# Patient Record
Sex: Female | Born: 1945 | Race: White | Hispanic: No | Marital: Married | Smoking: Never smoker
Health system: Southern US, Community
[De-identification: ages and names within clinical notes are randomized; demographics above are authoritative.]

## PROBLEM LIST (undated history)

## (undated) DIAGNOSIS — K219 Gastro-esophageal reflux disease without esophagitis: Secondary | ICD-10-CM

## (undated) DIAGNOSIS — I10 Essential (primary) hypertension: Secondary | ICD-10-CM

## (undated) DIAGNOSIS — R42 Dizziness and giddiness: Secondary | ICD-10-CM

## (undated) DIAGNOSIS — C801 Malignant (primary) neoplasm, unspecified: Secondary | ICD-10-CM

## (undated) DIAGNOSIS — H9190 Unspecified hearing loss, unspecified ear: Secondary | ICD-10-CM

## (undated) DIAGNOSIS — M1990 Unspecified osteoarthritis, unspecified site: Secondary | ICD-10-CM

## (undated) DIAGNOSIS — J302 Other seasonal allergic rhinitis: Secondary | ICD-10-CM

## (undated) DIAGNOSIS — K52839 Microscopic colitis, unspecified: Secondary | ICD-10-CM

## (undated) DIAGNOSIS — E785 Hyperlipidemia, unspecified: Secondary | ICD-10-CM

## (undated) DIAGNOSIS — J339 Nasal polyp, unspecified: Secondary | ICD-10-CM

## (undated) DIAGNOSIS — E109 Type 1 diabetes mellitus without complications: Secondary | ICD-10-CM

## (undated) DIAGNOSIS — K2270 Barrett's esophagus without dysplasia: Secondary | ICD-10-CM

## (undated) DIAGNOSIS — Z974 Presence of external hearing-aid: Secondary | ICD-10-CM

## (undated) DIAGNOSIS — IMO0001 Reserved for inherently not codable concepts without codable children: Secondary | ICD-10-CM

## (undated) DIAGNOSIS — N941 Unspecified dyspareunia: Secondary | ICD-10-CM

## (undated) DIAGNOSIS — Z780 Asymptomatic menopausal state: Secondary | ICD-10-CM

## (undated) DIAGNOSIS — R319 Hematuria, unspecified: Secondary | ICD-10-CM

## (undated) DIAGNOSIS — J329 Chronic sinusitis, unspecified: Secondary | ICD-10-CM

## (undated) HISTORY — PX: COLONOSCOPY: SHX174

## (undated) HISTORY — PX: HERNIA REPAIR: SHX51

## (undated) HISTORY — PX: TONSILLECTOMY: SUR1361

## (undated) HISTORY — PX: TUBAL LIGATION: SHX77

## (undated) HISTORY — PX: EYE SURGERY: SHX253

## (undated) HISTORY — PX: CATARACT EXTRACTION: SUR2

## (undated) HISTORY — PX: JOINT REPLACEMENT: SHX530

---

## 2003-12-03 ENCOUNTER — Ambulatory Visit: Payer: Self-pay | Admitting: Internal Medicine

## 2004-01-23 ENCOUNTER — Emergency Department: Payer: Self-pay | Admitting: Emergency Medicine

## 2004-03-15 ENCOUNTER — Ambulatory Visit: Payer: Self-pay

## 2004-12-28 ENCOUNTER — Ambulatory Visit: Payer: Self-pay | Admitting: Internal Medicine

## 2005-02-02 ENCOUNTER — Ambulatory Visit: Payer: Self-pay | Admitting: Ophthalmology

## 2005-12-29 ENCOUNTER — Ambulatory Visit: Payer: Self-pay | Admitting: Internal Medicine

## 2006-06-30 ENCOUNTER — Ambulatory Visit: Payer: Self-pay | Admitting: Internal Medicine

## 2007-02-01 ENCOUNTER — Ambulatory Visit: Payer: Self-pay | Admitting: Internal Medicine

## 2007-03-21 ENCOUNTER — Ambulatory Visit: Payer: Self-pay | Admitting: Unknown Physician Specialty

## 2007-11-13 DIAGNOSIS — C4491 Basal cell carcinoma of skin, unspecified: Secondary | ICD-10-CM

## 2007-11-13 HISTORY — DX: Basal cell carcinoma of skin, unspecified: C44.91

## 2008-04-22 ENCOUNTER — Ambulatory Visit: Payer: Self-pay | Admitting: Internal Medicine

## 2009-04-07 DIAGNOSIS — D239 Other benign neoplasm of skin, unspecified: Secondary | ICD-10-CM

## 2009-04-07 HISTORY — DX: Other benign neoplasm of skin, unspecified: D23.9

## 2009-04-23 ENCOUNTER — Ambulatory Visit: Payer: Self-pay | Admitting: Internal Medicine

## 2010-04-26 ENCOUNTER — Ambulatory Visit: Payer: Self-pay | Admitting: Internal Medicine

## 2011-05-03 ENCOUNTER — Ambulatory Visit: Payer: Self-pay | Admitting: Internal Medicine

## 2012-05-03 ENCOUNTER — Ambulatory Visit: Payer: Self-pay

## 2012-05-18 ENCOUNTER — Ambulatory Visit: Payer: Self-pay

## 2012-05-25 ENCOUNTER — Ambulatory Visit: Payer: Self-pay

## 2012-06-22 ENCOUNTER — Ambulatory Visit: Payer: Self-pay | Admitting: Unknown Physician Specialty

## 2012-12-11 ENCOUNTER — Ambulatory Visit: Payer: Self-pay | Admitting: Anesthesiology

## 2012-12-12 ENCOUNTER — Ambulatory Visit: Payer: Self-pay | Admitting: Otolaryngology

## 2013-07-03 ENCOUNTER — Ambulatory Visit: Payer: Self-pay

## 2014-07-11 ENCOUNTER — Ambulatory Visit: Payer: Medicare Other | Admitting: Anesthesiology

## 2014-07-11 ENCOUNTER — Ambulatory Visit
Admission: RE | Admit: 2014-07-11 | Discharge: 2014-07-11 | Disposition: A | Discharge status: Home / Self Care | Payer: Medicare Other | Source: Ambulatory Visit | Attending: Unknown Physician Specialty | Admitting: Unknown Physician Specialty

## 2014-07-11 ENCOUNTER — Encounter
Admission: RE | Disposition: A | Discharge status: Home / Self Care | Payer: Self-pay | Source: Ambulatory Visit | Attending: Unknown Physician Specialty

## 2014-07-11 ENCOUNTER — Encounter: Payer: Self-pay | Admitting: *Deleted

## 2014-07-11 DIAGNOSIS — K319 Disease of stomach and duodenum, unspecified: Secondary | ICD-10-CM | POA: Insufficient documentation

## 2014-07-11 DIAGNOSIS — E109 Type 1 diabetes mellitus without complications: Secondary | ICD-10-CM | POA: Insufficient documentation

## 2014-07-11 DIAGNOSIS — K2270 Barrett's esophagus without dysplasia: Secondary | ICD-10-CM | POA: Insufficient documentation

## 2014-07-11 DIAGNOSIS — K2980 Duodenitis without bleeding: Secondary | ICD-10-CM | POA: Diagnosis not present

## 2014-07-11 DIAGNOSIS — Z794 Long term (current) use of insulin: Secondary | ICD-10-CM | POA: Insufficient documentation

## 2014-07-11 HISTORY — PX: ESOPHAGOGASTRODUODENOSCOPY: SHX5428

## 2014-07-11 LAB — GLUCOSE, CAPILLARY: Glucose-Capillary: 213 mg/dL — ABNORMAL HIGH (ref 65–99)

## 2014-07-11 SURGERY — EGD (ESOPHAGOGASTRODUODENOSCOPY)
Anesthesia: General

## 2014-07-11 MED ORDER — LIDOCAINE HCL (PF) 2 % IJ SOLN
INTRAMUSCULAR | Status: DC | PRN
  Administered 2014-07-11: 50 mg

## 2014-07-11 MED ORDER — SODIUM CHLORIDE 0.9 % IV SOLN
INTRAVENOUS | Status: DC
  Administered 2014-07-11: 1000 mL via INTRAVENOUS

## 2014-07-11 MED ORDER — PROPOFOL 10 MG/ML IV BOLUS
INTRAVENOUS | Status: DC | PRN
  Administered 2014-07-11: 50 mg via INTRAVENOUS

## 2014-07-11 MED ORDER — EPHEDRINE SULFATE 50 MG/ML IJ SOLN
INTRAMUSCULAR | Status: DC | PRN
  Administered 2014-07-11: 10 mg via INTRAVENOUS
  Administered 2014-07-11: 15 mg via INTRAVENOUS

## 2014-07-11 MED ORDER — GLYCOPYRROLATE 0.2 MG/ML IJ SOLN
INTRAMUSCULAR | Status: DC | PRN
  Administered 2014-07-11: 0.2 mg via INTRAVENOUS

## 2014-07-11 MED ORDER — PROPOFOL INFUSION 10 MG/ML OPTIME
INTRAVENOUS | Status: DC | PRN
  Administered 2014-07-11: 150 ug/kg/min via INTRAVENOUS

## 2014-07-11 MED ORDER — FENTANYL CITRATE (PF) 100 MCG/2ML IJ SOLN
INTRAMUSCULAR | Status: DC | PRN
  Administered 2014-07-11: 50 ug via INTRAVENOUS

## 2014-07-11 MED ORDER — SODIUM CHLORIDE 0.9 % IV SOLN: INTRAVENOUS | Status: DC

## 2014-07-11 MED ORDER — ESMOLOL HCL 10 MG/ML IV SOLN
INTRAVENOUS | Status: DC | PRN
  Administered 2014-07-11: 20 ug via INTRAVENOUS

## 2014-07-11 NOTE — Anesthesia Postprocedure Evaluation (Signed)
  Anesthesia Post-op Note  Patient: Cassandra Joyce  Procedure(s) Performed: Procedure(s): ESOPHAGOGASTRODUODENOSCOPY (EGD) (N/A)  Anesthesia type:General  Patient location: PACU  Post pain: Pain level controlled  Post assessment: Post-op Vital signs reviewed, Patient's Cardiovascular Status Stable, Respiratory Function Stable, Patent Airway and No signs of Nausea or vomiting  Post vital signs: Reviewed and stable  Last Vitals:  Filed Vitals:   07/11/14 0901  BP: 174/78  Pulse: 116  Temp: 35.9 C  Resp: 12    Level of consciousness: awake, alert  and patient cooperative  Complications: No apparent anesthesia complications

## 2014-07-11 NOTE — Anesthesia Preprocedure Evaluation (Signed)
Anesthesia Evaluation  Patient identified by MRN, date of birth, ID band Patient awake    Reviewed: Allergy & Precautions, NPO status , Patient's Chart, lab work & pertinent test results  Airway Mallampati: II  TM Distance: >3 FB Neck ROM: Limited    Dental  (+) Teeth Intact   Pulmonary  breath sounds clear to auscultation  Pulmonary exam normal       Cardiovascular Exercise Tolerance: Good Normal cardiovascular examRhythm:Regular Rate:Normal     Neuro/Psych    GI/Hepatic Hx Barret's esophagus.   Endo/Other  diabetes, Well Controlled, Type 1, Insulin Dependent  Renal/GU      Musculoskeletal   Abdominal (+)  Abdomen: soft.    Peds  Hematology   Anesthesia Other Findings   Reproductive/Obstetrics                             Anesthesia Physical Anesthesia Plan  ASA: IV  Anesthesia Plan: General   Post-op Pain Management:    Induction: Intravenous  Airway Management Planned: Nasal Cannula  Additional Equipment:   Intra-op Plan:   Post-operative Plan:   Informed Consent: I have reviewed the patients History and Physical, chart, labs and discussed the procedure including the risks, benefits and alternatives for the proposed anesthesia with the patient or authorized representative who has indicated his/her understanding and acceptance.     Plan Discussed with: CRNA  Anesthesia Plan Comments:         Anesthesia Quick Evaluation

## 2014-07-11 NOTE — H&P (Signed)
Primary Care Physician:  Adrian Prows, MD Primary Gastroenterologist:  Dr. Vira Agar  Pre-Procedure History & Physical: HPI:  Cassandra Joyce is a 69 y.o. female is here for an endoscopy.   Past Medical History  Diagnosis Date  . Diabetes mellitus without complication     No past surgical history on file.  Prior to Admission medications   Medication Sig Start Date End Date Taking? Authorizing Provider  cholecalciferol (VITAMIN D) 400 UNITS TABS tablet Take 1,000 Units by mouth daily.   Yes Historical Provider, MD  enalapril (VASOTEC) 2.5 MG tablet Take 2.5 mg by mouth.   Yes Historical Provider, MD  fluticasone (FLONASE) 50 MCG/ACT nasal spray Place 2 sprays into both nostrils daily.   Yes Historical Provider, MD  insulin lispro (HUMALOG) 100 UNIT/ML injection Inject 35 Units into the skin once.   Yes Historical Provider, MD  omeprazole (PRILOSEC) 40 MG capsule Take 40 mg by mouth daily.   Yes Historical Provider, MD  simvastatin (ZOCOR) 40 MG tablet Take 40 mg by mouth daily.   Yes Historical Provider, MD    Allergies as of 05/26/2014  . (Not on File)    No family history on file.  History   Social History  . Marital Status: Married    Spouse Name: N/A  . Number of Children: N/A  . Years of Education: N/A   Occupational History  . Not on file.   Social History Main Topics  . Smoking status: Not on file  . Smokeless tobacco: Not on file  . Alcohol Use: Not on file  . Drug Use: Not on file  . Sexual Activity: Not on file   Other Topics Concern  . Not on file   Social History Narrative  . No narrative on file    Review of Systems: See HPI, otherwise negative ROS  Physical Exam: BP 144/90 mmHg  Pulse 59  Temp(Src) 96.9 F (36.1 C) (Tympanic)  Ht 5\' 4"  (1.626 m)  Wt 58.968 kg (130 lb)  BMI 22.30 kg/m2  SpO2 100% General:   Alert,  pleasant and cooperative in NAD Head:  Normocephalic and atraumatic. Neck:  Supple; no masses or thyromegaly. Lungs:   Clear throughout to auscultation.    Heart:  Regular rate and rhythm. Abdomen:  Soft, nontender and nondistended. Normal bowel sounds, without guarding, and without rebound.   Neurologic:  Alert and  oriented x4;  grossly normal neurologically.  Impression/Plan: Cassandra Joyce is here for an endoscopy to be performed for Barretts esophagus  Risks, benefits, limitations, and alternatives regarding  endoscopy have been reviewed with the patient.  Questions have been answered.  All parties agreeable.   Cassandra Cheers, MD  07/11/2014, 8:38 AM   Primary Care Physician:  Adrian Prows, MD Primary Gastroenterologist:  Dr. Vira Agar  Pre-Procedure History & Physical: HPI:  Cassandra Joyce is a 69 y.o. female is here for an endoscopy.   Past Medical History  Diagnosis Date  . Diabetes mellitus without complication     No past surgical history on file.  Prior to Admission medications   Medication Sig Start Date End Date Taking? Authorizing Provider  cholecalciferol (VITAMIN D) 400 UNITS TABS tablet Take 1,000 Units by mouth daily.   Yes Historical Provider, MD  enalapril (VASOTEC) 2.5 MG tablet Take 2.5 mg by mouth.   Yes Historical Provider, MD  fluticasone (FLONASE) 50 MCG/ACT nasal spray Place 2 sprays into both nostrils daily.   Yes Historical Provider, MD  insulin lispro (  HUMALOG) 100 UNIT/ML injection Inject 35 Units into the skin once.   Yes Historical Provider, MD  omeprazole (PRILOSEC) 40 MG capsule Take 40 mg by mouth daily.   Yes Historical Provider, MD  simvastatin (ZOCOR) 40 MG tablet Take 40 mg by mouth daily.   Yes Historical Provider, MD    Allergies as of 05/26/2014  . (Not on File)    No family history on file.  History   Social History  . Marital Status: Married    Spouse Name: N/A  . Number of Children: N/A  . Years of Education: N/A   Occupational History  . Not on file.   Social History Main Topics  . Smoking status: Not on file  . Smokeless  tobacco: Not on file  . Alcohol Use: Not on file  . Drug Use: Not on file  . Sexual Activity: Not on file   Other Topics Concern  . Not on file   Social History Narrative  . No narrative on file    Review of Systems: See HPI, otherwise negative ROS  Physical Exam: BP 144/90 mmHg  Pulse 59  Temp(Src) 96.9 F (36.1 C) (Tympanic)  Ht 5\' 4"  (1.626 m)  Wt 58.968 kg (130 lb)  BMI 22.30 kg/m2  SpO2 100% General:   Alert,  pleasant and cooperative in NAD Head:  Normocephalic and atraumatic. Neck:  Supple; no masses or thyromegaly. Lungs:  Clear throughout to auscultation.    Heart:  Regular rate and rhythm. Abdomen:  Soft, nontender and nondistended. Normal bowel sounds, without guarding, and without rebound.   Neurologic:  Alert and  oriented x4;  grossly normal neurologically.  Impression/Plan: Cassandra Joyce is here for an endoscopy to be performed for Barretts esophagus.  Risks, benefits, limitations, and alternatives regarding  endoscopy have been reviewed with the patient.  Questions have been answered.  All parties agreeable.   Cassandra Cheers, MD  07/11/2014, 8:38 AM

## 2014-07-11 NOTE — Op Note (Signed)
[facility] Gastroenterology Patient Name: Cassandra Joyce Procedure Date: 07/11/2014 8:44 AM MRN: [id number] Account #: [id number] Date of Birth: 06/09/1945 Admit Type: Outpatient Age: 69 Room: [facility] ENDO ROOM 1 Gender: Female Note Status: Finalized Procedure:         Upper GI endoscopy Indications:       Follow-up of Barrett's esophagus Providers:         Manya Silvas, MD Referring MD:      Youlanda Roys. Ola Spurr, MD (Referring MD) Medicines:         Propofol per Anesthesia Complications:     No immediate complications. Procedure:         Pre-Anesthesia Assessment:                    - After reviewing the risks and benefits, the patient was                     deemed in satisfactory condition to undergo the procedure.                    After obtaining informed consent, the endoscope was passed                     under direct vision. Throughout the procedure, the                     patient's blood pressure, pulse, and oxygen saturations                     were monitored continuously. The Endoscope was introduced                     through the mouth, and advanced to the second part of                     duodenum. The upper GI endoscopy was accomplished without                     difficulty. The patient tolerated the procedure well. Findings:      There were esophageal mucosal changes secondary to established       short-segment Barrett's disease present at the lower esophageal       sphincter. The maximum longitudinal extent of these mucosal changes was       2 cm in length. Mucosa was biopsied with a cold forceps for histology.       One specimen bottle was sent to pathology.      Diffuse minimal inflammation characterized by erythema and granularity       was found in the gastric body and in the gastric antrum. Biopsies were       taken with a cold forceps for histology. Biopsies were taken with a cold       forceps for Helicobacter pylori testing.      A  single small sessile polyp with no bleeding and no stigmata of recent       bleeding was found in the gastric body. Biopsies were taken with a cold       forceps for histology.      Patchy mild inflammation characterized by erythema and granularity was       found in the duodenal bulb. Biopsies were taken with a cold forceps for       histology. Impression:        -  Esophageal mucosal changes secondary to established                     short-segment Barrett's disease. Biopsied.                    - Gastritis. Biopsied.                    - A single gastric polyp. Biopsied.                    - Duodenitis. Biopsied. Recommendation:    - Await pathology results. Manya Silvas, MD 07/11/2014 9:00:10 AM This report has been signed electronically. Number of Addenda: 0 Note Initiated On: 07/11/2014 8:44 AM      [facility]

## 2014-07-11 NOTE — Transfer of Care (Signed)
Immediate Anesthesia Transfer of Care Note  Patient: Cassandra Joyce  Procedure(s) Performed: Procedure(s): ESOPHAGOGASTRODUODENOSCOPY (EGD) (N/A)  Patient Location: PACU  Anesthesia Type:General  Level of Consciousness: sedated  Airway & Oxygen Therapy: Patient Spontanous Breathing and Patient connected to nasal cannula oxygen  Post-op Assessment: Report given to RN and Post -op Vital signs reviewed and stable  Post vital signs: Reviewed and stable  Last Vitals:  Filed Vitals:   07/11/14 0901  BP: 174/78  Pulse: 116  Temp: 35.9 C  Resp: 12    Complications: No apparent anesthesia complications

## 2014-07-14 ENCOUNTER — Encounter: Payer: Self-pay | Admitting: Unknown Physician Specialty

## 2014-07-15 ENCOUNTER — Other Ambulatory Visit: Payer: Self-pay | Admitting: Infectious Diseases

## 2014-07-15 DIAGNOSIS — Z1231 Encounter for screening mammogram for malignant neoplasm of breast: Secondary | ICD-10-CM

## 2014-07-16 ENCOUNTER — Ambulatory Visit
Admission: RE | Admit: 2014-07-16 | Discharge: 2014-07-16 | Disposition: A | Discharge status: Home / Self Care | Payer: Medicare Other | Source: Ambulatory Visit | Attending: Infectious Diseases | Admitting: Infectious Diseases

## 2014-07-16 DIAGNOSIS — Z1231 Encounter for screening mammogram for malignant neoplasm of breast: Secondary | ICD-10-CM | POA: Insufficient documentation

## 2014-07-16 LAB — SURGICAL PATHOLOGY

## 2015-01-25 ENCOUNTER — Emergency Department
Admission: EM | Admit: 2015-01-25 | Discharge: 2015-01-25 | Disposition: A | Discharge status: Home / Self Care | Payer: Medicare Other | Attending: Emergency Medicine | Admitting: Emergency Medicine

## 2015-01-25 ENCOUNTER — Emergency Department: Payer: Medicare Other

## 2015-01-25 DIAGNOSIS — Z79899 Other long term (current) drug therapy: Secondary | ICD-10-CM | POA: Insufficient documentation

## 2015-01-25 DIAGNOSIS — Z880 Allergy status to penicillin: Secondary | ICD-10-CM | POA: Diagnosis not present

## 2015-01-25 DIAGNOSIS — Z794 Long term (current) use of insulin: Secondary | ICD-10-CM | POA: Diagnosis not present

## 2015-01-25 DIAGNOSIS — R112 Nausea with vomiting, unspecified: Secondary | ICD-10-CM | POA: Diagnosis not present

## 2015-01-25 DIAGNOSIS — E119 Type 2 diabetes mellitus without complications: Secondary | ICD-10-CM | POA: Insufficient documentation

## 2015-01-25 LAB — COMPREHENSIVE METABOLIC PANEL
ALT: 15 U/L (ref 14–54)
AST: 20 U/L (ref 15–41)
Albumin: 4.2 g/dL (ref 3.5–5.0)
Alkaline Phosphatase: 64 U/L (ref 38–126)
Anion gap: 8 (ref 5–15)
BUN: 22 mg/dL — AB (ref 6–20)
CHLORIDE: 101 mmol/L (ref 101–111)
CO2: 27 mmol/L (ref 22–32)
CREATININE: 0.94 mg/dL (ref 0.44–1.00)
Calcium: 9.7 mg/dL (ref 8.9–10.3)
GFR calc Af Amer: >60 mL/min (ref >60)
GFR calc non Af Amer: 60 mL/min — ABNORMAL LOW (ref >60)
Glucose, Bld: 242 mg/dL — ABNORMAL HIGH (ref 65–99)
POTASSIUM: 4.4 mmol/L (ref 3.5–5.1)
SODIUM: 136 mmol/L (ref 135–145)
Total Bilirubin: 1.1 mg/dL (ref 0.3–1.2)
Total Protein: 6.8 g/dL (ref 6.5–8.1)

## 2015-01-25 LAB — CBC
HEMATOCRIT: 39.2 % (ref 35.0–47.0)
Hemoglobin: 13 g/dL (ref 12.0–16.0)
MCH: 28.6 pg (ref 26.0–34.0)
MCHC: 33.1 g/dL (ref 32.0–36.0)
MCV: 86.6 fL (ref 80.0–100.0)
Platelets: 155 10*3/uL (ref 150–440)
RBC: 4.53 MIL/uL (ref 3.80–5.20)
RDW: 13.6 % (ref 11.5–14.5)
WBC: 6.3 10*3/uL (ref 3.6–11.0)

## 2015-01-25 LAB — URINALYSIS COMPLETE WITH MICROSCOPIC (ARMC ONLY)
BILIRUBIN URINE: NEGATIVE
GLUCOSE, UA: NEGATIVE mg/dL
LEUKOCYTES UA: NEGATIVE
Nitrite: NEGATIVE
PH: 5 (ref 5.0–8.0)
Protein, ur: 30 mg/dL — AB
Specific Gravity, Urine: 1.024 (ref 1.005–1.030)

## 2015-01-25 LAB — GLUCOSE, CAPILLARY: GLUCOSE-CAPILLARY: 213 mg/dL — AB (ref 65–99)

## 2015-01-25 LAB — LIPASE, BLOOD: LIPASE: 20 U/L (ref 11–51)

## 2015-01-25 LAB — TROPONIN I: Troponin I: <0.03 ng/mL (ref <0.031)

## 2015-01-25 MED ORDER — SODIUM CHLORIDE 0.9 % IV BOLUS (SEPSIS)
1000.0000 mL | Freq: Once | INTRAVENOUS | Status: AC
  Administered 2015-01-25: 1000 mL via INTRAVENOUS

## 2015-01-25 MED ORDER — ONDANSETRON 4 MG PO TBDP
4.0000 mg | ORAL_TABLET | Freq: Once | ORAL | Status: AC
  Administered 2015-01-25: 4 mg via ORAL
  Filled 2015-01-25: qty 1

## 2015-01-25 MED ORDER — ONDANSETRON HCL 4 MG PO TABS: 4.0000 mg | ORAL_TABLET | Freq: Every day | ORAL | Status: DC | PRN

## 2015-01-25 NOTE — ED Provider Notes (Signed)
[facility] Emergency Department Provider Note  Time seen: 9:58 PM  I have reviewed the triage vital signs and the nursing notes.   HISTORY  Chief Complaint Abdominal Pain and Emesis    HPI Cassandra Joyce is a 70 y.o. female with a past medical history of diabetes who presents the emergency department with nausea and vomiting. According to the patient around 9 AM this morning she began feeling nauseated with several episodes of vomiting. States she cannot keep down any liquid. Try to drink some ginger ale tonight and vomited once again so she came to the emergency department for evaluation. Patient denies any abdominal pain. Denies any diarrhea. States she did normal bowel movement today. Denies dysuria. Denies any abdominal pain now or at any time. States her nausea is very mild at this time after receiving ODT Zofran in triage.     Past Medical History  Diagnosis Date  . Diabetes mellitus without complication     There are no active problems to display for this patient.   Past Surgical History  Procedure Laterality Date  . Esophagogastroduodenoscopy N/A 07/11/2014    Procedure: ESOPHAGOGASTRODUODENOSCOPY (EGD);  Surgeon: Manya Silvas, MD;  Location: [facility] ENDOSCOPY;  Service: Endoscopy;  Laterality: N/A;    Current Outpatient Rx  Name  Route  Sig  Dispense  Refill  . cholecalciferol (VITAMIN D) 400 UNITS TABS tablet   Oral   Take 1,000 Units by mouth daily.         . enalapril (VASOTEC) 2.5 MG tablet   Oral   Take 2.5 mg by mouth.         . fluticasone (FLONASE) 50 MCG/ACT nasal spray   Each Nare   Place 2 sprays into both nostrils daily.         . insulin lispro (HUMALOG) 100 UNIT/ML injection   Subcutaneous   Inject 35 Units into the skin once.         Marland Kitchen omeprazole (PRILOSEC) 40 MG capsule   Oral   Take 40 mg by mouth daily.         . simvastatin (ZOCOR) 40 MG tablet   Oral   Take 40 mg by mouth daily.            Allergies Augmentin; Mercury; Penicillin g; and Sulfa antibiotics  No family history on file.  Social History Social History  Substance Use Topics  . Smoking status: Not on file  . Smokeless tobacco: Not on file  . Alcohol Use: Not on file    Review of Systems Constitutional: Negative for fever Cardiovascular: Negative for chest pain. Respiratory: Negative for shortness of breath. Gastrointestinal: Negative for abdominal pain. Positive nausea and vomiting. Negative for diarrhea or constipation Genitourinary: Negative for dysuria. Musculoskeletal: Negative for back pain. Neurological: Negative for headache 10-point ROS otherwise negative.  ____________________________________________   PHYSICAL EXAM:  VITAL SIGNS: ED Triage Vitals  Enc Vitals Group     BP 01/25/15 1959 141/82 mmHg     Pulse Rate 01/25/15 1959 73     Resp 01/25/15 1959 18     Temp 01/25/15 1959 97.7 F (36.5 C)     Temp Source 01/25/15 1959 Oral     SpO2 01/25/15 1959 98 %     Weight 01/25/15 1959 126 lb (57.153 kg)     Height 01/25/15 1959 5\' 5"  (1.651 m)     Head Cir --      Peak Flow --  Pain Score 01/25/15 1959 0     Pain Loc --      Pain Edu? --      Excl. in [place]? --     Constitutional: Alert and oriented. Well appearing and in no distress. Eyes: Normal exam ENT   Head: Normocephalic and atraumatic.   Mouth/Throat: Mucous membranes are moist. Cardiovascular: Normal rate, regular rhythm. No murmur Respiratory: Normal respiratory effort without tachypnea nor retractions. Breath sounds are clear and equal bilaterally. No wheezes/rales/rhonchi. Gastrointestinal: Soft and nontender. No distention.   Musculoskeletal: Nontender with normal range of motion in all extremities. Neurologic:  Normal speech and language. No gross focal neurologic deficits Skin:  Skin is warm, dry and intact.  Psychiatric: Mood and affect are normal. Speech and behavior are normal.    ____________________________________________    EKG  EKG reviewed and interpreted by myself shows normal sinus rhythm at 71 bpm, narrow QRS, normal axis, normal intervals, nonspecific ST changes. No ST elevations.  ____________________________________________    RADIOLOGY  X-ray within normal limits.  ____________________________________________    INITIAL IMPRESSION / ASSESSMENT AND PLAN / ED COURSE  Pertinent labs & imaging results that were available during my care of the patient were reviewed by me and considered in my medical decision making (see chart for details).  Patient presents with nausea and vomiting. Denies abdominal pain. States she cannot keep down liquids today so she came to the emergency department for evaluation. Denies diarrhea or constipation. Overall appears very well. Received 4 mg ODT Zofran in triage and states her nausea is nearly gone at this time. We will dose IV fluids, attempt to by mouth hydrate, and obtain a three-way abdominal x-ray to rule out any obstructive pathology, very low suspicion.  X-ray normal. Patient continues to feel very well. We will let the rest of fluids infused and send the patient home on ODT Zofran. I discussed strict return precautions to which she is agreeable. ____________________________________________   FINAL CLINICAL IMPRESSION(S) / ED DIAGNOSES  Nausea and vomiting   Harvest Dark, MD 01/25/15 2241

## 2015-01-25 NOTE — Discharge Instructions (Signed)

## 2015-01-25 NOTE — ED Notes (Signed)
Pt c/o abd pain last night and this am; has been vomiting since last night, last time just pta; cannot keep any liquids down; pt is diabetic; has had to be admitted before for dehydration due to vomiting

## 2015-03-26 ENCOUNTER — Emergency Department: Payer: Medicare Other

## 2015-03-26 ENCOUNTER — Inpatient Hospital Stay: Payer: Medicare Other

## 2015-03-26 ENCOUNTER — Inpatient Hospital Stay: Payer: Medicare Other | Admitting: Certified Registered Nurse Anesthetist

## 2015-03-26 ENCOUNTER — Encounter
Admission: EM | Disposition: A | Discharge status: Skilled Nursing Facility | Payer: Self-pay | Source: Home / Self Care | Attending: Specialist

## 2015-03-26 ENCOUNTER — Inpatient Hospital Stay
Admission: EM | Admit: 2015-03-26 | Discharge: 2015-03-29 | DRG: 470 | Disposition: A | Discharge status: Skilled Nursing Facility | Payer: Medicare Other | Attending: Specialist | Admitting: Specialist

## 2015-03-26 DIAGNOSIS — E109 Type 1 diabetes mellitus without complications: Secondary | ICD-10-CM | POA: Diagnosis present

## 2015-03-26 DIAGNOSIS — S72001A Fracture of unspecified part of neck of right femur, initial encounter for closed fracture: Secondary | ICD-10-CM | POA: Diagnosis present

## 2015-03-26 DIAGNOSIS — Y92009 Unspecified place in unspecified non-institutional (private) residence as the place of occurrence of the external cause: Secondary | ICD-10-CM | POA: Diagnosis not present

## 2015-03-26 DIAGNOSIS — Y929 Unspecified place or not applicable: Secondary | ICD-10-CM | POA: Diagnosis not present

## 2015-03-26 DIAGNOSIS — R110 Nausea: Secondary | ICD-10-CM | POA: Diagnosis not present

## 2015-03-26 DIAGNOSIS — W19XXXA Unspecified fall, initial encounter: Secondary | ICD-10-CM

## 2015-03-26 DIAGNOSIS — K5903 Drug induced constipation: Secondary | ICD-10-CM | POA: Diagnosis not present

## 2015-03-26 DIAGNOSIS — M25551 Pain in right hip: Secondary | ICD-10-CM | POA: Diagnosis present

## 2015-03-26 DIAGNOSIS — S72009A Fracture of unspecified part of neck of unspecified femur, initial encounter for closed fracture: Secondary | ICD-10-CM | POA: Diagnosis present

## 2015-03-26 DIAGNOSIS — Z794 Long term (current) use of insulin: Secondary | ICD-10-CM | POA: Diagnosis not present

## 2015-03-26 DIAGNOSIS — I10 Essential (primary) hypertension: Secondary | ICD-10-CM | POA: Diagnosis present

## 2015-03-26 DIAGNOSIS — Z96649 Presence of unspecified artificial hip joint: Secondary | ICD-10-CM

## 2015-03-26 DIAGNOSIS — Z882 Allergy status to sulfonamides status: Secondary | ICD-10-CM | POA: Diagnosis not present

## 2015-03-26 DIAGNOSIS — K5900 Constipation, unspecified: Secondary | ICD-10-CM | POA: Diagnosis not present

## 2015-03-26 DIAGNOSIS — Z9641 Presence of insulin pump (external) (internal): Secondary | ICD-10-CM | POA: Diagnosis present

## 2015-03-26 DIAGNOSIS — Z7982 Long term (current) use of aspirin: Secondary | ICD-10-CM

## 2015-03-26 DIAGNOSIS — G4700 Insomnia, unspecified: Secondary | ICD-10-CM | POA: Diagnosis not present

## 2015-03-26 DIAGNOSIS — W109XXA Fall (on) (from) unspecified stairs and steps, initial encounter: Secondary | ICD-10-CM | POA: Diagnosis present

## 2015-03-26 DIAGNOSIS — E785 Hyperlipidemia, unspecified: Secondary | ICD-10-CM | POA: Diagnosis present

## 2015-03-26 HISTORY — DX: Hyperlipidemia, unspecified: E78.5

## 2015-03-26 HISTORY — DX: Unspecified osteoarthritis, unspecified site: M19.90

## 2015-03-26 HISTORY — DX: Essential (primary) hypertension: I10

## 2015-03-26 HISTORY — DX: Type 1 diabetes mellitus without complications: E10.9

## 2015-03-26 HISTORY — PX: HIP ARTHROPLASTY: SHX981

## 2015-03-26 LAB — URINALYSIS COMPLETE WITH MICROSCOPIC (ARMC ONLY)
BILIRUBIN URINE: NEGATIVE
Bacteria, UA: NONE SEEN
Glucose, UA: 50 mg/dL — AB
KETONES UR: NEGATIVE mg/dL
LEUKOCYTES UA: NEGATIVE
NITRITE: NEGATIVE
PH: 5 (ref 5.0–8.0)
Protein, ur: NEGATIVE mg/dL
SPECIFIC GRAVITY, URINE: 1.021 (ref 1.005–1.030)

## 2015-03-26 LAB — CBC WITH DIFFERENTIAL/PLATELET
Basophils Absolute: 0 10*3/uL (ref 0–0.1)
Basophils Relative: 0 %
EOS ABS: 0.1 10*3/uL (ref 0–0.7)
Eosinophils Relative: 1 %
HCT: 36.3 % (ref 35.0–47.0)
Hemoglobin: 12.1 g/dL (ref 12.0–16.0)
LYMPHS ABS: 1.2 10*3/uL (ref 1.0–3.6)
LYMPHS PCT: 10 %
MCH: 28.9 pg (ref 26.0–34.0)
MCHC: 33.3 g/dL (ref 32.0–36.0)
MCV: 86.8 fL (ref 80.0–100.0)
MONOS PCT: 4 %
Monocytes Absolute: 0.4 10*3/uL (ref 0.2–0.9)
NEUTROS ABS: 10.3 10*3/uL — AB (ref 1.4–6.5)
NEUTROS PCT: 85 %
PLATELETS: 147 10*3/uL — AB (ref 150–440)
RBC: 4.18 MIL/uL (ref 3.80–5.20)
RDW: 13.5 % (ref 11.5–14.5)
WBC: 12.1 10*3/uL — AB (ref 3.6–11.0)

## 2015-03-26 LAB — ABO/RH: ABO/RH(D): O POS

## 2015-03-26 LAB — COMPREHENSIVE METABOLIC PANEL
ALT: 20 U/L (ref 14–54)
AST: 27 U/L (ref 15–41)
Albumin: 4 g/dL (ref 3.5–5.0)
Alkaline Phosphatase: 64 U/L (ref 38–126)
Anion gap: 8 (ref 5–15)
BUN: 23 mg/dL — ABNORMAL HIGH (ref 6–20)
CHLORIDE: 103 mmol/L (ref 101–111)
CO2: 24 mmol/L (ref 22–32)
CREATININE: 0.81 mg/dL (ref 0.44–1.00)
Calcium: 8.9 mg/dL (ref 8.9–10.3)
GFR calc non Af Amer: >60 mL/min (ref >60)
Glucose, Bld: 217 mg/dL — ABNORMAL HIGH (ref 65–99)
POTASSIUM: 3.8 mmol/L (ref 3.5–5.1)
SODIUM: 135 mmol/L (ref 135–145)
Total Bilirubin: 0.5 mg/dL (ref 0.3–1.2)
Total Protein: 6.5 g/dL (ref 6.5–8.1)

## 2015-03-26 LAB — GLUCOSE, CAPILLARY
Glucose-Capillary: 222 mg/dL — ABNORMAL HIGH (ref 65–99)
Glucose-Capillary: 222 mg/dL — ABNORMAL HIGH (ref 65–99)
Glucose-Capillary: 311 mg/dL — ABNORMAL HIGH (ref 65–99)
Glucose-Capillary: 275 mg/dL — ABNORMAL HIGH (ref 65–99)
Glucose-Capillary: 349 mg/dL — ABNORMAL HIGH (ref 65–99)
Glucose-Capillary: 387 mg/dL — ABNORMAL HIGH (ref 65–99)
Glucose-Capillary: 365 mg/dL — ABNORMAL HIGH (ref 65–99)
Glucose-Capillary: 230 mg/dL — ABNORMAL HIGH (ref 65–99)
Glucose-Capillary: 156 mg/dL — ABNORMAL HIGH (ref 65–99)
Glucose-Capillary: 271 mg/dL — ABNORMAL HIGH (ref 65–99)

## 2015-03-26 LAB — TYPE AND SCREEN
ABO/RH(D): O POS
ANTIBODY SCREEN: NEGATIVE

## 2015-03-26 LAB — PROTIME-INR
INR: 1.21
Prothrombin Time: 15.5 seconds — ABNORMAL HIGH (ref 11.4–15.0)

## 2015-03-26 LAB — HEMOGLOBIN A1C: Hgb A1c MFr Bld: 6.8 % — ABNORMAL HIGH (ref 4.0–6.0)

## 2015-03-26 LAB — APTT: APTT: 30 s (ref 24–36)

## 2015-03-26 LAB — ALBUMIN: ALBUMIN: 3.8 g/dL (ref 3.5–5.0)

## 2015-03-26 LAB — TROPONIN I: Troponin I: <0.03 ng/mL (ref <0.031)

## 2015-03-26 LAB — TSH: TSH: 4.903 u[IU]/mL — ABNORMAL HIGH (ref 0.350–4.500)

## 2015-03-26 LAB — CALCIUM: CALCIUM: 8.6 mg/dL — AB (ref 8.9–10.3)

## 2015-03-26 SURGERY — HEMIARTHROPLASTY, HIP, DIRECT ANTERIOR APPROACH, FOR FRACTURE
Anesthesia: Regional | Laterality: Right

## 2015-03-26 MED ORDER — MORPHINE SULFATE (PF) 4 MG/ML IV SOLN
INTRAVENOUS | Status: DC | PRN
  Administered 2015-03-26: 4 mg via INTRAVENOUS

## 2015-03-26 MED ORDER — TRANEXAMIC ACID 1000 MG/10ML IV SOLN
1000.0000 mg | INTRAVENOUS | Status: AC
  Administered 2015-03-26: 1000 mg via INTRAVENOUS
  Filled 2015-03-26 (×2): qty 10

## 2015-03-26 MED ORDER — METHOCARBAMOL 1000 MG/10ML IJ SOLN: 500.0000 mg | Freq: Four times a day (QID) | INTRAVENOUS | Status: DC | PRN

## 2015-03-26 MED ORDER — SIMVASTATIN 40 MG PO TABS
40.0000 mg | ORAL_TABLET | Freq: Every day | ORAL | Status: DC
  Administered 2015-03-27 – 2015-03-28 (×2): 40 mg via ORAL
  Filled 2015-03-26 (×4): qty 1

## 2015-03-26 MED ORDER — ACETAMINOPHEN 650 MG RE SUPP: 650.0000 mg | Freq: Four times a day (QID) | RECTAL | Status: DC | PRN

## 2015-03-26 MED ORDER — CLINDAMYCIN PHOSPHATE 600 MG/50ML IV SOLN
600.0000 mg | Freq: Three times a day (TID) | INTRAVENOUS | Status: AC
  Administered 2015-03-26 – 2015-03-27 (×3): 600 mg via INTRAVENOUS
  Filled 2015-03-26 (×4): qty 50

## 2015-03-26 MED ORDER — METHOCARBAMOL 500 MG PO TABS
500.0000 mg | ORAL_TABLET | Freq: Four times a day (QID) | ORAL | Status: DC | PRN
  Filled 2015-03-26: qty 1

## 2015-03-26 MED ORDER — EPHEDRINE SULFATE 50 MG/ML IJ SOLN
INTRAMUSCULAR | Status: DC | PRN
  Administered 2015-03-26: 2.5 mg via INTRAVENOUS

## 2015-03-26 MED ORDER — DOCUSATE SODIUM 100 MG PO CAPS
100.0000 mg | ORAL_CAPSULE | Freq: Two times a day (BID) | ORAL | Status: DC
  Administered 2015-03-26 – 2015-03-29 (×6): 100 mg via ORAL
  Filled 2015-03-26 (×6): qty 1

## 2015-03-26 MED ORDER — KETOROLAC TROMETHAMINE 30 MG/ML IJ SOLN
INTRAMUSCULAR | Status: DC | PRN
  Administered 2015-03-26: 30 mg via INTRAVENOUS

## 2015-03-26 MED ORDER — INSULIN ASPART 100 UNIT/ML ~~LOC~~ SOLN: 0.0000 [IU] | Freq: Four times a day (QID) | SUBCUTANEOUS | Status: DC

## 2015-03-26 MED ORDER — PANTOPRAZOLE SODIUM 40 MG PO TBEC
40.0000 mg | DELAYED_RELEASE_TABLET | Freq: Every day | ORAL | Status: DC
  Administered 2015-03-27 – 2015-03-29 (×3): 40 mg via ORAL
  Filled 2015-03-26 (×3): qty 1

## 2015-03-26 MED ORDER — CLINDAMYCIN PHOSPHATE 600 MG/50ML IV SOLN
600.0000 mg | Freq: Once | INTRAVENOUS | Status: AC
  Administered 2015-03-26: 600 mg via INTRAVENOUS

## 2015-03-26 MED ORDER — BISACODYL 10 MG RE SUPP
10.0000 mg | Freq: Every day | RECTAL | Status: DC | PRN
  Administered 2015-03-29: 10 mg via RECTAL
  Filled 2015-03-26: qty 1

## 2015-03-26 MED ORDER — MORPHINE SULFATE (PF) 2 MG/ML IV SOLN: 2.0000 mg | INTRAVENOUS | Status: DC | PRN

## 2015-03-26 MED ORDER — SODIUM CHLORIDE 0.45 % IV SOLN: INTRAVENOUS | Status: DC

## 2015-03-26 MED ORDER — FLUTICASONE PROPIONATE 50 MCG/ACT NA SUSP
2.0000 | Freq: Every day | NASAL | Status: DC
  Administered 2015-03-27 – 2015-03-29 (×3): 2 via NASAL
  Filled 2015-03-26: qty 16

## 2015-03-26 MED ORDER — ACETAMINOPHEN 325 MG PO TABS: 650.0000 mg | ORAL_TABLET | Freq: Four times a day (QID) | ORAL | Status: DC | PRN

## 2015-03-26 MED ORDER — ONDANSETRON HCL 4 MG/2ML IJ SOLN
4.0000 mg | Freq: Four times a day (QID) | INTRAMUSCULAR | Status: DC | PRN
  Administered 2015-03-27 – 2015-03-29 (×2): 4 mg via INTRAVENOUS
  Filled 2015-03-26 (×2): qty 2

## 2015-03-26 MED ORDER — SODIUM CHLORIDE 0.9 % IV SOLN
10000.0000 ug | INTRAVENOUS | Status: DC | PRN
  Administered 2015-03-26: 50 ug/min via INTRAVENOUS

## 2015-03-26 MED ORDER — INSULIN ASPART 100 UNIT/ML ~~LOC~~ SOLN
10.0000 [IU] | Freq: Once | SUBCUTANEOUS | Status: AC
  Administered 2015-03-26: 10 [IU] via SUBCUTANEOUS

## 2015-03-26 MED ORDER — TRANEXAMIC ACID 1000 MG/10ML IV SOLN: 1000.0000 mg | INTRAVENOUS | Status: DC

## 2015-03-26 MED ORDER — ACETAMINOPHEN 10 MG/ML IV SOLN
INTRAVENOUS | Status: AC
  Filled 2015-03-26: qty 100

## 2015-03-26 MED ORDER — MEPERIDINE HCL 25 MG/ML IJ SOLN
12.5000 mg | Freq: Once | INTRAMUSCULAR | Status: AC
Start: 2015-03-26 — End: 2015-03-26
  Administered 2015-03-26: 12.5 mg via INTRAVENOUS

## 2015-03-26 MED ORDER — MORPHINE SULFATE (PF) 4 MG/ML IV SOLN
INTRAVENOUS | Status: AC
Start: 2015-03-26 — End: 2015-03-26
  Administered 2015-03-26: 4 mg via INTRAVENOUS
  Filled 2015-03-26: qty 1

## 2015-03-26 MED ORDER — ONDANSETRON HCL 4 MG PO TABS: 4.0000 mg | ORAL_TABLET | Freq: Every day | ORAL | Status: DC | PRN

## 2015-03-26 MED ORDER — SODIUM CHLORIDE 0.9 % IV SOLN
INTRAVENOUS | Status: DC
  Administered 2015-03-26 – 2015-03-27 (×3): via INTRAVENOUS

## 2015-03-26 MED ORDER — PROPOFOL 10 MG/ML IV BOLUS
INTRAVENOUS | Status: DC | PRN
  Administered 2015-03-26: 10 mg via INTRAVENOUS

## 2015-03-26 MED ORDER — PHENYLEPHRINE HCL 10 MG/ML IJ SOLN
INTRAMUSCULAR | Status: DC | PRN
  Administered 2015-03-26 (×2): 100 ug via INTRAVENOUS
  Administered 2015-03-26: 200 ug via INTRAVENOUS
  Administered 2015-03-26: 100 ug via INTRAVENOUS

## 2015-03-26 MED ORDER — ONDANSETRON HCL 4 MG PO TABS
4.0000 mg | ORAL_TABLET | Freq: Four times a day (QID) | ORAL | Status: DC | PRN
Start: 2015-03-26 — End: 2015-03-29

## 2015-03-26 MED ORDER — INSULIN REGULAR HUMAN 100 UNIT/ML IJ SOLN
2.0000 [IU] | Freq: Once | INTRAMUSCULAR | Status: AC
  Administered 2015-03-26: 2 [IU] via SUBCUTANEOUS

## 2015-03-26 MED ORDER — INSULIN PUMP
SUBCUTANEOUS | Status: DC
  Administered 2015-03-26: 20:00:00 via SUBCUTANEOUS
  Administered 2015-03-27: 1.8 via SUBCUTANEOUS
  Administered 2015-03-27: 1.2 via SUBCUTANEOUS
  Filled 2015-03-26: qty 1

## 2015-03-26 MED ORDER — ACETAMINOPHEN 10 MG/ML IV SOLN
INTRAVENOUS | Status: DC | PRN
  Administered 2015-03-26: 1000 mg via INTRAVENOUS

## 2015-03-26 MED ORDER — CEFAZOLIN SODIUM-DEXTROSE 2-4 GM/100ML-% IV SOLN
2.0000 g | Freq: Four times a day (QID) | INTRAVENOUS | Status: AC
  Administered 2015-03-26 (×2): 2 g via INTRAVENOUS
  Filled 2015-03-26 (×2): qty 100

## 2015-03-26 MED ORDER — METHOCARBAMOL 500 MG PO TABS
500.0000 mg | ORAL_TABLET | Freq: Four times a day (QID) | ORAL | Status: DC | PRN
  Administered 2015-03-27: 500 mg via ORAL
  Filled 2015-03-26: qty 1

## 2015-03-26 MED ORDER — CEFAZOLIN SODIUM-DEXTROSE 2-4 GM/100ML-% IV SOLN
2.0000 g | Freq: Once | INTRAVENOUS | Status: AC
  Administered 2015-03-26: 2 g via INTRAVENOUS
  Filled 2015-03-26: qty 100

## 2015-03-26 MED ORDER — SODIUM CHLORIDE 0.45 % IV SOLN
INTRAVENOUS | Status: DC
  Administered 2015-03-26: 08:00:00 via INTRAVENOUS

## 2015-03-26 MED ORDER — MORPHINE SULFATE (PF) 4 MG/ML IV SOLN
4.0000 mg | Freq: Once | INTRAVENOUS | Status: AC
Start: 2015-03-26 — End: 2015-03-26
  Administered 2015-03-26: 4 mg via INTRAVENOUS
  Filled 2015-03-26: qty 1

## 2015-03-26 MED ORDER — MORPHINE SULFATE (PF) 4 MG/ML IV SOLN
4.0000 mg | INTRAVENOUS | Status: DC | PRN
  Administered 2015-03-26: 4 mg via INTRAVENOUS

## 2015-03-26 MED ORDER — METOCLOPRAMIDE HCL 10 MG PO TABS: 5.0000 mg | ORAL_TABLET | Freq: Three times a day (TID) | ORAL | Status: DC | PRN

## 2015-03-26 MED ORDER — FERROUS SULFATE 325 (65 FE) MG PO TABS
325.0000 mg | ORAL_TABLET | Freq: Every day | ORAL | Status: DC
  Administered 2015-03-27 – 2015-03-29 (×3): 325 mg via ORAL
  Filled 2015-03-26 (×3): qty 1

## 2015-03-26 MED ORDER — INSULIN ASPART 100 UNIT/ML ~~LOC~~ SOLN
SUBCUTANEOUS | Status: AC
  Filled 2015-03-26: qty 2

## 2015-03-26 MED ORDER — MORPHINE SULFATE (PF) 2 MG/ML IV SOLN
2.0000 mg | Freq: Once | INTRAVENOUS | Status: AC
Start: 2015-03-26 — End: 2015-03-26
  Administered 2015-03-26: 2 mg via INTRAVENOUS
  Filled 2015-03-26: qty 1

## 2015-03-26 MED ORDER — ZOLPIDEM TARTRATE 5 MG PO TABS: 5.0000 mg | ORAL_TABLET | Freq: Every evening | ORAL | Status: DC | PRN

## 2015-03-26 MED ORDER — INSULIN ASPART 100 UNIT/ML ~~LOC~~ SOLN
SUBCUTANEOUS | Status: AC
Start: 2015-03-26 — End: 2015-03-26
  Administered 2015-03-26: 10 [IU] via SUBCUTANEOUS
  Filled 2015-03-26: qty 10

## 2015-03-26 MED ORDER — ENALAPRIL MALEATE 2.5 MG PO TABS
2.5000 mg | ORAL_TABLET | Freq: Every day | ORAL | Status: DC
  Administered 2015-03-27 – 2015-03-29 (×3): 2.5 mg via ORAL
  Filled 2015-03-26 (×4): qty 1

## 2015-03-26 MED ORDER — ONDANSETRON HCL 4 MG/2ML IJ SOLN
4.0000 mg | Freq: Once | INTRAMUSCULAR | Status: AC
  Administered 2015-03-26: 4 mg via INTRAVENOUS
  Filled 2015-03-26: qty 2

## 2015-03-26 MED ORDER — SODIUM CHLORIDE 0.9% FLUSH
3.0000 mL | Freq: Two times a day (BID) | INTRAVENOUS | Status: DC
  Administered 2015-03-27 – 2015-03-28 (×2): 3 mL via INTRAVENOUS

## 2015-03-26 MED ORDER — PHENOL 1.4 % MT LIQD: 1.0000 | OROMUCOSAL | Status: DC | PRN

## 2015-03-26 MED ORDER — FLEET ENEMA 7-19 GM/118ML RE ENEM: 1.0000 | ENEMA | Freq: Once | RECTAL | Status: DC | PRN

## 2015-03-26 MED ORDER — BUPIVACAINE-EPINEPHRINE (PF) 0.25% -1:200000 IJ SOLN
INTRAMUSCULAR | Status: DC | PRN
  Administered 2015-03-26: 20 mL via PERINEURAL
  Administered 2015-03-26: 30 mL via PERINEURAL

## 2015-03-26 MED ORDER — INSULIN ASPART 100 UNIT/ML ~~LOC~~ SOLN: 0.0000 [IU] | Freq: Every day | SUBCUTANEOUS | Status: DC

## 2015-03-26 MED ORDER — ENOXAPARIN SODIUM 30 MG/0.3ML ~~LOC~~ SOLN
30.0000 mg | SUBCUTANEOUS | Status: DC
  Administered 2015-03-27: 30 mg via SUBCUTANEOUS
  Filled 2015-03-26: qty 0.3

## 2015-03-26 MED ORDER — HYDROCODONE-ACETAMINOPHEN 5-325 MG PO TABS
1.0000 | ORAL_TABLET | Freq: Four times a day (QID) | ORAL | Status: DC | PRN
  Administered 2015-03-27: 1 via ORAL
  Administered 2015-03-27: 2 via ORAL
  Administered 2015-03-27 – 2015-03-28 (×3): 1 via ORAL
  Administered 2015-03-28: 2 via ORAL
  Administered 2015-03-28: 1 via ORAL
  Administered 2015-03-29: 2 via ORAL
  Filled 2015-03-26 (×4): qty 1
  Filled 2015-03-26 (×3): qty 2
  Filled 2015-03-26: qty 1

## 2015-03-26 MED ORDER — MIDAZOLAM HCL 5 MG/5ML IJ SOLN
INTRAMUSCULAR | Status: DC | PRN
  Administered 2015-03-26 (×2): 1 mg via INTRAVENOUS

## 2015-03-26 MED ORDER — DEXTROSE 5 % IV SOLN
500.0000 mg | Freq: Four times a day (QID) | INTRAVENOUS | Status: DC | PRN
  Filled 2015-03-26: qty 5

## 2015-03-26 MED ORDER — FENTANYL CITRATE (PF) 100 MCG/2ML IJ SOLN: 25.0000 ug | INTRAMUSCULAR | Status: DC | PRN

## 2015-03-26 MED ORDER — MENTHOL 3 MG MT LOZG: 1.0000 | LOZENGE | OROMUCOSAL | Status: DC | PRN

## 2015-03-26 MED ORDER — SENNA 8.6 MG PO TABS
1.0000 | ORAL_TABLET | Freq: Two times a day (BID) | ORAL | Status: DC
  Administered 2015-03-26 – 2015-03-29 (×6): 8.6 mg via ORAL
  Filled 2015-03-26 (×6): qty 1

## 2015-03-26 MED ORDER — ONDANSETRON HCL 4 MG/2ML IJ SOLN
4.0000 mg | Freq: Once | INTRAMUSCULAR | Status: DC | PRN
Start: 2015-03-26 — End: 2015-03-26

## 2015-03-26 MED ORDER — FENTANYL CITRATE (PF) 100 MCG/2ML IJ SOLN
INTRAMUSCULAR | Status: DC | PRN
  Administered 2015-03-26: 50 ug via INTRAVENOUS

## 2015-03-26 MED ORDER — PROPOFOL 500 MG/50ML IV EMUL
INTRAVENOUS | Status: DC | PRN
  Administered 2015-03-26: 50 ug/kg/min via INTRAVENOUS

## 2015-03-26 MED ORDER — SODIUM CHLORIDE 0.9 % IV BOLUS (SEPSIS)
1000.0000 mL | Freq: Once | INTRAVENOUS | Status: AC
  Administered 2015-03-26: 1000 mL via INTRAVENOUS

## 2015-03-26 MED ORDER — VITAMIN D 1000 UNITS PO TABS
1000.0000 [IU] | ORAL_TABLET | Freq: Every day | ORAL | Status: DC
  Administered 2015-03-27 – 2015-03-29 (×3): 1000 [IU] via ORAL
  Filled 2015-03-26 (×3): qty 1

## 2015-03-26 MED ORDER — ALUM & MAG HYDROXIDE-SIMETH 200-200-20 MG/5ML PO SUSP: 30.0000 mL | ORAL | Status: DC | PRN

## 2015-03-26 MED ORDER — METOCLOPRAMIDE HCL 5 MG/ML IJ SOLN: 5.0000 mg | Freq: Three times a day (TID) | INTRAMUSCULAR | Status: DC | PRN

## 2015-03-26 SURGICAL SUPPLY — 51 items
BAG COUNTER SPONGE EZ (MISCELLANEOUS) ×2 IMPLANT
BLADE DEBAKEY 8.0 (BLADE) ×2 IMPLANT
BLADE DEBAKEY 8.0MM (BLADE) ×1
BLADE SAGITTAL WIDE XTHICK NO (BLADE) ×3 IMPLANT
BLADE SURG SZ10 CARB STEEL (BLADE) ×3 IMPLANT
CANISTER SUCT 1200ML W/VALVE (MISCELLANEOUS) ×9 IMPLANT
CAPT HIP HEMI 2 ×3 IMPLANT
CHLORAPREP W/TINT 26ML (MISCELLANEOUS) ×6 IMPLANT
COUNTER SPONGE BAG EZ (MISCELLANEOUS) ×1
DRAPE INCISE IOBAN 66X60 STRL (DRAPES) ×6 IMPLANT
DRAPE TABLE BACK 80X90 (DRAPES) ×3 IMPLANT
DRSG AQUACEL AG ADV 3.5X10 (GAUZE/BANDAGES/DRESSINGS) ×3 IMPLANT
DRSG AQUACEL AG ADV 3.5X14 (GAUZE/BANDAGES/DRESSINGS) ×3 IMPLANT
ELECT BLADE 6.5 EXT (BLADE) ×3 IMPLANT
ELECT CAUTERY BLADE 6.4 (BLADE) ×3 IMPLANT
ELECT REM PT RETURN 9FT ADLT (ELECTROSURGICAL) ×3
ELECTRODE REM PT RTRN 9FT ADLT (ELECTROSURGICAL) ×1 IMPLANT
GAUZE PETRO XEROFOAM 1X8 (MISCELLANEOUS) ×6 IMPLANT
GAUZE SPONGE 4X4 12PLY STRL (GAUZE/BANDAGES/DRESSINGS) ×3 IMPLANT
GLOVE INDICATOR 8.0 STRL GRN (GLOVE) ×3 IMPLANT
GLOVE SURG ORTHO 8.5 STRL (GLOVE) ×3 IMPLANT
GOWN STRL REUS W/ TWL LRG LVL3 (GOWN DISPOSABLE) ×3 IMPLANT
GOWN STRL REUS W/TWL LRG LVL3 (GOWN DISPOSABLE) ×6
HANDPIECE SUCTION TUBG SURGILV (MISCELLANEOUS) ×3 IMPLANT
HEMOVAC 400CC 10FR (MISCELLANEOUS) ×3 IMPLANT
HIP CAPITATED HEMI 2 ×1 IMPLANT
IV NS 1000ML (IV SOLUTION)
IV NS 1000ML BAXH (IV SOLUTION) IMPLANT
KIT RM TURNOVER STRD PROC AR (KITS) ×3 IMPLANT
NEEDLE FILTER BLUNT 18X 1/2SAF (NEEDLE) ×2
NEEDLE FILTER BLUNT 18X1 1/2 (NEEDLE) ×1 IMPLANT
NEEDLE MAYO CATGUT SZ4 (NEEDLE) ×3 IMPLANT
NEEDLE SPNL 18GX3.5 QUINCKE PK (NEEDLE) ×6 IMPLANT
NS IRRIG 1000ML POUR BTL (IV SOLUTION) ×3 IMPLANT
PACK HIP PROSTHESIS (MISCELLANEOUS) ×3 IMPLANT
PAD ABD DERMACEA PRESS 5X9 (GAUZE/BANDAGES/DRESSINGS) ×6 IMPLANT
SOL PREP PVP 2OZ (MISCELLANEOUS) ×3
SOLUTION PREP PVP 2OZ (MISCELLANEOUS) ×1 IMPLANT
STAPLER SKIN PROX 35W (STAPLE) ×3 IMPLANT
SUT DVC 2 QUILL PDO  T11 36X36 (SUTURE) ×4
SUT DVC 2 QUILL PDO T11 36X36 (SUTURE) ×2 IMPLANT
SUT QUILL PDO 0 36 36 VIOLET (SUTURE) ×3 IMPLANT
SUT TICRON 2-0 30IN 311381 (SUTURE) ×15 IMPLANT
SUT VIC AB 2-0 CT1 (SUTURE) ×3 IMPLANT
SYR 30ML LL (SYRINGE) ×3 IMPLANT
SYR 50ML LL SCALE MARK (SYRINGE) ×3 IMPLANT
SYRINGE 10CC LL (SYRINGE) ×3 IMPLANT
TAPE MICROFOAM 4IN (TAPE) ×3 IMPLANT
TIP COAXIAL FEMORAL CANAL (MISCELLANEOUS) ×3 IMPLANT
TUBE SUCT KAM VAC (TUBING) ×3 IMPLANT
WATER STERILE IRR 1000ML POUR (IV SOLUTION) ×3 IMPLANT

## 2015-03-26 NOTE — H&P (Addendum)
Cassandra Joyce is an 70 y.o. female.   Chief Complaint: Fall HPI: The patient with past medical history of diabetes mellitus type 1 presents emergency department after suffering a fall down her stairs. She landed on her right side and immediately felt excruciating pain in her right leg. She had gotten up from bed because she felt as if her blood sugar were low. She does not remember the fall nor does she remember hitting her head. In the emergency department x-ray of her right hip showed a transverse fracture of the right femoral neck. Orthopedic surgery was contacted and the emergency department staff called the hospitalist service for medical management.  Past Medical History  Diagnosis Date  . Diabetes mellitus type 1 [facility])     Past Surgical History  Procedure Laterality Date  . Esophagogastroduodenoscopy N/A 07/11/2014    Procedure: ESOPHAGOGASTRODUODENOSCOPY (EGD);  Surgeon: Manya Silvas, MD;  Location: [facility] ENDOSCOPY;  Service: Endoscopy;  Laterality: N/A;    Family History  Problem Relation Age of Onset  . Diabetes Mellitus II Mother   . Diabetes Mellitus II Sister    Social History:  reports that she has never smoked. She does not have any smokeless tobacco history on file. Her alcohol and drug histories are not on file.  Allergies:  Allergies  Allergen Reactions  . Augmentin [Amoxicillin-Pot Clavulanate]   . Mercury Swelling  . Penicillin G Nausea Only  . Sulfa Antibiotics     Prior to Admission medications   Medication Sig Start Date End Date Taking? Authorizing Provider  aspirin 81 MG tablet Take 81 mg by mouth daily.   Yes Historical Provider, MD  cholecalciferol (VITAMIN D) 400 UNITS TABS tablet Take 1,000 Units by mouth daily.   Yes Historical Provider, MD  enalapril (VASOTEC) 2.5 MG tablet Take 2.5 mg by mouth.   Yes Historical Provider, MD  fluticasone (FLONASE) 50 MCG/ACT nasal spray Place 2 sprays into both nostrils daily.   Yes Historical Provider, MD   insulin lispro (HUMALOG) 100 UNIT/ML injection Inject 35 Units into the skin once. Pt uses insulin pump   Yes Historical Provider, MD  omeprazole (PRILOSEC) 40 MG capsule Take 40 mg by mouth daily.   Yes Historical Provider, MD  simvastatin (ZOCOR) 40 MG tablet Take 40 mg by mouth daily.   Yes Historical Provider, MD  ondansetron (ZOFRAN) 4 MG tablet Take 1 tablet (4 mg total) by mouth daily as needed for nausea or vomiting. Patient not taking: Reported on 03/26/2015 01/25/15   Harvest Dark, MD     Results for orders placed or performed during the hospital encounter of 03/26/15 (from the past 48 hour(s))  CBC with Differential     Status: Abnormal   Collection Time: 03/26/15  3:13 AM  Result Value Ref Range   WBC 12.1 (H) 3.6 - 11.0 K/uL   RBC 4.18 3.80 - 5.20 MIL/uL   Hemoglobin 12.1 12.0 - 16.0 g/dL   HCT 36.3 35.0 - 47.0 %   MCV 86.8 80.0 - 100.0 fL   MCH 28.9 26.0 - 34.0 pg   MCHC 33.3 32.0 - 36.0 g/dL   RDW 13.5 11.5 - 14.5 %   Platelets 147 (L) 150 - 440 K/uL   Neutrophils Relative % 85 %   Neutro Abs 10.3 (H) 1.4 - 6.5 K/uL   Lymphocytes Relative 10 %   Lymphs Abs 1.2 1.0 - 3.6 K/uL   Monocytes Relative 4 %   Monocytes Absolute 0.4 0.2 - 0.9 K/uL  Eosinophils Relative 1 %   Eosinophils Absolute 0.1 0 - 0.7 K/uL   Basophils Relative 0 %   Basophils Absolute 0.0 0 - 0.1 K/uL  Comprehensive metabolic panel     Status: Abnormal   Collection Time: 03/26/15  3:13 AM  Result Value Ref Range   Sodium 135 135 - 145 mmol/L   Potassium 3.8 3.5 - 5.1 mmol/L   Chloride 103 101 - 111 mmol/L   CO2 24 22 - 32 mmol/L   Glucose, Bld 217 (H) 65 - 99 mg/dL   BUN 23 (H) 6 - 20 mg/dL   Creatinine, Ser 0.81 0.44 - 1.00 mg/dL   Calcium 8.9 8.9 - 10.3 mg/dL   Total Protein 6.5 6.5 - 8.1 g/dL   Albumin 4.0 3.5 - 5.0 g/dL   AST 27 15 - 41 U/L   ALT 20 14 - 54 U/L   Alkaline Phosphatase 64 38 - 126 U/L   Total Bilirubin 0.5 0.3 - 1.2 mg/dL   GFR calc non Af Amer >60 >60 mL/min   GFR  calc Af Amer >60 >60 mL/min    Comment: (NOTE) The eGFR has been calculated using the CKD EPI equation. This calculation has not been validated in all clinical situations. eGFR's persistently <60 mL/min signify possible Chronic Kidney Disease.    Anion gap 8 5 - 15  Troponin I     Status: None   Collection Time: 03/26/15  3:13 AM  Result Value Ref Range   Troponin I <0.03 <0.031 ng/mL    Comment:        NO INDICATION OF MYOCARDIAL INJURY.   Glucose, capillary     Status: Abnormal   Collection Time: 03/26/15  3:22 AM  Result Value Ref Range   Glucose-Capillary 222 (H) 65 - 99 mg/dL  Glucose, capillary     Status: Abnormal   Collection Time: 03/26/15  3:33 AM  Result Value Ref Range   Glucose-Capillary 222 (H) 65 - 99 mg/dL  Urinalysis complete, with microscopic ([facility] only)     Status: Abnormal   Collection Time: 03/26/15  5:39 AM  Result Value Ref Range   Color, Urine YELLOW (A) YELLOW   APPearance CLEAR (A) CLEAR   Glucose, UA 50 (A) NEGATIVE mg/dL   Bilirubin Urine NEGATIVE NEGATIVE   Ketones, ur NEGATIVE NEGATIVE mg/dL   Specific Gravity, Urine 1.021 1.005 - 1.030   Hgb urine dipstick 2+ (A) NEGATIVE   pH 5.0 5.0 - 8.0   Protein, ur NEGATIVE NEGATIVE mg/dL   Nitrite NEGATIVE NEGATIVE   Leukocytes, UA NEGATIVE NEGATIVE   RBC / HPF 6-30 0 - 5 RBC/hpf   WBC, UA 0-5 0 - 5 WBC/hpf   Bacteria, UA NONE SEEN NONE SEEN   Squamous Epithelial / LPF 0-5 (A) NONE SEEN   Mucous PRESENT    Hyaline Casts, UA PRESENT   Protime-INR     Status: Abnormal   Collection Time: 03/26/15  5:47 AM  Result Value Ref Range   Prothrombin Time 15.5 (H) 11.4 - 15.0 seconds   INR 1.21   APTT     Status: None   Collection Time: 03/26/15  5:47 AM  Result Value Ref Range   aPTT 30 24 - 36 seconds  Calcium     Status: Abnormal   Collection Time: 03/26/15  5:47 AM  Result Value Ref Range   Calcium 8.6 (L) 8.9 - 10.3 mg/dL  Albumin     Status: None   Collection Time: 03/26/15  5:47 AM  Result  Value Ref Range   Albumin 3.8 3.5 - 5.0 g/dL  Type and screen     Status: None (Preliminary result)   Collection Time: 03/26/15  5:47 AM  Result Value Ref Range   ABO/RH(D) PENDING    Antibody Screen PENDING    Sample Expiration 03/29/2015   ABO/Rh     Status: None   Collection Time: 03/26/15  5:47 AM  Result Value Ref Range   ABO/RH(D) O POS   Glucose, capillary     Status: Abnormal   Collection Time: 03/26/15  5:59 AM  Result Value Ref Range   Glucose-Capillary 311 (H) 65 - 99 mg/dL   Dg Chest 1 View  03/26/2015  CLINICAL DATA:  [place] down stairs tonight. Possible syncope. Elevated blood sugar. EXAM: CHEST 1 VIEW COMPARISON:  01/25/2015 FINDINGS: The heart size and mediastinal contours are within normal limits. Both lungs are clear. The visualized skeletal structures are unremarkable. IMPRESSION: No active disease. Electronically Signed   By: Lucienne Capers M.D.   On: 03/26/2015 03:58   Dg Hip Unilat  With Pelvis 2-3 Views Right  03/26/2015  CLINICAL DATA:  70 year old female with fall and right hip pain. EXAM: DG HIP (WITH OR WITHOUT PELVIS) 2-3V RIGHT COMPARISON:  CT dated 05/18/2012 FINDINGS: There is a mildly displaced transverse fracture of the right femoral neck. The head of the right femur remains in anatomic alignment with the right acetabulum. There is mild proximal migration of the right femoral shaft in relation to the head of the femur. The bones are osteopenic. No other acute fracture identified. 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 Moderate stool noted throughout the colon. IMPRESSION: Mildly displaced transverse fracture of the right femoral neck. Electronically Signed   By: Anner Crete M.D.   On: 03/26/2015 04:59    Review of Systems  Constitutional: Negative for fever and chills.  HENT: Negative for sore throat and tinnitus.   Eyes: Negative for blurred vision and redness.  Respiratory: Negative for cough and  shortness of breath.   Cardiovascular: Negative for chest pain, palpitations, orthopnea and PND.  Gastrointestinal: Negative for nausea, vomiting, abdominal pain and diarrhea.  Genitourinary: Negative for dysuria, urgency and frequency.  Musculoskeletal: Positive for joint pain. Negative for myalgias.  Skin: Negative for rash.       No lesions  Neurological: Negative for speech change, focal weakness and weakness.  Endo/Heme/Allergies: Does not bruise/bleed easily.       No temperature intolerance  Psychiatric/Behavioral: Negative for depression and suicidal ideas.    Blood pressure 158/74, pulse 69, temperature 97.5 F (36.4 C), temperature source Oral, resp. rate 20, weight 58.968 kg (130 lb), SpO2 100 %. Physical Exam  Vitals reviewed. Constitutional: She is oriented to person, place, and time. She appears well-developed and well-nourished. No distress.  HENT:  Head: Normocephalic and atraumatic.  Mouth/Throat: Oropharynx is clear and moist.  Eyes: Conjunctivae and EOM are normal. Pupils are equal, round, and reactive to light. No scleral icterus.  Neck: Normal range of motion. Neck supple. No JVD present. No tracheal deviation present. No thyromegaly present.  Cardiovascular: Normal rate, regular rhythm and normal heart sounds.  Exam reveals no gallop and no friction rub.   No murmur heard. Respiratory: Effort normal and breath sounds normal.  GI: Soft. Bowel sounds are normal. She exhibits no distension. There is no tenderness.  Genitourinary:  Deferred  Musculoskeletal: She exhibits no edema.  Range of motion in  the right leg limited by pain  Lymphadenopathy:    She has no cervical adenopathy.  Neurological: She is alert and oriented to person, place, and time. No cranial nerve deficit. She exhibits normal muscle tone.  Skin: Skin is warm and dry. No rash noted. No erythema.  Psychiatric: She has a normal mood and affect. Her behavior is normal. Judgment and thought content  normal.     Assessment/Plan This is a 70 year old female admitted for right hip fracture. 1. Right hip fracture: Goal is to manage pain. Traction orders placed by surgery. She is a nonsmoker and has only hypertension. The patient takes a baby aspirin daily but has no history of coronary artery disease. She is moderate high risk due to her age. She may proceed with surgery. I have held her aspirin which will need to be resumed postoperatively. 2. Essential hypertension: Continue lisinopril 3. Diabetes mellitus type 1: I have discontinued the patient's insulin pump preoperatively. I place her on a sliding scale with every 6 hours blood sugar checks. She may resume her insulin pump postoperatively. 4. Hyperlipidemia: Continue simvastatin 5. DVT prophylaxis: Lovenox postoperatively 6. GI prophylaxis: None The patient is a full code. Time spent on admission orders and patient care approximately 45 minutes  Harrie Foreman, MD 03/26/2015, 6:29 AM

## 2015-03-26 NOTE — ED Provider Notes (Signed)
[facility] Emergency Department Provider Note  ____________________________________________  Time seen: Approximately 3:18 AM  I have reviewed the triage vital signs and the nursing notes.   HISTORY  Chief Complaint Fall    HPI Cassandra Joyce is a 70 y.o. female presents to the ED from home via EMS with a chief complaint of fall with right hip pain. Patient is an insulin-dependent diabetic who wears an insulin pump. States she made a recent day trip to [place], felt extremely tired after coming home tonight. Her sugars were consistently low in the 60s despite eating. Does not remember how she fell. Patient reports they found patient at the bottom of her stairs. Complains of right hip pain on movement. Denies recent fever, chills, headache, neck pain, chest pain, shortness breath, abdominal pain, nausea, vomiting, diarrhea.   Past Medical History  Diagnosis Date  . Diabetes mellitus without complication ([place])     There are no active problems to display for this patient.   Past Surgical History  Procedure Laterality Date  . Esophagogastroduodenoscopy N/A 07/11/2014    Procedure: ESOPHAGOGASTRODUODENOSCOPY (EGD);  Surgeon: Manya Silvas, MD;  Location: [facility] ENDOSCOPY;  Service: Endoscopy;  Laterality: N/A;    Current Outpatient Rx  Name  Route  Sig  Dispense  Refill  . aspirin 81 MG tablet   Oral   Take 81 mg by mouth daily.         . cholecalciferol (VITAMIN D) 400 UNITS TABS tablet   Oral   Take 1,000 Units by mouth daily.         . enalapril (VASOTEC) 2.5 MG tablet   Oral   Take 2.5 mg by mouth.         . fluticasone (FLONASE) 50 MCG/ACT nasal spray   Each Nare   Place 2 sprays into both nostrils daily.         . insulin lispro (HUMALOG) 100 UNIT/ML injection   Subcutaneous   Inject 35 Units into the skin once. Pt uses insulin pump         . omeprazole (PRILOSEC) 40 MG capsule   Oral   Take 40 mg by mouth daily.          . simvastatin (ZOCOR) 40 MG tablet   Oral   Take 40 mg by mouth daily.         . ondansetron (ZOFRAN) 4 MG tablet   Oral   Take 1 tablet (4 mg total) by mouth daily as needed for nausea or vomiting. Patient not taking: Reported on 03/26/2015   20 tablet   0     Allergies Augmentin; Mercury; Penicillin g; and Sulfa antibiotics  No family history on file.  Social History Social History  Substance Use Topics  . Smoking status: Never Smoker   . Smokeless tobacco: None  . Alcohol Use: None    Review of Systems  Constitutional: No fever/chills. Eyes: No visual changes. ENT: No sore throat. Cardiovascular: Denies chest pain. Respiratory: Denies shortness of breath. Gastrointestinal: No abdominal pain.  No nausea, no vomiting.  No diarrhea.  No constipation. Genitourinary: Negative for dysuria. Musculoskeletal: Positive for right hip pain. Negative for back pain. Skin: Negative for rash. Neurological: Negative for headaches, focal weakness or numbness.  10-point ROS otherwise negative.  ____________________________________________   PHYSICAL EXAM:  VITAL SIGNS: ED Triage Vitals  Enc Vitals Group     BP 03/26/15 0310 184/87 mmHg     Pulse Rate 03/26/15 0310 76  Resp 03/26/15 0310 28     Temp 03/26/15 0310 97.5 F (36.4 C)     Temp Source 03/26/15 0310 Oral     SpO2 03/26/15 0310 100 %     Weight 03/26/15 0310 130 lb (58.968 kg)     Height --      Head Cir --      Peak Flow --      Pain Score 03/26/15 0311 10     Pain Loc --      Pain Edu? --      Excl. in [place]? --     Constitutional: Alert and oriented. Well appearing and in moderate acute distress. Tearful. Eyes: Conjunctivae are normal. PERRL. EOMI. Head: Atraumatic. Nose: No congestion/rhinnorhea. Mouth/Throat: Mucous membranes are moist.  Oropharynx non-erythematous. Neck: No stridor.  No cervical spine tenderness to palpation. Cardiovascular: Normal rate, regular rhythm. Grossly normal heart  sounds.  Good peripheral circulation. Respiratory: Normal respiratory effort.  No retractions. Lungs CTAB. Gastrointestinal: Soft and nontender. No distention. No abdominal bruits. No CVA tenderness. Musculoskeletal: No shortening or rotation of right lower extremity. Exquisite tenderness on palpation. Limited range of motion secondary to pain. 2+ peripheral pulses. Calf is supple without evidence for compartment syndrome. Limb is symmetrically warm without evidence for ischemia. Neurologic:  Normal speech and language. No gross focal neurologic deficits are appreciated.  Skin:  Skin is warm, dry and intact. No rash noted. Psychiatric: Mood and affect are normal. Speech and behavior are normal.  ____________________________________________   LABS (all labs ordered are listed, but only abnormal results are displayed)  Labs Reviewed  CBC WITH DIFFERENTIAL/PLATELET - Abnormal; Notable for the following:    WBC 12.1 (*)    Platelets 147 (*)    Neutro Abs 10.3 (*)    All other components within normal limits  COMPREHENSIVE METABOLIC PANEL - Abnormal; Notable for the following:    Glucose, Bld 217 (*)    BUN 23 (*)    All other components within normal limits  GLUCOSE, CAPILLARY - Abnormal; Notable for the following:    Glucose-Capillary 222 (*)    All other components within normal limits  GLUCOSE, CAPILLARY - Abnormal; Notable for the following:    Glucose-Capillary 222 (*)    All other components within normal limits  TROPONIN I  URINALYSIS COMPLETEWITH MICROSCOPIC ([facility] ONLY)   ____________________________________________  EKG  ED ECG REPORT I, Lasonya Hubner J, the attending physician, personally viewed and interpreted this ECG.   Date: 03/26/2015  EKG Time: 0325  Rate: 70  Rhythm: normal EKG, normal sinus rhythm  Axis: Normal  Intervals:none  ST&T Change: Nonspecific  ____________________________________________  RADIOLOGY  Chest x-ray (viewed by me, interpreted per Dr.  Gerilyn Nestle): No active disease.  Right hip xrays w/ pelvis (viewed by me, interpreted per Dr. Gerilyn Nestle): Mildly displaced transverse fracture of the right femoral neck. ____________________________________________   PROCEDURES  Procedure(s) performed: None  Critical Care performed: No  ____________________________________________   INITIAL IMPRESSION / ASSESSMENT AND PLAN / ED COURSE  Pertinent labs & imaging results that were available during my care of the patient were reviewed by me and considered in my medical decision making (see chart for details).  70 year old female who presents after a fall with right hip pain. Insulin pump is turned off in the emergency department so we can closely monitor blood sugars. Initial Accu-Check is 222. Will administer IV analgesia, obtain screening lab work and imaging studies of the right hip.  ----------------------------------------- 5:07 AM on 03/26/2015 -----------------------------------------  Updated patient and spouse of x-ray imaging results. Discussed with orthopedist (Dr. Sabra Heck) as well as hospitalist to evaluate patient in the emergency department for admission. ____________________________________________   FINAL CLINICAL IMPRESSION(S) / ED DIAGNOSES  Final diagnoses:  Hip fracture, right, closed, initial encounter [facility])  Fall, initial encounter  Type 1 diabetes mellitus without complication (HCC)      Paulette Blanch, MD 03/26/15 [id number]

## 2015-03-26 NOTE — OR Nursing (Signed)
Spoke with Almyra Free (diabetes coordinator) who states per unit given to patient this will lower her glucose 85 points; dr Andree Elk made aware of this information.  No further instruction at this time;

## 2015-03-26 NOTE — Anesthesia Preprocedure Evaluation (Addendum)
Anesthesia Evaluation  Patient identified by MRN, date of birth, ID band Patient awake    Reviewed: Allergy & Precautions, H&P , NPO status , Patient's Chart, lab work & pertinent test results, reviewed documented beta blocker date and time   Airway Mallampati: III  TM Distance: >3 FB Neck ROM: full    Dental no notable dental hx. (+) Teeth Intact   Pulmonary neg pulmonary ROS,    Pulmonary exam normal breath sounds clear to auscultation       Cardiovascular Exercise Tolerance: Good negative cardio ROS Normal cardiovascular exam Rhythm:regular Rate:Normal     Neuro/Psych negative neurological ROS  negative psych ROS   GI/Hepatic negative GI ROS, Neg liver ROS,   Endo/Other  negative endocrine ROSdiabetes, Poorly Controlled, Type 1, Insulin Dependent  Renal/GU negative Renal ROS  negative genitourinary   Musculoskeletal   Abdominal   Peds  Hematology negative hematology ROS (+)   Anesthesia Other Findings   Reproductive/Obstetrics negative OB ROS                            Anesthesia Physical Anesthesia Plan  ASA: III  Anesthesia Plan: Regional and Spinal   Post-op Pain Management:    Induction:   Airway Management Planned:   Additional Equipment:   Intra-op Plan:   Post-operative Plan:   Informed Consent: I have reviewed the patients History and Physical, chart, labs and discussed the procedure including the risks, benefits and alternatives for the proposed anesthesia with the patient or authorized representative who has indicated his/her understanding and acceptance.     Plan Discussed with: CRNA  Anesthesia Plan Comments:         Anesthesia Quick Evaluation

## 2015-03-26 NOTE — ED Notes (Addendum)
Pt bib EMS w/ c/o fall and R hip pain.  Per EMS, pt found at bottom of stairs, unknown how many stairs she fell down.  Pt sts she does not known if fall mechanical in nature.  Pt denies n/v/d, SOB, CP, and fever.  Pt tearful and c/o R hip pain.  Per EMS, no shortening or rotation noted on inspection.  CBG 87

## 2015-03-26 NOTE — Anesthesia Postprocedure Evaluation (Signed)
Anesthesia Post Note  Patient: Cassandra Joyce  Procedure(s) Performed: Procedure(s) (LRB): ARTHROPLASTY BIPOLAR HIP (HEMIARTHROPLASTY) (Right)  Patient location during evaluation: PACU Anesthesia Type: General Level of consciousness: awake and alert Pain management: pain level controlled Vital Signs Assessment: post-procedure vital signs reviewed and stable Respiratory status: spontaneous breathing, nonlabored ventilation, respiratory function stable and patient connected to nasal cannula oxygen Cardiovascular status: blood pressure returned to baseline and stable Postop Assessment: no signs of nausea or vomiting Anesthetic complications: no    Last Vitals:  Filed Vitals:   03/26/15 1228 03/26/15 1243  BP: 97/45 105/45  Pulse: 72 72  Temp:    Resp: 14 12    Last Pain:  Filed Vitals:   03/26/15 1247  PainSc: [place]

## 2015-03-26 NOTE — NC FL2 (Signed)
[place] LEVEL OF CARE SCREENING TOOL     IDENTIFICATION  Patient Name: Cassandra Joyce Birthdate: 1945-06-26 Sex: female Admission Date (Current Location): 03/26/2015  [place] and [place] Number:  Engineering geologist and Address:  [facility], [address], [place], [place] [address]      Provider Number: [id number]  Attending Physician Name and Address:  Fritzi Mandes, MD  Relative Name and Phone Number:       Current Level of Care: Hospital Recommended Level of Care: [place] Prior Approval Number:    Date Approved/Denied:   PASRR Number:  ([id number])  Discharge Plan: SNF    Current Diagnoses: Patient Active Problem List   Diagnosis Date Noted  . Hip fracture ([place]) 03/26/2015   Surgical History Surgery Date Laterality Comments  TONSILLECTOMY        HERNIA REPAIR        trigger finger release   Left    COLONOSCOPY 06/22/2012, 03/21/2007   Int Hemorrhoids: CBF 06/2022  EGD 06/22/2012   Barrett's Esophagus: CBF 06/2014; Recall Ltr mailed 04/09/2014 (dw)  EGD 07/11/2014   Barrett's Esophagus: CBF 07/2017  Medical History Medical History Date Comments  Diabetes mellitus type 2, uncomplicated (CMS-HCC)      Hypertension      Sinusitis      Menopause      Hematuria      Dyspareunia      Microscopic colitis      Barrett's esophagus      Nasal polyps        Orientation RESPIRATION BLADDER Height & Weight     Self, Time, Situation, Place  O2 (Nasal Cannula 2 (L/min) ) Continent Weight: 130 lb (58.968 kg) Height:     BEHAVIORAL SYMPTOMS/MOOD NEUROLOGICAL BOWEL NUTRITION STATUS   (None)  (None) Continent Diet (Regular)  AMBULATORY STATUS COMMUNICATION OF NEEDS Skin   Extensive Assist Verbally Other (Comment) (Closed Incision Right Hip)                       Personal Care Assistance Level of Assistance  Bathing, Feeding, Dressing Bathing Assistance: Limited assistance Feeding assistance:  Independent Dressing Assistance: Limited assistance     Functional Limitations Info  Sight, Hearing, Speech Sight Info: Adequate Hearing Info: Adequate Speech Info: Adequate    SPECIAL CARE FACTORS FREQUENCY  PT (By licensed PT)     PT Frequency:  (5)              Contractures      Additional Factors Info  Insulin Sliding Scale, Allergies, Code Status Code Status Info:  (Full Code) Allergies Info:  (Augmentin, Mercury, Penicillin G, Sulfa Antibiotics)   Insulin Sliding Scale Info:  (insulin aspart (novoLOG) 100 UNIT/ML injection )       Current Medications (03/26/2015):  This is the current hospital active medication list Current Facility-Administered Medications  Medication Dose Route Frequency Provider Last Rate Last Dose  . 0.45 % sodium chloride infusion   Intravenous Continuous Earnestine Leys, MD 75 mL/hr at 03/26/15 [phone]    . 0.9 %  sodium chloride infusion   Intravenous Continuous Harrie Foreman, MD      . acetaminophen (TYLENOL) tablet 650 mg  650 mg Oral Q6H PRN Harrie Foreman, MD       Or  . acetaminophen (TYLENOL) suppository 650 mg  650 mg Rectal Q6H PRN Harrie Foreman, MD      . alum & Iris Pert  hydroxide-simeth (MAALOX/MYLANTA) 200-200-20 MG/5ML suspension 30 mL  30 mL Oral Q4H PRN Earnestine Leys, MD      . bisacodyl (DULCOLAX) suppository 10 mg  10 mg Rectal Daily PRN Earnestine Leys, MD      . ceFAZolin (ANCEF) IVPB 2g/100 mL premix  2 g Intravenous Q6H Earnestine Leys, MD      . cholecalciferol (VITAMIN D) tablet 1,000 Units  1,000 Units Oral Daily Harrie Foreman, MD      . clindamycin (CLEOCIN) IVPB 600 mg  600 mg Intravenous 3 times per day Earnestine Leys, MD      . docusate sodium (COLACE) capsule 100 mg  100 mg Oral BID Harrie Foreman, MD      . enalapril (VASOTEC) tablet 2.5 mg  2.5 mg Oral Daily Harrie Foreman, MD      . Derrill Memo ON 03/27/2015] enoxaparin (LOVENOX) injection 30 mg  30 mg Subcutaneous Q24H Earnestine Leys, MD      . Derrill Memo ON  03/27/2015] ferrous sulfate tablet 325 mg  325 mg Oral Q breakfast Earnestine Leys, MD      . fluticasone [facility]) 50 MCG/ACT nasal spray 2 spray  2 spray Each Nare Daily Harrie Foreman, MD      . HYDROcodone-acetaminophen (NORCO/VICODIN) 5-325 MG per tablet 1-2 tablet  1-2 tablet Oral Q6H PRN Earnestine Leys, MD      . insulin aspart (novoLOG) 100 UNIT/ML injection           . insulin pump   Subcutaneous 6 times per day Fritzi Mandes, MD      . menthol-cetylpyridinium (CEPACOL) lozenge 3 mg  1 lozenge Oral PRN Earnestine Leys, MD       Or  . phenol (CHLORASEPTIC) mouth spray 1 spray  1 spray Mouth/Throat PRN Earnestine Leys, MD      . methocarbamol (ROBAXIN) tablet 500 mg  500 mg Oral Q6H PRN Earnestine Leys, MD       Or  . methocarbamol (ROBAXIN) 500 mg in dextrose 5 % 50 mL IVPB  500 mg Intravenous Q6H PRN Earnestine Leys, MD      . methocarbamol (ROBAXIN) tablet 500 mg  500 mg Oral Q6H PRN Earnestine Leys, MD       Or  . methocarbamol (ROBAXIN) 500 mg in dextrose 5 % 50 mL IVPB  500 mg Intravenous Q6H PRN Earnestine Leys, MD      . metoCLOPramide (REGLAN) tablet 5-10 mg  5-10 mg Oral Q8H PRN Earnestine Leys, MD       Or  . metoCLOPramide (REGLAN) injection 5-10 mg  5-10 mg Intravenous Q8H PRN Earnestine Leys, MD      . morphine 2 MG/ML injection 2 mg  2 mg Intravenous Q2H PRN Earnestine Leys, MD      . ondansetron [facility]) tablet 4 mg  4 mg Oral Q6H PRN Earnestine Leys, MD       Or  . ondansetron [facility]) injection 4 mg  4 mg Intravenous Q6H PRN Earnestine Leys, MD      . pantoprazole (PROTONIX) EC tablet 40 mg  40 mg Oral Daily Harrie Foreman, MD      . [facility]) tablet 8.6 mg  1 tablet Oral BID Earnestine Leys, MD      . simvastatin (ZOCOR) tablet 40 mg  40 mg Oral Daily Harrie Foreman, MD      . sodium chloride flush (NS) 0.9 % injection 3 mL  3 mL Intravenous Q12H Harrie Foreman, MD      .  sodium phosphate (FLEET) 7-19 GM/118ML enema 1 enema  1 enema Rectal Once PRN Earnestine Leys, MD      .  tranexamic acid (CYKLOKAPRON) 1,000 mg in sodium chloride 0.9 % 100 mL IVPB  1,000 mg Intravenous To OR Earnestine Leys, MD      . zolpidem Lorrin Mais) tablet 5 mg  5 mg Oral QHS PRN Earnestine Leys, MD         Discharge Medications: Please see discharge summary for a list of discharge medications.  Relevant Imaging Results:  Relevant Lab Results:   Additional Information  (SSN [id number])  Lorenso Quarry [place]ins, LCSW

## 2015-03-26 NOTE — ED Notes (Signed)
Patient transported to X-ray 

## 2015-03-26 NOTE — Anesthesia Procedure Notes (Signed)
Spinal Patient location during procedure: OR Start time: 03/26/2015 9:50 AM Reason for block: at surgeon's request Preanesthetic Checklist Completed: patient identified, site marked, surgical consent, pre-op evaluation, timeout performed, IV checked, risks and benefits discussed, monitors and equipment checked and at surgeon's request Spinal Block Patient position: left lateral decubitus Prep: Betadine Patient monitoring: heart rate, cardiac monitor, continuous pulse ox and blood pressure Approach: midline Location: L4-5 Injection technique: single-shot Needle Needle type: Quincke  Needle gauge: 22 G Needle length: 9 cm Assessment Sensory level: T6 Additional Notes tolerated well with vsst.  No paresthesia . JA

## 2015-03-26 NOTE — Op Note (Signed)
03/26/2015  11:51 AM  PATIENT:  Cassandra Joyce   MRN: [id number]  PRE-OPERATIVE DIAGNOSIS:  fractured right hip femoral neck  POST-OPERATIVE DIAGNOSIS:  fractured right hip femoral neck  PROCEDURE:  Procedure(s): ARTHROPLASTY UNIPOLAR HIP (HEMIARTHROPLASTY)  PREOPERATIVE INDICATIONS:  Cassandra Joyce is an 70 y.o. female who was admitted 03/26/2015 with a diagnosis of <principal problem not specified> and elected for surgical management.  The risks benefits and alternatives were discussed with the patient including but not limited to the risks of nonoperative treatment, versus surgical intervention including infection, bleeding, nerve injury, periprosthetic fracture, the need for revision surgery, dislocation, leg length discrepancy, blood clots, cardiopulmonary complications, morbidity, mortality, among others, and they were willing to proceed.  Predicted outcome is good, although there will be at least a six to nine month expected recovery.     SURGEON:  Earnestine Leys, MD    ANESTHESIA: Spinal    COMPLICATIONS:  None.      COMPONENTS:  [employer]ker Accolade Femoral Fracture stem size   #2  ,   and a size   54m   fracture head unipolar hip ball with    +446m  neck length.    PROCEDURE IN DETAIL: The patient was met in the holding area and identified.  The appropriate hip  was marked at the operative site. The patient was then transported to the OR and  placed under general anesthesia.  At that point, the patient was  placed in the lateral decubitus position with the operative side up and  secured to the operating room table and all bony prominences padded.     The operative lower extremity was prepped from the iliac crest to the toes.  Sterile draping was performed.  Time out was performed prior to incision.      A routine posterolateral approach was utilized via sharp dissection  carried down to the subcutaneous tissue.  Gross bleeders were Bovie  coagulated.  The iliotibial band was  identified and incised  along the length of the skin incision.  Self-retaining retractors were  inserted.  With the hip internally rotated, the short external rotators  were identified. The piriformis was tagged and the hip capsule released in a T-type fashion.  The femoral neck was exposed, and I resected the femoral neck using the appropriate jig. This was performed at approximately a thumb's breadth above the lesser trochanter.    I then exposed the deep acetabulum, cleared out any tissue including the ligamentum teres.    I then prepared the proximal femur using the cookie-cutter, the lateralizing reamer, and then sequentially broached.  A trial stem size #2   was  utilized along with a   4641m unipolar head with   +4mm33mneck and I reduced the hip and it was found to have excellent stability with functional range of motion. Leg lengths were equal.  The trial components were then removed.   The same size Accolade femoral stem was then inserted and was very stable.  The Unitrax head and neck as trialed were inserted as well.     The hip was then reduced and taken through functional range of motion and found to have excellent stability. Leg lengths were restored.    I closed the T in the capsule with #2 Ticron as well as the short external rotators.     I then irrigated the hip copiously again with pulse lavage, and repaired the fascia with #2 Quill and the  subcutaneous layer with #0 Quill. Sponge and needle counts were correct.   Dry sterile Aquacell was applied.   The patient was then awakened and returned to PACU in stable and satisfactory condition. There were no complications.  Cassandra Breed, MD Orthopedic Surgeon [phone]   03/26/2015 11:51 AM

## 2015-03-26 NOTE — Progress Notes (Signed)
Inpatient Diabetes Program Recommendations  AACE/ADA: New Consensus Statement on Inpatient Glycemic Control (2015)  Target Ranges:  Prepandial:   less than 140 mg/dL      Peak postprandial:   less than 180 mg/dL (1-2 hours)      Critically ill patients:  140 - 180 mg/dL   Review of Glycemic Control  Results for Cassandra Joyce, Cassandra Joyce (MRN [id number]) as of 03/26/2015 15:53  Ref. Range 03/26/2015 08:00 03/26/2015 12:01 03/26/2015 12:51 03/26/2015 13:39 03/26/2015 15:18  Glucose-Capillary Latest Ref Range: 65-99 mg/dL 275 (H) 349 (H) 387 (H) 365 (H) 230 (H)    Inpatient Diabetes Program Recommendations:  Patient received 2 units of R insulin at 1209 after surgery and then 10 units of Novolog at 1307.  Met with patient on 2A nursing unit post surgery- confirmed patient is now wearing her insulin pump.  She verbalizes that she is hungry.    Met with Dr. Posey Pronto at the patient bedside who confirmed that she wants the insulin pump to continue. I have d/c'd the insulin orders currently written ( Novolog 0-9 units q6h and Novolog 0-5 units qhs) per verbal order from Dr. Posey Pronto and placed the insulin pump orders per her verbal order.    Gentry Fitz, RN, BA, MHA, CDE Diabetes Coordinator Inpatient Diabetes Program  [phone] (Team Pager) [phone] ([place]) 03/26/2015 3:58 PM

## 2015-03-26 NOTE — OR Nursing (Signed)
Patient unable to tolerate any movement of body/hip, etc.  Only able to sage wipe her upper body.  Ted stocking applied to left leg only. Scd machine and stockings sent to OR with patient.

## 2015-03-26 NOTE — Consult Note (Signed)
ORTHOPAEDIC CONSULTATION  REQUESTING PHYSICIAN: Fritzi Mandes, MD  Chief Complaint: Right hip pain  HPI: Cassandra Joyce is a 70 y.o. female who complains of  Right hip pain following a fall at home early this AM.  Thought her blood sugar was low and fell going to get food.  Exama ns x-rays in the ER reveal a displaced right femoral neck fracture. Have discussed Rx with the patient and her husband who agree with hemiarthroplasty for this problem.  Risks and benefits and post op protocol discussed as well.  Will proceed this AM. She has been cleared for surgery.  She is diabetic and has an insulin pump.    Past Medical History  Diagnosis Date  . Diabetes mellitus type 1 [facility])    Past Surgical History  Procedure Laterality Date  . Esophagogastroduodenoscopy N/A 07/11/2014    Procedure: ESOPHAGOGASTRODUODENOSCOPY (EGD);  Surgeon: Manya Silvas, MD;  Location: [facility] ENDOSCOPY;  Service: Endoscopy;  Laterality: N/A;   Social History   Social History  . Marital Status: Married    Spouse Name: N/A  . Number of Children: N/A  . Years of Education: N/A   Social History Main Topics  . Smoking status: Never Smoker   . Smokeless tobacco: None  . Alcohol Use: None  . Drug Use: None  . Sexual Activity: Not Asked   Other Topics Concern  . None   Social History Narrative   Family History  Problem Relation Age of Onset  . Diabetes Mellitus II Mother   . Diabetes Mellitus II Sister    Allergies  Allergen Reactions  . Augmentin [Amoxicillin-Pot Clavulanate]   . Mercury Swelling  . Penicillin G Nausea Only  . Sulfa Antibiotics    Prior to Admission medications   Medication Sig Start Date End Date Taking? Authorizing Provider  aspirin 81 MG tablet Take 81 mg by mouth daily.   Yes Historical Provider, MD  cholecalciferol (VITAMIN D) 400 UNITS TABS tablet Take 1,000 Units by mouth daily.   Yes Historical Provider, MD  enalapril (VASOTEC) 2.5 MG tablet Take 2.5 mg by mouth.   Yes  Historical Provider, MD  fluticasone (FLONASE) 50 MCG/ACT nasal spray Place 2 sprays into both nostrils daily.   Yes Historical Provider, MD  insulin lispro (HUMALOG) 100 UNIT/ML injection Inject 35 Units into the skin once. Pt uses insulin pump   Yes Historical Provider, MD  omeprazole (PRILOSEC) 40 MG capsule Take 40 mg by mouth daily.   Yes Historical Provider, MD  simvastatin (ZOCOR) 40 MG tablet Take 40 mg by mouth daily.   Yes Historical Provider, MD  ondansetron (ZOFRAN) 4 MG tablet Take 1 tablet (4 mg total) by mouth daily as needed for nausea or vomiting. Patient not taking: Reported on 03/26/2015 01/25/15   Harvest Dark, MD   Dg Chest 1 View  03/26/2015  CLINICAL DATA:  [place] down stairs tonight. Possible syncope. Elevated blood sugar. EXAM: CHEST 1 VIEW COMPARISON:  01/25/2015 FINDINGS: The heart size and mediastinal contours are within normal limits. Both lungs are clear. The visualized skeletal structures are unremarkable. IMPRESSION: No active disease. Electronically Signed   By: Lucienne Capers M.D.   On: 03/26/2015 03:58   Dg Hip Unilat  With Pelvis 2-3 Views Right  03/26/2015  CLINICAL DATA:  70 year old female with fall and right hip pain. EXAM: DG HIP (WITH OR WITHOUT PELVIS) 2-3V RIGHT COMPARISON:  CT dated 05/18/2012 FINDINGS: There is a mildly displaced transverse fracture of the right femoral neck. The  head of the right femur remains in anatomic alignment with the right acetabulum. There is mild proximal migration of the right femoral shaft in relation to the head of the femur. The bones are osteopenic. No other acute fracture identified. 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 Moderate stool noted throughout the colon. IMPRESSION: Mildly displaced transverse fracture of the right femoral neck. Electronically Signed   By: Anner Crete M.D.   On: 03/26/2015 04:59    Positive ROS: All other systems have been reviewed and were  otherwise negative with the exception of those mentioned in the HPI and as above.  Physical Exam: General: Alert, no acute distress Cardiovascular: No pedal edema Respiratory: No cyanosis, no use of accessory musculature GI: No organomegaly, abdomen is soft and non-tender Skin: No lesions in the area of chief complaint Neurologic: Sensation intact distally Psychiatric: Patient is competent for consent with normal mood and affect Lymphatic: No axillary or cervical lymphadenopathy  MUSCULOSKELETAL: Alert female in significantn pain.  csm good right leg.  Short and rotated and held in flexion.  Skin intact.  Severe pain with movement.    Assessment: Displaced right femoral neck fx DM  Plan: Right hip hemiarthoplasty.    Park Breed, MD [phone]   03/26/2015 9:10 AM

## 2015-03-26 NOTE — OR Nursing (Signed)
Patient crying in pain, given demerol iv per anesthesia orders.

## 2015-03-26 NOTE — Progress Notes (Addendum)
Patient ID: Cassandra Joyce, female   DOB: 24-Aug-1945, 70 y.o.   MRN: [id number] [place] [facility] [place] NAME: Cassandra Joyce    MR#:  [id number]  DATE OF BIRTH:  08-06-1945  SUBJECTIVE:  Came in after a fall at home. Found to have hip fracture. Seen post-op in the room  REVIEW OF SYSTEMS:   Review of Systems  Constitutional: Negative for fever, chills and weight loss.  HENT: Negative for ear discharge, ear pain and nosebleeds.   Eyes: Negative for blurred vision, pain and discharge.  Respiratory: Negative for sputum production, shortness of breath, wheezing and stridor.   Cardiovascular: Negative for chest pain, palpitations, orthopnea and PND.  Gastrointestinal: Negative for nausea, vomiting, abdominal pain and diarrhea.  Genitourinary: Negative for urgency and frequency.  Musculoskeletal: Positive for joint pain and falls. Negative for back pain.  Neurological: Positive for weakness. Negative for sensory change, speech change and focal weakness.  Psychiatric/Behavioral: Negative for depression and hallucinations. The patient is not nervous/anxious.   All other systems reviewed and are negative.  Tolerating Diet:yes Tolerating PT: pending  DRUG ALLERGIES:   Allergies  Allergen Reactions  . Augmentin [Amoxicillin-Pot Clavulanate]   . Mercury Swelling  . Penicillin G Nausea Only  . Sulfa Antibiotics     VITALS:  Blood pressure 97/43, pulse 59, temperature 98 F (36.7 C), temperature source Oral, resp. rate 19, weight 58.968 kg (130 lb), SpO2 100 %.  PHYSICAL EXAMINATION:   Physical Exam  GENERAL:  70 y.o.-year-old patient lying in the bed with no acute distress.  EYES: Pupils equal, round, reactive to light and accommodation. No scleral icterus. Extraocular muscles intact.  HEENT: Head atraumatic, normocephalic. Oropharynx and nasopharynx clear.  NECK:  Supple, no jugular venous distention. No thyroid enlargement, no  tenderness.  LUNGS: Normal breath sounds bilaterally, no wheezing, rales, rhonchi. No use of accessory muscles of respiration.  CARDIOVASCULAR: S1, S2 normal. No murmurs, rubs, or gallops.  ABDOMEN: Soft, nontender, nondistended. Bowel sounds present. No organomegaly or mass.  EXTREMITIES: No cyanosis, clubbing or edema b/l.    NEUROLOGIC: Cranial nerves II through XII are intact. No focal Motor or sensory deficits b/l.   PSYCHIATRIC:  patient is alert and oriented x 3.  SKIN: No obvious rash, lesion, or ulcer.   LABORATORY PANEL:  CBC  Recent Labs Lab 03/26/15 0313  WBC 12.1*  HGB 12.1  HCT 36.3  PLT 147*    Chemistries   Recent Labs Lab 03/26/15 0313 03/26/15 0547  NA 135  --   K 3.8  --   CL 103  --   CO2 24  --   GLUCOSE 217*  --   BUN 23*  --   CREATININE 0.81  --   CALCIUM 8.9 8.6*  AST 27  --   ALT 20  --   ALKPHOS 64  --   BILITOT 0.5  --    Cardiac Enzymes  Recent Labs Lab 03/26/15 0313  TROPONINI <0.03   RADIOLOGY:  Dg Chest 1 View  03/26/2015  CLINICAL DATA:  [place] [address] down stairs tonight. Possible syncope. Elevated blood sugar. EXAM: CHEST 1 VIEW COMPARISON:  01/25/2015 FINDINGS: The heart size and mediastinal contours are within normal limits. Both lungs are clear. The visualized skeletal structures are unremarkable. IMPRESSION: No active disease. Electronically Signed   By: Lucienne Capers M.D.   On: 03/26/2015 03:58   Dg Hip Unilat With Pelvis 2-3 Views Right  03/26/2015  CLINICAL  DATA:  Hip fracture.  Hemiarthroplasty. EXAM: DG HIP (WITH OR WITHOUT PELVIS) 2-3V RIGHT COMPARISON:  03/26/2015 FINDINGS: Right hip hemiarthroplasty in satisfactory position and alignment. No acute fracture or hardware complication IMPRESSION: Satisfactory right hip hemiarthroplasty for fracture fixation. Electronically Signed   By: Franchot Gallo M.D.   On: 03/26/2015 14:07   Dg Hip Unilat  With Pelvis 2-3 Views Right  03/26/2015  CLINICAL DATA:  70 year old female with  fall and right hip pain. EXAM: DG HIP (WITH OR WITHOUT PELVIS) 2-3V RIGHT COMPARISON:  CT dated 05/18/2012 FINDINGS: There is a mildly displaced transverse fracture of the right femoral neck. The head of the right femur remains in anatomic alignment with the right acetabulum. There is mild proximal migration of the right femoral shaft in relation to the head of the femur. The bones are osteopenic. No other acute fracture identified. 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 Moderate stool noted throughout the colon. IMPRESSION: Mildly displaced transverse fracture of the right femoral neck. Electronically Signed   By: Anner Crete M.D.   On: 03/26/2015 04:59   ASSESSMENT AND PLAN:  70 year old female past medical history of diabetes mellitus type 1 presents emergency department after suffering a fall down her stairs. She landed on her right side and immediately felt excruciating pain in her right leg. She had gotten up from bed because she felt as if her blood sugar were low. She does not remember the fall nor does she remember hitting her head  She is being admitted for right hip fracture.  1. Right hip fracture: s/p surgery POD # 0 .-no history of coronary artery disease. She is moderate high risk due to her age.  -resume asa -incentive spirometer, PT to be initiated  2. Essential hypertension: Continue lisinopril  3. Diabetes mellitus type 1 -resumed patient's insulin pump preoperatively.  4. Hyperlipidemia: Continue simvastatin  5. DVT prophylaxis: Lovenox postoperatively  6. GI prophylaxis: None  Case discussed with Care Management/Social Worker. Management plans discussed with the patient, family and they are in agreement.  CODE STATUS: full  CSW for d/ planning  TOTAL TIME TAKING CARE OF THIS PATIENT30 minutes.  >50% time spent on counselling and coordination of care  POSSIBLE D/C IN 2-3 DAYS, DEPENDING ON CLINICAL  CONDITION.  Note: This dictation was prepared with Dragon dictation along with smaller phrase technology. Any transcriptional errors that result from this process are unintentional.  Joselito Fieldhouse M.D on 03/26/2015 at 8:05 PM  Between 7am to 6pm - Pager - [phone]  After 6pm go to [email or web address] - password [email or web address] [facility]  [facility]  Office  [phone]  CC: Primary care physician; Adrian Prows, MD

## 2015-03-26 NOTE — Transfer of Care (Signed)
Immediate Anesthesia Transfer of Care Note  Patient: Cassandra Joyce  Procedure(s) Performed: Procedure(s): ARTHROPLASTY BIPOLAR HIP (HEMIARTHROPLASTY) (Right)  Patient Location: PACU  Anesthesia Type:Spinal  Level of Consciousness: sedated  Airway & Oxygen Therapy: Patient Spontanous Breathing and Patient connected to face mask oxygen  Post-op Assessment: Report given to RN and Post -op Vital signs reviewed and stable  Post vital signs: Reviewed and stable  Last Vitals:  Filed Vitals:   03/26/15 0800 03/26/15 1158  BP: 140/63 116/50  Pulse: 71 77  Temp:    Resp: 24 14    Complications: No apparent anesthesia complications

## 2015-03-26 NOTE — H&P (Signed)
THE PATIENT WAS SEEN PRIOR TO SURGERY TODAY.  HISTORY, ALLERGIES, HOME MEDICATIONS AND OPERATIVE PROCEDURE WERE REVIEWED. RISKS AND BENEFITS OF SURGERY DISCUSSED WITH PATIENT AGAIN.  NO CHANGES FROM INITIAL HISTORY AND PHYSICAL NOTED.    

## 2015-03-26 NOTE — Progress Notes (Signed)
Pt tolerated being dangled at bedside 

## 2015-03-26 NOTE — Care Management (Addendum)
This is a Medicare Bundle patient through [employer]. I have notified [place] of patient surgery. Patient's Bundle Case Mgr is Alinda Money, RN, BSN Nurse Case [facility] now [facility] [phone] [email or web address]

## 2015-03-26 NOTE — Progress Notes (Signed)
Inpatient Diabetes Program Recommendations  AACE/ADA: New Consensus Statement on Inpatient Glycemic Control (2015)  Target Ranges:  Prepandial:   less than 140 mg/dL      Peak postprandial:   less than 180 mg/dL (1-2 hours)      Critically ill patients:  140 - 180 mg/dL   Review of Glycemic Control  Results for Cassandra Joyce, Cassandra Joyce (MRN [id number]) as of 03/26/2015 09:11  Ref. Range 01/25/2015 20:01 03/26/2015 03:22 03/26/2015 03:33 03/26/2015 05:59 03/26/2015 08:00  Glucose-Capillary Latest Ref Range: 65-99 mg/dL 213 (H) 222 (H) 222 (H) 311 (H) 275 (H)    Diabetes history: Type 1 Outpatient Diabetes medications: Novolog insulin via insulin pump Current orders for Inpatient glycemic control: none  Inpatient Diabetes Program Recommendations: Patient sees Dr. Blossom Hoops.  She last saw her on 03/12/15.  From Dr. Sammuel Hines notes: Ms. Cassandra Joyce is a very pleasant 70 y.o. female who has had history of type 1 diabetes since she was 70 years old which is complicated by retinopathy, nephropathy as well as neuropathy.  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  Most recent A1C of 7.0 in 03/17 from 6.8 in 12/16  Pump Settings:  Basal Rates:  Time Dose (u/hr)  12a 0.250  4a 0.325  8a 0.275  17p 0.525  22:30 0.425   24hr Basal: 8.3u   Bolus Settings:  I:C 12a 1:17 4p 1:16   SF 12a 85 4p 75 8p 85   TBG 12a 110-120 8a 110-125   Total Daily Insulin: 25u from 25u  Basal:Bolus: 37:63 from 39:61 Average carbs: 130g from 210g Active Insulin Time: 4h Changing site every 2-3 days  Spoke to Dr. Baruch Merl regarding my concern for elevated blood sugars and no insulin orders.  Patient is about to go to surgery. Recommended he discusses the amount of insulin that this patient uses to correct her elevated blood sugars. In reviewing the notes from Dr. Eddie Dibbles on 03/12/15, patient's 1 unit of insulin decreases the blood sugar by 85 points.  Called OR again and spoke to [place]- to relay to Dr. Andree Elk that 1 unit of Novolog  insulin decreases blood sugar by 85 points.   Recommend correction Novolog q4h with custom correction scale;  Blood sugar 150-250mg /dl = 1 units of Novolog             251-350mg /dl= 2 units of Novolog            351-450mg /dl= 3 units of Novolog            >451mg /dl     = 4 units of Novolog  Do not give insulin more often than q4h ** Patient is extremely sensitive to insulin**  Resume insulin pump when patient is out of surgery and is able to safely start and manage her pump. Most recent A1C is 7.0%   Gentry Fitz, RN, [place], [place], CDE Diabetes Coordinator Inpatient Diabetes Program  [phone] (Team Pager) [phone] ([place]) 03/26/2015 9:39 AM

## 2015-03-26 NOTE — ED Notes (Signed)
Pt returned from xray

## 2015-03-27 ENCOUNTER — Encounter: Payer: Self-pay | Admitting: Specialist

## 2015-03-27 LAB — BASIC METABOLIC PANEL
Anion gap: 2 — ABNORMAL LOW (ref 5–15)
BUN: 22 mg/dL — ABNORMAL HIGH (ref 6–20)
CO2: 24 mmol/L (ref 22–32)
Calcium: 7.8 mg/dL — ABNORMAL LOW (ref 8.9–10.3)
Chloride: 107 mmol/L (ref 101–111)
Creatinine, Ser: 0.81 mg/dL (ref 0.44–1.00)
GFR calc Af Amer: >60 mL/min (ref >60)
GFR calc non Af Amer: >60 mL/min (ref >60)
Glucose, Bld: 237 mg/dL — ABNORMAL HIGH (ref 65–99)
Potassium: 4.5 mmol/L (ref 3.5–5.1)
Sodium: 133 mmol/L — ABNORMAL LOW (ref 135–145)

## 2015-03-27 LAB — CBC
HCT: 28.7 % — ABNORMAL LOW (ref 35.0–47.0)
Hemoglobin: 9.6 g/dL — ABNORMAL LOW (ref 12.0–16.0)
MCH: 28.9 pg (ref 26.0–34.0)
MCHC: 33.5 g/dL (ref 32.0–36.0)
MCV: 86.1 fL (ref 80.0–100.0)
Platelets: 114 10*3/uL — ABNORMAL LOW (ref 150–440)
RBC: 3.33 MIL/uL — ABNORMAL LOW (ref 3.80–5.20)
RDW: 13.3 % (ref 11.5–14.5)
WBC: 8.7 10*3/uL (ref 3.6–11.0)

## 2015-03-27 LAB — GLUCOSE, CAPILLARY
Glucose-Capillary: 277 mg/dL — ABNORMAL HIGH (ref 65–99)
Glucose-Capillary: 228 mg/dL — ABNORMAL HIGH (ref 65–99)
Glucose-Capillary: 185 mg/dL — ABNORMAL HIGH (ref 65–99)
Glucose-Capillary: 314 mg/dL — ABNORMAL HIGH (ref 65–99)
Glucose-Capillary: 308 mg/dL — ABNORMAL HIGH (ref 65–99)
Glucose-Capillary: 270 mg/dL — ABNORMAL HIGH (ref 65–99)

## 2015-03-27 MED ORDER — INSULIN PUMP
Freq: Three times a day (TID) | SUBCUTANEOUS | Status: DC
  Administered 2015-03-27: 1.5 via SUBCUTANEOUS
  Administered 2015-03-27 (×2): via SUBCUTANEOUS
  Administered 2015-03-28: 0.8 via SUBCUTANEOUS
  Administered 2015-03-28 – 2015-03-29 (×3): via SUBCUTANEOUS
  Filled 2015-03-27: qty 1

## 2015-03-27 MED ORDER — FERROUS SULFATE 325 (65 FE) MG PO TABS: 325.0000 mg | ORAL_TABLET | Freq: Every day | ORAL | Status: DC

## 2015-03-27 MED ORDER — HYDROCODONE-ACETAMINOPHEN 5-325 MG PO TABS: 1.0000 | ORAL_TABLET | Freq: Four times a day (QID) | ORAL | Status: DC | PRN

## 2015-03-27 MED ORDER — MAGNESIUM HYDROXIDE 400 MG/5ML PO SUSP
30.0000 mL | Freq: Every day | ORAL | Status: DC | PRN
  Administered 2015-03-28: 30 mL via ORAL
  Filled 2015-03-27: qty 30

## 2015-03-27 MED ORDER — ENOXAPARIN SODIUM 40 MG/0.4ML ~~LOC~~ SOLN
40.0000 mg | SUBCUTANEOUS | Status: DC
  Administered 2015-03-28 – 2015-03-29 (×2): 40 mg via SUBCUTANEOUS
  Filled 2015-03-27 (×2): qty 0.4

## 2015-03-27 MED ORDER — ENOXAPARIN SODIUM 40 MG/0.4ML ~~LOC~~ SOLN: 40.0000 mg | SUBCUTANEOUS | Status: DC

## 2015-03-27 NOTE — Progress Notes (Signed)
Patient ID: Cassandra Joyce, female   DOB: [birth year], 70 y.o.   MRN: [id number] [facility] [place] NAME: Cassandra Joyce    MR#:  [id number]  DATE OF BIRTH:  [birth year]  SUBJECTIVE:  Came in after a fall at home. Found to have hip fracture. POD #1 Doing well Eating yoghurt  REVIEW OF SYSTEMS:   Review of Systems  Constitutional: Negative for fever, chills and weight loss.  HENT: Negative for ear discharge, ear pain and nosebleeds.   Eyes: Negative for blurred vision, pain and discharge.  Respiratory: Negative for sputum production, shortness of breath, wheezing and stridor.   Cardiovascular: Negative for chest pain, palpitations, orthopnea and PND.  Gastrointestinal: Negative for nausea, vomiting, abdominal pain and diarrhea.  Genitourinary: Negative for urgency and frequency.  Musculoskeletal: Positive for joint pain and falls. Negative for back pain.  Neurological: Positive for weakness. Negative for sensory change, speech change and focal weakness.  Psychiatric/Behavioral: Negative for depression and hallucinations. The patient is not nervous/anxious.   All other systems reviewed and are negative.  Tolerating Diet:yes Tolerating PT: pending  DRUG ALLERGIES:   Allergies  Allergen Reactions  . Augmentin [Amoxicillin-Pot Clavulanate]   . Mercury Swelling  . Penicillin G Nausea Only  . Sulfa Antibiotics     VITALS:  Blood pressure 111/48, pulse 66, temperature 99.1 F (37.3 C), temperature source Oral, resp. rate 19, weight 58.968 kg (130 lb), SpO2 97 %.  PHYSICAL EXAMINATION:   Physical Exam  GENERAL:  70 y.o.-year-old patient lying in the bed with no acute distress.  EYES: Pupils equal, round, reactive to light and accommodation. No scleral icterus. Extraocular muscles intact.  HEENT: Head atraumatic, normocephalic. Oropharynx and nasopharynx clear.  NECK:  Supple, no jugular venous distention. No thyroid enlargement,  no tenderness.  LUNGS: Normal breath sounds bilaterally, no wheezing, rales, rhonchi. No use of accessory muscles of respiration.  CARDIOVASCULAR: S1, S2 normal. No murmurs, rubs, or gallops.  ABDOMEN: Soft, nontender, nondistended. Bowel sounds present. No organomegaly or mass.  EXTREMITIES: No cyanosis, clubbing or edema b/l.    NEUROLOGIC: Cranial nerves II through XII are intact. No focal Motor or sensory deficits b/l.   PSYCHIATRIC:  patient is alert and oriented x 3.  SKIN: No obvious rash, lesion, or ulcer.   LABORATORY PANEL:  CBC  Recent Labs Lab 03/27/15 0513  WBC 8.7  HGB 9.6*  HCT 28.7*  PLT 114*    Chemistries   Recent Labs Lab 03/26/15 0313  03/27/15 0513  NA 135  --  133*  K 3.8  --  4.5  CL 103  --  107  CO2 24  --  24  GLUCOSE 217*  --  237*  BUN 23*  --  22*  CREATININE 0.81  --  0.81  CALCIUM 8.9  < > 7.8*  AST 27  --   --   ALT 20  --   --   ALKPHOS 64  --   --   BILITOT 0.5  --   --   < > = values in this interval not displayed. Cardiac Enzymes  Recent Labs Lab 03/26/15 0313  TROPONINI <0.03   RADIOLOGY:  Dg Chest 1 View  03/26/2015  CLINICAL DATA:  [place] down stairs tonight. Possible syncope. Elevated blood sugar. EXAM: CHEST 1 VIEW COMPARISON:  01/25/2015 FINDINGS: The heart size and mediastinal contours are within normal limits. Both lungs are clear. The visualized skeletal structures are unremarkable.  IMPRESSION: No active disease. Electronically Signed   By: Lucienne Capers M.D.   On: 03/26/2015 03:58   Dg Hip Unilat With Pelvis 2-3 Views Right  03/26/2015  CLINICAL DATA:  Hip fracture.  Hemiarthroplasty. EXAM: DG HIP (WITH OR WITHOUT PELVIS) 2-3V RIGHT COMPARISON:  03/26/2015 FINDINGS: Right hip hemiarthroplasty in satisfactory position and alignment. No acute fracture or hardware complication IMPRESSION: Satisfactory right hip hemiarthroplasty for fracture fixation. Electronically Signed   By: Franchot Gallo M.D.   On: 03/26/2015 14:07    Dg Hip Unilat  With Pelvis 2-3 Views Right  03/26/2015  CLINICAL DATA:  70 year old female with fall and right hip pain. EXAM: DG HIP (WITH OR WITHOUT PELVIS) 2-3V RIGHT COMPARISON:  CT dated 05/18/2012 FINDINGS: There is a mildly displaced transverse fracture of the right femoral neck. The head of the right femur remains in anatomic alignment with the right acetabulum. There is mild proximal migration of the right femoral shaft in relation to the head of the femur. The bones are osteopenic. No other acute fracture identified. 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 Moderate stool noted throughout the colon. IMPRESSION: Mildly displaced transverse fracture of the right femoral neck. Electronically Signed   By: Anner Crete M.D.   On: 03/26/2015 04:59   ASSESSMENT AND PLAN:  70 year old female past medical history of diabetes mellitus type 1 presents emergency department after suffering a fall down her stairs. She landed on her right side and immediately felt excruciating pain in her right leg. She had gotten up from bed because she felt as if her blood sugar were low. She does not remember the fall nor does she remember hitting her head  She is being admitted for right hip fracture.  1. Right hip fracture: s/p surgery POD # 1 -no history of coronary artery disease. She is moderate high risk due to her age.  -resume asa -incentive spirometer, PT to be initiated today  2. Essential hypertension: Continue lisinopril  3. Diabetes mellitus type 1 -resumed patient's insulin pump. Sugars improving  4. Hyperlipidemia: Continue simvastatin  5. DVT prophylaxis: Lovenox postoperatively  6. GI prophylaxis: None  Case discussed with Care Management/Social Worker. Management plans discussed with the patient, family and they are in agreement.  CODE STATUS: full  CSW for d/c planning  TOTAL TIME TAKING CARE OF THIS PATIENT30 minutes.  >50% time  spent on counselling and coordination of care  POSSIBLE D/C IN 2-3 DAYS, DEPENDING ON CLINICAL CONDITION.  Note: This dictation was prepared with Dragon dictation along with smaller phrase technology. Any transcriptional errors that result from this process are unintentional.  Dashun Borre M.D on 03/27/2015 at 10:48 AM  Between 7am to 6pm - Pager - [phone]  After 6pm go to [email or web address] - password [email or web address] [facility]  [facility]  Office  [phone]  CC: Primary care physician; Adrian Prows, MD

## 2015-03-27 NOTE — Progress Notes (Signed)
Inpatient Diabetes Program Recommendations  AACE/ADA: New Consensus Statement on Inpatient Glycemic Control (2015)  Target Ranges:  Prepandial:   less than 140 mg/dL      Peak postprandial:   less than 180 mg/dL (1-2 hours)      Critically ill patients:  140 - 180 mg/dL   Review of Glycemic Control  Results for Cassandra, Cassandra Joyce (MRN [id number]) as of 03/27/2015 08:06  Ref. Range 03/26/2015 15:18 03/26/2015 16:08 03/26/2015 20:54 03/27/2015 01:48 03/27/2015 06:02  Glucose-Capillary Latest Ref Range: 65-99 mg/dL 230 (H) 156 (H) 271 (H) 277 (H) 228 (H)   Diabetes history: Type 1 Outpatient Diabetes medications: Novolog insulin via insulin pump Current orders for Inpatient glycemic control: none  Inpatient Diabetes Program Recommendations:  Blood sugar stable overnight although running slightly high.  Insulin pump intact and functioning.    Recommend changing insulin delivery times to tid and hs now that she is eating so insulin timing matches her meal times and patient can rest through the night.  Gentry Fitz, RN, BA, MHA, CDE Diabetes Coordinator Inpatient Diabetes Program  [phone] (Team Pager) [phone] ([place]) 03/27/2015 8:07 AM

## 2015-03-27 NOTE — Evaluation (Signed)
Occupational Therapy Evaluation Patient Details Name: Cassandra Joyce MRN: [id number] DOB: Apr 10, 1945 Today's Date: 03/27/2015    History of Present Illness 70 year old female past medical history of diabetes mellitus type 1 presents emergency department after suffering a fall down her stairs. She landed on her right side and immediately felt excruciating pain in her right leg. She had gotten up from bed because she felt as if her blood sugar were low. She does not remember the fall nor does she remember hitting her head She is being admitted for right hip fractu13 year old female past medical history of diabetes mellitus type 1 presents emergency department after suffering a fall down her stairs. She landed on her right side and immediately felt excruciating pain in her right leg. She had gotten up from bed because she felt as if her blood sugar were low. She does not remember the fall nor does she remember hitting her head She is being admitted for right hip fractu   Clinical Impression  Pt is 70 year old female s/p Right THR (posterior approach)  who lives at home with her husband. Pt was independent in all ADLs prior to surgery and is eager to return to PLOF.  Pt is currently limited in functional ADLs due to pain, decreased ROM, nausea and vomiting.  Pt requires extensive assist for LB ADLs secondary to [facility] Traction,  posterior hip precautions, pain and decreased AROM of right LE. Pt. would benefit from continued skilled OT services for education in assistive devices, functional mobility, and education in recommendations for home modifications to increase safety and prevent falls.  Pt is a good candidate for SNF to continue rehabilitation.      Follow Up Recommendations       Equipment Recommendations  3 in 1 bedside comode;Tub/shower seat    Recommendations for Other Services PT consult     Precautions / Restrictions Precautions Precautions: Posterior Hip Precaution Comments: Pt. is  is Primary school teacher following surgery Restrictions Weight Bearing Restrictions: Yes RLE Weight Bearing: Partial weight bearing      Mobility Bed Mobility               General bed mobility comments: Pt. is Primary school teacher. Bedside eval completed.  Transfers                      Balance                                            ADL Overall ADL's : Needs assistance/impaired                                       General ADL Comments: Pt. has limitations with LE ADLs secondary to posterior hip precautions.     Vision     Perception     Praxis      Pertinent Vitals/Pain Pain Assessment: 0-10 Pain Score: 0-No pain Pain Location: Pt. reports no pain at rest, positive pain with movement, Not rated.     Hand Dominance Right   Extremity/Trunk Assessment Upper Extremity Assessment Upper Extremity Assessment: Overall WFL for tasks assessed           Communication Communication Communication: No difficulties   Cognition Arousal/Alertness: Awake/alert Behavior During Therapy: WFL for tasks assessed/performed Overall Cognitive  Status: Within Functional Limits for tasks assessed                     General Comments       Exercises       Shoulder Instructions      Home Living Family/patient expects to be discharged to:: Private residence Living Arrangements: Spouse/significant other Available Help at Discharge: Family Type of Home: House Home Access: Stairs to enter Technical brewer of Steps: 6 Entrance Stairs-Rails:  (one rail in garage entrance only) Home Layout: Two level     Bathroom Shower/Tub: English as a second language teacher characteristics: Door                  Prior Functioning/Environment Level of Independence: Independent        Comments: Avid gardening, perfromed yardwork, driving    OT Diagnosis: Generalized weakness   OT Problem List:     OT Treatment/Interventions:       OT Goals(Current goals can be found in the care plan section) Acute Rehab OT Goals Patient Stated Goal: To do what she did before OT Goal Formulation: With patient Time For Goal Achievement: 04/10/15 Potential to Achieve Goals: Good  OT Frequency:     Barriers to D/C:            Co-evaluation              End of Session Equipment Utilized During Treatment:  (A/E Reacher, sockaid, LH shoehorn)  Activity Tolerance: Patient tolerated treatment well Patient left: In bed, meal tray, call bell in reach.    [id number] OT Time Calculation (min): 27 min Charges:  OT General Charges $OT Visit: 1 Procedure OT Evaluation $OT Eval Low Complexity: 1 Procedure G-Codes:   Harrel Carina, MS, OTR/L  Harrel Carina 03/27/2015, 10:01 AM

## 2015-03-27 NOTE — Clinical Social Work Note (Signed)
Clinical Social Work Assessment  Patient Details  Name: Cassandra Joyce MRN: [id number] Date of Birth: 08-15-1945  Date of referral:  03/27/15               Reason for consult:  Facility Placement                Permission sought to share information with:  Chartered certified accountant granted to share information::  Yes, Verbal Permission Granted  Name::      [place]::   [place]   Relationship::     Contact Information:     Housing/Transportation Living arrangements for the past 2 months:  [place] of Information:  Patient, Spouse Patient Interpreter Needed:  None Criminal Activity/Legal Involvement Pertinent to Current Situation/Hospitalization:  No - Comment as needed Significant Relationships:  Spouse Lives with:  Spouse Do you feel safe going back to the place where you live?  Yes Need for family participation in patient care:  Yes (Comment)  Care giving concerns: Patient lives in [place] with [name].    Social Worker assessment / plan:  Holiday representative (CSW) received SNF consult. PT is recommending SNF. Patient is a Water quality scientist is Cassandra Joyce [phone]. CSW met with patient and [name] was at bedside. CSW introduced self and explained role of CSW department. Patient was alert and oriented and sitting up in the chair. Per patient she lives in [place] area with her husband. CSW explained that PT is recommending SNF. Patient and husband are agreeable to SNF search in [place].   FL2 complete and faxed out. CSW presented bed offers to patient. She chose [facility]. [facility] admissions coordinator at [facility] is aware of accepted bed offer.   Employment status:  Retired Forensic scientist:  Medicare (Medicare Bundle ) PT Recommendations:  [place] / Referral to community resources:  [place]  Patient/Family's Response to care:  Patient and husband are agreeable for patient to go to [facility].   Patient/Family's Understanding of and Emotional Response to Diagnosis, Current Treatment, and Prognosis:  Patient and husband were pleasant throughout assessment and thanked CSW for visit.   Emotional Assessment Appearance:  Appears stated age Attitude/Demeanor/Rapport:    Affect (typically observed):  Accepting, Adaptable, Pleasant Orientation:  Oriented to Self, Oriented to Place, Oriented to  Time, Oriented to Situation Alcohol / Substance use:  Not Applicable Psych involvement (Current and /or in the community):  No (Comment)  Discharge Needs  Concerns to be addressed:  Discharge Planning Concerns Readmission within the last 30 days:  No Current discharge risk:  Dependent with Mobility Barriers to Discharge:  Continued Medical Work up   Elwyn Reach 03/27/2015, 1:43 PM

## 2015-03-27 NOTE — Evaluation (Signed)
Physical Therapy Evaluation Patient Details Name: Cassandra Joyce MRN: [id number] DOB: Jan 26, 1945 Today's Date: 03/27/2015   History of Present Illness  70 yo F presented to ED after falling down the stairs at her home after she got out ot bed to get food because she thought her blood sugar was low. She had imediate excrutiating pain in R hip. She was found to have a R hip fx and is s/p hemiarthroplasty. She has posterior hip precautions and is PWB on R LE. PMH includes DM, and HTN.   Clinical Impression  Pt demonstrated generalized weakness and difficulty walking due to R hip fx s/p surgery with significant pain. She requires mod A +2 for bed mobility, min A +2 assistance for equipment/safety for transfers and limited ambulation of 5 ft with FWW. Frequent cues given for maintaining posterior hip precautions and PWB of R LE. STR is recommended as pt is very limited in mobility, is PWB, and has an inaccessible home environment. Pt will benefit from skilled PT services to increase functional I and mobility for safe discharge.     Follow Up Recommendations SNF    Equipment Recommendations  None recommended by PT (pt stated she has FWW)    Recommendations for Other Services       Precautions / Restrictions Precautions Precautions: Posterior Hip Precaution Comments: bucks traction Restrictions Weight Bearing Restrictions: Yes RLE Weight Bearing: Partial weight bearing      Mobility  Bed Mobility Overal bed mobility: +2 for physical assistance;Needs Assistance Bed Mobility: Supine to Sit     Supine to sit: HOB elevated;Mod assist;+2 for physical assistance     General bed mobility comments: uses rail; difficulty moving R LE and getting trunk up right  Transfers Overall transfer level: Needs assistance Equipment used: Rolling walker (2 wheeled) Transfers: Sit to/from [facility] Sit to Stand: Min assist;From elevated surface Stand pivot transfers: Min assist;From  elevated surface       General transfer comment: cues for hand placement, maintaining hip precautions and PWB, sequence  Ambulation/Gait Ambulation/Gait assistance: Min assist;+2 safety/equipment Ambulation Distance (Feet): 5 Feet Assistive device: Rolling walker (2 wheeled) Gait Pattern/deviations: Step-to pattern;Narrow base of support     General Gait Details: Frequent cues for hip precautions, PWB sequence and staying close to [place]            Wheelchair Mobility    Modified Rankin (Stroke Patients Only)       Balance Overall balance assessment: Needs assistance;History of Falls Sitting-balance support: Bilateral upper extremity supported;Feet supported Sitting balance-Leahy Scale: Fair Sitting balance - Comments: poor posture Postural control: Posterior lean Standing balance support: Bilateral upper extremity supported Standing balance-Leahy Scale: Fair Standing balance comment: static standing unsteady                             Pertinent Vitals/Pain Pain Assessment: 0-10 Pain Score: 10-Worst pain ever Pain Location: R hip Pain Descriptors / Indicators: Aching Pain Intervention(s): Limited activity within patient's tolerance;Monitored during session;Premedicated before session    Home Living Family/patient expects to be discharged to:: Private residence Living Arrangements: Spouse/significant other Available Help at Discharge: Family Type of Home: House Home Access: Stairs to enter Entrance Stairs-Rails: Right Entrance Stairs-Number of Steps: 6 Home Layout: Two level (there are no bedrooms on 1st floor) Home Equipment: Walker - 2 wheels      Prior Function Level of Independence: Independent  Comments: Avid gardening, perfromed yardwork, driving     Hand Dominance   Dominant Hand: Right    Extremity/Trunk Assessment   Upper Extremity Assessment: Overall WFL for tasks assessed           Lower Extremity  Assessment: Generalized weakness;RLE deficits/detail RLE Deficits / Details: strength grossly 3+/5, limited by pain       Communication   Communication: No difficulties  Cognition Arousal/Alertness: Awake/alert Behavior During Therapy: WFL for tasks assessed/performed Overall Cognitive Status: Within Functional Limits for tasks assessed                      General Comments      Exercises Other Exercises Other Exercises: B LE supine ankle pumps, QS; R LE seated therex: LAQs x20 each AAROM. Cues for technique. Other Exercises: Multiple STS and stand pivot transfers with FWW and min A +2 assistance for equipment/safety. Other Exercises: Extensive review of posterior hip precautions and PWB of R LE and the importance of maintaining them. Pt and husband verbalized understanding. Pt was later able to recall 2/3.      Assessment/Plan    PT Assessment Patient needs continued PT services  PT Diagnosis Difficulty walking;Generalized weakness   PT Problem List Decreased strength;Decreased activity tolerance;Decreased balance;Decreased mobility;Decreased knowledge of use of DME;Decreased safety awareness;Decreased knowledge of precautions;Pain  PT Treatment Interventions Gait training;DME instruction;Stair training;Therapeutic activities;Therapeutic exercise;Balance training;Neuromuscular re-education;Patient/family education   PT Goals (Current goals can be found in the Care Plan section) Acute Rehab PT Goals Patient Stated Goal: To do what she did before PT Goal Formulation: With patient/family Time For Goal Achievement: 04/10/15 Potential to Achieve Goals: Good    Frequency BID   Barriers to discharge Inaccessible home environment Pt has many steps to enter house as well as all bedrooms are on the second floor with many steps to get to. Pt is PWB with posterior hip precautions.    Co-evaluation               End of Session Equipment Utilized During Treatment: Gait  belt Activity Tolerance: Patient limited by pain Patient left: in chair;with call bell/phone within reach;with chair alarm set;with family/visitor present;with SCD's reapplied Nurse Communication: Mobility status         Time: [id number] PT Time Calculation (min) (ACUTE ONLY): 39 min   Charges:   PT Evaluation $PT Eval Moderate Complexity: 1 Procedure PT Treatments $Therapeutic Exercise: 8-22 mins $Therapeutic Activity: 8-22 mins   PT G Codes:        Neoma Laming, PT, DPT  03/27/2015, 1:10 PM [phone]

## 2015-03-27 NOTE — Care Management (Signed)
Spoke with patient's husband and he states he has spoken to [employer] with medicare bundle and all have agreed to SNF. He states she has a walker available for use. No RNCM needs at this time. Bethena Roys will arrange home PT if patient progresses.

## 2015-03-27 NOTE — Progress Notes (Signed)
Subjective: 1 Day Post-Op Procedure(s) (LRB): ARTHROPLASTY BIPOLAR HIP (HEMIARTHROPLASTY) (Right)    Patient reports pain as mild. oob in chair.  Hip stable.  Started PT  Objective:   VITALS:   Filed Vitals:   03/27/15 0814 03/27/15 1228  BP: 111/48   Pulse: 66 68  Temp: 99.1 F (37.3 C)   Resp:      Neurologically intact Neurovascular intact Sensation intact distally Intact pulses distally Dorsiflexion/Plantar flexion intact dressing dry.  LABS  Recent Labs  03/26/15 0313 03/27/15 0513  HGB 12.1 9.6*  HCT 36.3 28.7*  WBC 12.1* 8.7  PLT 147* 114*     Recent Labs  03/26/15 0313 03/27/15 0513  NA 135 133*  K 3.8 4.5  BUN 23* 22*  CREATININE 0.81 0.81  GLUCOSE 217* 237*     Recent Labs  03/26/15 0547  INR 1.21     Assessment/Plan: 1 Day Post-Op Procedure(s) (LRB): ARTHROPLASTY BIPOLAR HIP (HEMIARTHROPLASTY) (Right)   Advance diet Up with therapy D/C IV fluids  Home in 2-3 days

## 2015-03-27 NOTE — Clinical Social Work Placement (Signed)
   CLINICAL SOCIAL WORK PLACEMENT  NOTE  Date:  03/27/2015  Patient Details  Name: Cassandra Joyce MRN: [id number] Date of Birth: [birth year]  Clinical Social Work is seeking post-discharge placement for this patient at the [place] level of care (*CSW will initial, date and re-position this form in  chart as items are completed):  Yes   Patient/family provided with [facility] Work Department's list of facilities offering this level of care within the geographic area requested by the patient (or if unable, by the patient's family).  Yes   Patient/family informed of their freedom to choose among providers that offer the needed level of care, that participate in Medicare, Medicaid or managed care program needed by the patient, have an available bed and are willing to accept the patient.  Yes   Patient/family informed of [facility]'s ownership interest in [facility] and [facility], as well as of the fact that they are under no obligation to receive care at these facilities.  PASRR submitted to EDS on 03/26/15     PASRR number received on 03/26/15     Existing PASRR number confirmed on       FL2 transmitted to all facilities in geographic area requested by pt/family on 03/27/15     FL2 transmitted to all facilities within larger geographic area on       Patient informed that his/her managed care company has contracts with or will negotiate with certain facilities, including the following:        Yes   Patient/family informed of bed offers received.  Patient chooses bed at  [facility] )     Physician recommends and patient chooses bed at      Patient to be transferred to   on  .  Patient to be transferred to facility by       Patient family notified on   of transfer.  Name of family member notified:        PHYSICIAN       Additional Comment:    _______________________________________________ Loralyn Freshwater, LCSW 03/27/2015,  1:41 PM

## 2015-03-27 NOTE — Progress Notes (Signed)
Plan is for patient to D/C to [facility] Sunday 03/29/15 pending medical clearance. Per Seth Bake admissions coordinator at [facility] patient will go to room 326. RN will call report at [phone]. Clinical Education officer, museum (CSW) sent D/C orders and perscription to [facility] today. Medicare Bundle Case Manager Bethena Roys is aware of above. Patient and her husband Gershon Mussel is aware of above. CSW will continue to follow and assist as needed.   Blima Rich, LCSW [phone]

## 2015-03-27 NOTE — Progress Notes (Signed)
Physical Therapy Treatment Patient Details Name: Cassandra Joyce MRN: [id number] DOB: 03/20/1945 Today's Date: 03/27/2015    History of Present Illness 70 yo F presented to ED after falling down the stairs at her home after she got out ot bed to get food because she thought her blood sugar was low. She had imediate excrutiating pain in R hip. She was found to have a R hip fx and is s/p hemiarthroplasty. She has posterior hip precautions and is PWB on R LE. PMH includes DM, and HTN.     PT Comments    Pt demonstrated progress towards goals. She was able to ambulate up to 70 ft with FWW and min A with +2 assistance for equipment/safety. STS is most difficult for pt due to pain, weakness and hip precautions and requires modA. Frequent cues given for hip precautions and PWB technique during transfers and ambulation. She will benefit from continued skilled PT to progress towards PLOF.  Follow Up Recommendations  SNF     Equipment Recommendations  None recommended by PT    Recommendations for Other Services       Precautions / Restrictions Precautions Precautions: Posterior Hip Precaution Comments: bucks traction Restrictions Weight Bearing Restrictions: Yes RLE Weight Bearing: Partial weight bearing    Mobility  Bed Mobility Overal bed mobility: +2 for physical assistance;Needs Assistance Bed Mobility: Supine to Sit     Supine to sit: HOB elevated;Mod assist;+2 for physical assistance     General bed mobility comments: pt received in recliner, NT  Transfers Overall transfer level: Needs assistance Equipment used: Rolling walker (2 wheeled) Transfers: Sit to/from [facility] Sit to Stand: Mod assist Stand pivot transfers: Mod assist       General transfer comment: cues for hand placement, maintaining hip precautions and PWB, sequence  Ambulation/Gait Ambulation/Gait assistance: Min assist;+2 safety/equipment Ambulation Distance (Feet): 70 Feet Assistive  device: Rolling walker (2 wheeled) Gait Pattern/deviations: Step-to pattern;Antalgic Gait velocity: reduced   General Gait Details: Frequent cues for hip precautions, PWB sequence and staying close to [place] Mobility    Modified Rankin (Stroke Patients Only)       Balance Overall balance assessment: Needs assistance;History of Falls Sitting-balance support: Bilateral upper extremity supported;Feet supported Sitting balance-Leahy Scale: Fair Sitting balance - Comments: poor posture Postural control: Posterior lean Standing balance support: Bilateral upper extremity supported Standing balance-Leahy Scale: Fair Standing balance comment: maintains static standing                    Cognition Arousal/Alertness: Awake/alert Behavior During Therapy: WFL for tasks assessed/performed Overall Cognitive Status: Within Functional Limits for tasks assessed                      Exercises Other Exercises Other Exercises: B LE supine ankle pumps, hip abd with manual resistance, hip add pillow squeezes; R LE seated therex: LAQs and hip flexion (within limits) x20 each AAROM. Cues for technique. Other Exercises: Pt ambulated 70 ft with FWW and minA +2 assistance for equipment/safety. Slow but steady gait with no LOB. Cues for sequence, maintaining hip precautions and PWB.  Other Exercises: Reviewed posterior hip precautions. Pt recalled 3/3.    General Comments        Pertinent Vitals/Pain Pain Assessment: 0-10 Pain Score: 4  Pain Location: R hip Pain Descriptors / Indicators: Aching Pain Intervention(s): Limited activity within patient's tolerance;Monitored during  session    Home Living Family/patient expects to be discharged to:: Private residence Living Arrangements: Spouse/significant other Available Help at Discharge: Family Type of Home: House Home Access: Stairs to enter Entrance Stairs-Rails: Right Home Layout: Two level  (there are no bedrooms on 1st floor) Home Equipment: Walker - 2 wheels      Prior Function Level of Independence: Independent      Comments: Avid gardening, perfromed yardwork, driving   PT Goals (current goals can now be found in the care plan section) Acute Rehab PT Goals Patient Stated Goal: To do what she did before PT Goal Formulation: With patient/family Time For Goal Achievement: 04/10/15 Potential to Achieve Goals: Good Progress towards PT goals: Progressing toward goals    Frequency  BID    PT Plan Current plan remains appropriate    Co-evaluation             End of Session Equipment Utilized During Treatment: Gait belt Activity Tolerance: Patient limited by pain Patient left: in chair;with call bell/phone within reach;with chair alarm set;with family/visitor present;with SCD's reapplied     Time: [id number] PT Time Calculation (min) (ACUTE ONLY): 26 min  Charges:  $Gait Training: 8-22 mins $Therapeutic Exercise: 8-22 mins $Therapeutic Activity: 8-22 mins                    G Codes:      Cassandra Joyce, PT, DPT  03/27/2015, 3:40 PM [phone]

## 2015-03-27 NOTE — Progress Notes (Signed)
Sent note to pharmacy to send scheduled dose of clindamycin

## 2015-03-28 LAB — BASIC METABOLIC PANEL
Anion gap: 3 — ABNORMAL LOW (ref 5–15)
BUN: 17 mg/dL (ref 6–20)
CALCIUM: 8.2 mg/dL — AB (ref 8.9–10.3)
CO2: 25 mmol/L (ref 22–32)
CREATININE: 0.71 mg/dL (ref 0.44–1.00)
Chloride: 108 mmol/L (ref 101–111)
GFR calc non Af Amer: >60 mL/min (ref >60)
GLUCOSE: 162 mg/dL — AB (ref 65–99)
Potassium: 4.4 mmol/L (ref 3.5–5.1)
Sodium: 136 mmol/L (ref 135–145)

## 2015-03-28 LAB — GLUCOSE, CAPILLARY
Glucose-Capillary: 175 mg/dL — ABNORMAL HIGH (ref 65–99)
Glucose-Capillary: 157 mg/dL — ABNORMAL HIGH (ref 65–99)
Glucose-Capillary: 311 mg/dL — ABNORMAL HIGH (ref 65–99)
Glucose-Capillary: 322 mg/dL — ABNORMAL HIGH (ref 65–99)
Glucose-Capillary: 295 mg/dL — ABNORMAL HIGH (ref 65–99)
Glucose-Capillary: 236 mg/dL — ABNORMAL HIGH (ref 65–99)

## 2015-03-28 LAB — CBC
HEMATOCRIT: 27.3 % — AB (ref 35.0–47.0)
Hemoglobin: 9.1 g/dL — ABNORMAL LOW (ref 12.0–16.0)
MCH: 28.5 pg (ref 26.0–34.0)
MCHC: 33.2 g/dL (ref 32.0–36.0)
MCV: 85.9 fL (ref 80.0–100.0)
Platelets: 107 10*3/uL — ABNORMAL LOW (ref 150–440)
RBC: 3.18 MIL/uL — ABNORMAL LOW (ref 3.80–5.20)
RDW: 13.1 % (ref 11.5–14.5)
WBC: 8.5 10*3/uL (ref 3.6–11.0)

## 2015-03-28 NOTE — Progress Notes (Signed)
Subjective: 2 Days Post-Op Procedure(s) (LRB): ARTHROPLASTY BIPOLAR HIP (HEMIARTHROPLASTY) (Right)    Patient reports pain as mild. Working with PT.  Will go home 1-2 days.    Objective:   VITALS:   Filed Vitals:   03/28/15 0538 03/28/15 0716  BP: 116/47 119/49  Pulse: 69 70  Temp: 98.2 F (36.8 C) 98.1 F (36.7 C)  Resp: 19 18    Neurologically intact ABD soft Neurovascular intact Sensation intact distally Intact pulses distally Dorsiflexion/Plantar flexion intact Dressing dry.  csm good.  skin intact.  oob in chair   LABS  Recent Labs  03/26/15 0313 03/27/15 0513 03/28/15 0338  HGB 12.1 9.6* 9.1*  HCT 36.3 28.7* 27.3*  WBC 12.1* 8.7 8.5  PLT 147* 114* 107*     Recent Labs  03/26/15 0313 03/27/15 0513 03/28/15 0338  NA 135 133* 136  K 3.8 4.5 4.4  BUN 23* 22* 17  CREATININE 0.81 0.81 0.71  GLUCOSE 217* 237* 162*     Recent Labs  03/26/15 0547  INR 1.21     Assessment/Plan: 2 Days Post-Op Procedure(s) (LRB): ARTHROPLASTY BIPOLAR HIP (HEMIARTHROPLASTY) (Right)   Advance diet Up with therapy Discharge home with home health

## 2015-03-28 NOTE — Progress Notes (Signed)
Physical Therapy Treatment Patient Details Name: Cassandra Joyce MRN: [id number] DOB: [birth year] Today's Date: 03/28/2015    History of Present Illness 70 yo F presented to ED after falling down the stairs at her home after she got out ot bed to get food because she thought her blood sugar was low. She had imediate excrutiating pain in R hip. She was found to have a R hip fx and is s/p hemiarthroplasty. She has posterior hip precautions and is PWB on R LE. PMH includes DM, and HTN.     PT Comments    Patient presented with 10/10 pain for PM treatment and was able to ambulate 70 feet and perform AAROM and AROM to RLE in supine. She needs mod assist for transfers sit to stand and modified independent for gait with RW and PWB RLE . She continues to benefit from skilled PT to improve her mobility and strength.   Follow Up Recommendations  SNF     Equipment Recommendations  None recommended by PT    Recommendations for Other Services       Precautions / Restrictions Precautions Precautions: Posterior Hip Precaution Booklet Issued: No Restrictions Weight Bearing Restrictions: Yes RUE Weight Bearing: Partial weight bearing RLE Weight Bearing: Partial weight bearing    Mobility  Bed Mobility Overal bed mobility: Needs Assistance Bed Mobility: Supine to Sit     Supine to sit: Mod assist     General bed mobility comments: pt received in recliner, NT  Transfers Overall transfer level: Needs assistance Equipment used: Rolling walker (2 wheeled) Transfers: Sit to/from Stand Sit to Stand: Mod assist Stand pivot transfers: Min guard       General transfer comment: cues for hand placement, maintaining hip precautions and PWB, sequence  Ambulation/Gait Ambulation/Gait assistance: Modified independent (Device/Increase time) Ambulation Distance (Feet): 75 Feet Assistive device: Rolling walker (2 wheeled) Gait Pattern/deviations: Step-to pattern Gait velocity: reduced Gait  velocity interpretation: <1.8 ft/sec, indicative of risk for recurrent falls General Gait Details: Frequent cues for hip precautions, PWB sequence and staying close to [place] Mobility    Modified Rankin (Stroke Patients Only)       Balance Overall balance assessment: Needs assistance Sitting-balance support: Single extremity supported Sitting balance-Leahy Scale: Good Sitting balance - Comments: poor posture   Standing balance support: Single extremity supported Standing balance-Leahy Scale: Fair                      Cognition Arousal/Alertness: Awake/alert Behavior During Therapy: WFL for tasks assessed/performed Overall Cognitive Status: Within Functional Limits for tasks assessed                      Exercises Other Exercises Other Exercises: B LE supine ankle pumps, hip abd with manual resistance, hip add pillow squeezes; R LE seated therex: LAQs and hip flexion (within limits) x20 each AAROM. Cues for technique. Other Exercises: Pt ambulated 70 ft with FWW and minA +2 assistance for equipment/safety. Slow but steady gait with no LOB. Cues for sequence, maintaining hip precautions and PWB.  Other Exercises: Reviewed posterior hip precautions. Pt recalled 3/3.    General Comments        Pertinent Vitals/Pain Pain Assessment: 0-10 Pain Score: 10-Worst pain ever Pain Location: R leg Pain Descriptors / Indicators: Aching Pain Intervention(s): Limited activity within patient's tolerance    Home Living  Prior Function            PT Goals (current goals can now be found in the care plan section) Acute Rehab PT Goals Patient Stated Goal: To do what she did before PT Goal Formulation: With patient/family Time For Goal Achievement: 04/10/15 Potential to Achieve Goals: Good Progress towards PT goals: Progressing toward goals    Frequency  BID    PT Plan Current plan remains  appropriate    Co-evaluation             End of Session Equipment Utilized During Treatment: Gait belt Activity Tolerance: Patient limited by pain Patient left: in chair;with call bell/phone within reach;with chair alarm set;with family/visitor present;with SCD's reapplied     Time: 1430-1455 PT Time Calculation (min) (ACUTE ONLY): 25 min  Charges:  $Gait Training: 8-22 mins $Therapeutic Exercise: 8-22 mins                    G Codes:     Alanson Puls, PT, DPT [place], [place] S 03/28/2015, 3:35 PM

## 2015-03-28 NOTE — Progress Notes (Signed)
Patient ID: Cassandra Joyce, female   DOB: 1945/02/06, 70 y.o.   MRN: [id number] [place] [facility] [place] NAME: Cassandra Joyce    MR#:  [id number]  DATE OF BIRTH:  1945-02-21  SUBJECTIVE:   Came in after a fall at home. Found to have right hip fracture. POD #2 today. Tolerating physical therapy well. Still having some mild right-sided hip pain.  REVIEW OF SYSTEMS:   Review of Systems  Constitutional: Negative for fever, chills and weight loss.  HENT: Negative for ear discharge, ear pain and nosebleeds.   Eyes: Negative for blurred vision, pain and discharge.  Respiratory: Negative for sputum production, shortness of breath, wheezing and stridor.   Cardiovascular: Negative for chest pain, palpitations, orthopnea and PND.  Gastrointestinal: Negative for nausea, vomiting, abdominal pain and diarrhea.  Genitourinary: Negative for urgency and frequency.  Musculoskeletal: Positive for joint pain and falls. Negative for back pain.  Neurological: Positive for weakness. Negative for sensory change, speech change and focal weakness.  Psychiatric/Behavioral: Negative for depression and hallucinations. The patient is not nervous/anxious.   All other systems reviewed and are negative.  Tolerating Diet: Yes heart healthy Tolerating PT:  Evaluation noted DRUG ALLERGIES:   Allergies  Allergen Reactions  . Augmentin [Amoxicillin-Pot Clavulanate]   . Mercury Swelling  . Penicillin G Nausea Only  . Sulfa Antibiotics     VITALS:  Blood pressure 128/47, pulse 72, temperature 98 F (36.7 C), temperature source Oral, resp. rate 16, weight 58.968 kg (130 lb), SpO2 98 %.  PHYSICAL EXAMINATION:   Physical Exam  GENERAL:  70 y.o.-year-old patient up in the chair in no acute distress.  EYES: Pupils equal, round, reactive to light and accommodation. No scleral icterus. Extraocular muscles intact.  HEENT: Head atraumatic, normocephalic. Oropharynx and  nasopharynx clear.  NECK:  Supple, no jugular venous distention. No thyroid enlargement, no tenderness.  LUNGS: Normal breath sounds bilaterally, no wheezing, rales, rhonchi. No use of accessory muscles of respiration.  CARDIOVASCULAR: S1, S2 normal. No murmurs, rubs, or gallops.  ABDOMEN: Soft, nontender, nondistended. Bowel sounds present. No organomegaly or mass.  EXTREMITIES: No cyanosis, clubbing or mild pitting edema bilaterally.  Right hip dressing in place.    NEUROLOGIC: Cranial nerves II through XII are intact. No focal Motor or sensory deficits b/l.   PSYCHIATRIC:  patient is alert and oriented x 3.  SKIN: No obvious rash, lesion, or ulcer.   LABORATORY PANEL:  CBC  Recent Labs Lab 03/28/15 0338  WBC 8.5  HGB 9.1*  HCT 27.3*  PLT 107*    Chemistries   Recent Labs Lab 03/26/15 0313  03/28/15 0338  NA 135  < > 136  K 3.8  < > 4.4  CL 103  < > 108  CO2 24  < > 25  GLUCOSE 217*  < > 162*  BUN 23*  < > 17  CREATININE 0.81  < > 0.71  CALCIUM 8.9  < > 8.2*  AST 27  --   --   ALT 20  --   --   ALKPHOS 64  --   --   BILITOT 0.5  --   --   < > = values in this interval not displayed. Cardiac Enzymes  Recent Labs Lab 03/26/15 0313  TROPONINI <0.03   RADIOLOGY:  No results found. ASSESSMENT AND PLAN:  70 year old female past medical history of diabetes mellitus type 1 presents emergency department after suffering a fall down  her stairs. She landed on her right side and immediately felt excruciating pain in her right leg. She had gotten up from bed because she felt as if her blood sugar were low. She does not remember the fall nor does she remember hitting her head  She is being admitted for right hip fracture.  1. Right hip fracture: s/p ORIF of right Hip POD # 2 -Continue pain control and care as per orthopedics. -Seen by physical therapy and recommended short-term rehabilitation and will likely discharge her tomorrow.  2. Essential hypertension: Continue  enalapril -BP stable  3. Diabetes mellitus type 1 She'll continue patient's insulin pump. Blood sugar stable.  4. Hyperlipidemia: Continue simvastatin  5. DVT prophylaxis: Continue Lovenox  6. Insomnia - cont. PRN Ambien.   Case discussed with Care Management/Social Worker. Management plans discussed with the patient, family and they are in agreement.  CODE STATUS: full  CSW for d/c planning  TOTAL TIME TAKING CARE OF THIS PATIENT: 25 minutes.    POSSIBLE D/C IN 2-3 DAYS, DEPENDING ON CLINICAL CONDITION.  Note: This dictation was prepared with Dragon dictation along with smaller phrase technology. Any transcriptional errors that result from this process are unintentional.  Henreitta Leber M.D on 03/28/2015 at 1:33 PM  Between 7am to 6pm - Pager - [phone]  After 6pm go to [email or web address] - password [email or web address] [facility]  [facility]  Office  [phone]  CC: Primary care physician; Leonel Ramsay, MD

## 2015-03-29 ENCOUNTER — Encounter: Payer: Self-pay | Admitting: Specialist

## 2015-03-29 LAB — GLUCOSE, CAPILLARY
Glucose-Capillary: 198 mg/dL — ABNORMAL HIGH (ref 65–99)
Glucose-Capillary: 238 mg/dL — ABNORMAL HIGH (ref 65–99)

## 2015-03-29 LAB — CBC
HCT: 26.3 % — ABNORMAL LOW (ref 35.0–47.0)
Hemoglobin: 8.7 g/dL — ABNORMAL LOW (ref 12.0–16.0)
MCH: 28.6 pg (ref 26.0–34.0)
MCHC: 33.3 g/dL (ref 32.0–36.0)
MCV: 85.9 fL (ref 80.0–100.0)
PLATELETS: 116 10*3/uL — AB (ref 150–440)
RBC: 3.06 MIL/uL — ABNORMAL LOW (ref 3.80–5.20)
RDW: 13.2 % (ref 11.5–14.5)
WBC: 9.5 10*3/uL (ref 3.6–11.0)

## 2015-03-29 NOTE — Discharge Summary (Signed)
[place] at [place] NAME: Cassandra Joyce    MR#:  [id number]  DATE OF BIRTH:  02/12/1945  DATE OF ADMISSION:  03/26/2015 ADMITTING PHYSICIAN: Harrie Foreman, MD  DATE OF DISCHARGE: 03/29/2015  PRIMARY CARE PHYSICIAN: Leonel Ramsay, MD    ADMISSION DIAGNOSIS:  Type 1 diabetes mellitus without complication ([place]) 0000000 Fall, initial encounter [W19.XXXA] Hip fracture, right, closed, initial encounter ([place]) [S72.001A]  DISCHARGE DIAGNOSIS:  Active Problems:   Hip fracture ([place])   SECONDARY DIAGNOSIS:   Past Medical History  Diagnosis Date  . Diabetes mellitus type 1 ([place])   . Essential (primary) hypertension   . Hyperlipidemia   . Osteoarthritis     HOSPITAL COURSE:   70 year old female past medical history of diabetes mellitus type 1 presents emergency department after suffering a fall down her stairs. She landed on her right side and immediately felt excruciating pain in her right leg. She had gotten up from bed because she felt as if her blood sugar were low. She does not remember the fall nor does she remember hitting her head She is being admitted for right hip fracture.  1. Right hip fracture: s/p ORIF of right Hip POD # 3 - Patient's pain now is well controlled with oral medications. She is tolerating physical therapy well. She is being discharged to a skilled nursing facility for further rehabilitation. -She will continue Lovenox for DVT prophylaxis.  2. Essential hypertension: She will Continue enalapril  3. Diabetes mellitus type 1-patient is on insulin pump and she will resume that upon discharge. Her blood sugars have remained stable.  4. Hyperlipidemia: She will Continue simvastatin  5. Nausea-this is probably related to some mild constipation also from the pain medication she is receiving. -She will continue as needed Zofran. She will be given a Dulcolax suppository to help with her  constipation.  Patient is being discharged to SNF for further rehab.   DISCHARGE CONDITIONS:   Stable.   CONSULTS OBTAINED:  Treatment Team:  Earnestine Leys, MD  DRUG ALLERGIES:   Allergies  Allergen Reactions  . Augmentin [Amoxicillin-Pot Clavulanate]   . Mercury Swelling  . Penicillin G Nausea Only  . Sulfa Antibiotics     DISCHARGE MEDICATIONS:   Current Discharge Medication List    START taking these medications   Details  enoxaparin (LOVENOX) 40 MG/0.4ML injection Inject 0.4 mLs (40 mg total) into the skin daily. Qty: 14 Syringe, Refills: 0    ferrous sulfate 325 (65 FE) MG tablet Take 1 tablet (325 mg total) by mouth daily with breakfast. Qty: 30 tablet, Refills: 3    HYDROcodone-acetaminophen (NORCO/VICODIN) 5-325 MG tablet Take 1-2 tablets by mouth every 6 (six) hours as needed for moderate pain. Qty: 30 tablet, Refills: 0      CONTINUE these medications which have NOT CHANGED   Details  aspirin 81 MG tablet Take 81 mg by mouth daily.    cholecalciferol (VITAMIN D) 400 UNITS TABS tablet Take 1,000 Units by mouth daily.    enalapril (VASOTEC) 2.5 MG tablet Take 2.5 mg by mouth.    fluticasone (FLONASE) 50 MCG/ACT nasal spray Place 2 sprays into both nostrils daily.    insulin lispro (HUMALOG) 100 UNIT/ML injection Inject 35 Units into the skin once. Pt uses insulin pump    omeprazole (PRILOSEC) 40 MG capsule Take 40 mg by mouth daily.    simvastatin (ZOCOR) 40 MG tablet Take 40 mg by mouth daily.  ondansetron (ZOFRAN) 4 MG tablet Take 1 tablet (4 mg total) by mouth daily as needed for nausea or vomiting. Qty: 20 tablet, Refills: 0         DISCHARGE INSTRUCTIONS:   DIET:  Cardiac diet and Diabetic diet  DISCHARGE CONDITION:  Stable  ACTIVITY:  Activity as tolerated  OXYGEN:  Home Oxygen: No.   Oxygen Delivery: room air  DISCHARGE LOCATION:  nursing home   If you experience worsening of your admission symptoms, develop shortness of  breath, life threatening emergency, suicidal or homicidal thoughts you must seek medical attention immediately by calling 911 or calling your MD immediately  if symptoms less severe.  You Must read complete instructions/literature along with all the possible adverse reactions/side effects for all the Medicines you take and that have been prescribed to you. Take any new Medicines after you have completely understood and accpet all the possible adverse reactions/side effects.   Please note  You were cared for by a hospitalist during your hospital stay. If you have any questions about your discharge medications or the care you received while you were in the hospital after you are discharged, you can call the unit and asked to speak with the hospitalist on call if the hospitalist that took care of you is not available. Once you are discharged, your primary care physician will handle any further medical issues. Please note that NO REFILLS for any discharge medications will be authorized once you are discharged, as it is imperative that you return to your primary care physician (or establish a relationship with a primary care physician if you do not have one) for your aftercare needs so that they can reassess your need for medications and monitor your lab values.     Today   Patient is having some nausea this morning. Had a small bowel movement this morning. No other complaints presently. Husband at bedside.  VITAL SIGNS:  Blood pressure 114/55, pulse 71, temperature 98 F (36.7 C), temperature source Oral, resp. rate 16, height 5\' 4"  (1.626 m), weight 58.968 kg (130 lb), SpO2 96 %.  I/O:    Intake/Output Summary (Last 24 hours) at 03/29/15 0946 Last data filed at 03/29/15 0550  Gross per 24 hour  Intake    240 ml  Output    400 ml  Net   -160 ml    PHYSICAL EXAMINATION:  GENERAL:  70 y.o.-year-old patient lying in the bed with no acute distress.  EYES: Pupils equal, round, reactive to  light and accommodation. No scleral icterus. Extraocular muscles intact.  HEENT: Head atraumatic, normocephalic. Oropharynx and nasopharynx clear.  NECK:  Supple, no jugular venous distention. No thyroid enlargement, no tenderness.  LUNGS: Normal breath sounds bilaterally, no wheezing, rales,rhonchi. No use of accessory muscles of respiration.  CARDIOVASCULAR: S1, S2 normal. No murmurs, rubs, or gallops.  ABDOMEN: Soft, non-tender, non-distended. Bowel sounds present. No organomegaly or mass.  EXTREMITIES: No pedal edema, cyanosis, or clubbing. Right Hip dressing in place.   NEUROLOGIC: Cranial nerves II through XII are intact. No focal motor or sensory defecits b/l.  PSYCHIATRIC: The patient is alert and oriented x 3. Good affect.  SKIN: No obvious rash, lesion, or ulcer.   DATA REVIEW:   CBC  Recent Labs Lab 03/29/15 0338  WBC 9.5  HGB 8.7*  HCT 26.3*  PLT 116*    Chemistries   Recent Labs Lab 03/26/15 0313  03/28/15 0338  NA 135  < > 136  K 3.8  < >  4.4  CL 103  < > 108  CO2 24  < > 25  GLUCOSE 217*  < > 162*  BUN 23*  < > 17  CREATININE 0.81  < > 0.71  CALCIUM 8.9  < > 8.2*  AST 27  --   --   ALT 20  --   --   ALKPHOS 64  --   --   BILITOT 0.5  --   --   < > = values in this interval not displayed.  Cardiac Enzymes  Recent Labs Lab 03/26/15 0313  TROPONINI <0.03    Microbiology Results  No results found for this or any previous visit.  RADIOLOGY:  No results found.    Management plans discussed with the patient, family and they are in agreement.  CODE STATUS:     Code Status Orders        Start     Ordered   03/26/15 1447  Full code   Continuous     03/26/15 1446    Code Status History    Date Active Date Inactive Code Status Order ID Comments User Context   03/26/2015  5:11 AM 03/26/2015  2:46 PM Full Code [id number]  Earnestine Leys, MD ED      TOTAL TIME TAKING CARE OF THIS PATIENT: 40 minutes.    Henreitta Leber M.D on 03/29/2015 at  9:46 AM  Between 7am to 6pm - Pager - [phone]  After 6pm go to [email or web address] - password [email or web address]  [facility]  Office  [phone]  CC: Primary care physician; Leonel Ramsay, MD

## 2015-03-29 NOTE — Progress Notes (Signed)
Patient will be d/c to [facility]. [facility] is aware of discharge. Staff confirmed discharge orders and FL2 was received and agreed to weekend admission. Nurse was notified that packet was ready and placed next to patients chart. Nurse was given number for report [phone]. Room number is 326.  Patient is alert and oriented, husband is at bedside, both has agreed to discharge to [facility]. Patient will transport to facility by EMS.    Anitra Lauth, BSW, MSW, [facility] [phone]

## 2015-03-29 NOTE — Progress Notes (Addendum)
Report called to [place] at [facility]. Husband bedside. IV removed.

## 2015-03-29 NOTE — Progress Notes (Signed)
Physical Therapy Treatment Patient Details Name: Cassandra Joyce MRN: [id number] DOB: 21-Feb-1945 Today's Date: 03/29/2015    History of Present Illness 70 yo F presented to ED after falling down the stairs at her home after she got out ot bed to get food because she thought her blood sugar was low. She had imediate excrutiating pain in R hip. She was found to have a R hip fx and is s/p hemiarthroplasty. She has posterior hip precautions and is PWB on R LE. PMH includes DM, and HTN.     PT Comments    Pt is able to perform all requested acts, but generally is pain limited (hesitant to take a lot of weight through the R LE) and though she is able to take a longer walk she was slow, hesitant and inconsistent with her cadence. Pt shows good effort but needs a lot of cuing and some assist with each activity.  Follow Up Recommendations  SNF     Equipment Recommendations       Recommendations for Other Services       Precautions / Restrictions Precautions Precautions: Posterior Hip Restrictions RLE Weight Bearing: Partial weight bearing    Mobility  Bed Mobility Overal bed mobility: Needs Assistance Bed Mobility: Supine to Sit;Sit to Supine     Supine to sit: Min assist Sit to supine: Min assist   General bed mobility comments: Pt shows good effort but does have need for light assist with R LE and also to control trunk during transitions  Transfers Overall transfer level: Needs assistance Equipment used: Rolling walker (2 wheeled) Transfers: Sit to/from Stand Sit to Stand: Min assist         General transfer comment: Pt needs cues and close assist to insure safety and get her to fully upright.  Ambulation/Gait Ambulation/Gait assistance: Min guard Ambulation Distance (Feet): 185 Feet Assistive device: Rolling walker (2 wheeled)       General Gait Details: Pt with guarded, inconsistent cadence, but did not have any LOBs or signficant safety concerns.  Pt needs  reminders with each turn to the R to watch IR precautions.  Pt able to take a few step-through strides with heavy cuing but typically reverts back to step-to most of the time.    Stairs            Wheelchair Mobility    Modified Rankin (Stroke Patients Only)       Balance                                    Cognition Arousal/Alertness: Awake/alert Behavior During Therapy: WFL for tasks assessed/performed Overall Cognitive Status: Within Functional Limits for tasks assessed                      Exercises Total Joint Exercises Ankle Circles/Pumps: AROM;10 reps Quad Sets: Strengthening;10 reps Gluteal Sets: Strengthening;10 reps Short Arc Quad: AROM;10 reps Heel Slides: Strengthening;10 reps;AAROM Hip ABduction/ADduction: AAROM;AROM;10 reps Straight Leg Raises: AAROM;5 reps    General Comments        Pertinent Vitals/Pain Pain Score: 6     Home Living                      Prior Function            PT Goals (current goals can now be found in the care plan section) Progress towards  PT goals: Progressing toward goals    Frequency  BID    PT Plan Current plan remains appropriate    Co-evaluation             End of Session Equipment Utilized During Treatment: Gait belt Activity Tolerance: Patient limited by pain Patient left: with call bell/phone within reach;with family/visitor present;with bed alarm set     Time: 1100-1129 PT Time Calculation (min) (ACUTE ONLY): 29 min  Charges:  $Gait Training: 8-22 mins $Therapeutic Exercise: 8-22 mins                    G Codes:     Wayne Both, PT, DPT [phone]  Kreg Shropshire 03/29/2015, 12:38 PM

## 2015-03-29 NOTE — Clinical Social Work Placement (Signed)
   CLINICAL SOCIAL WORK PLACEMENT  NOTE  Date:  03/29/2015  Patient Details  Name: Cassandra Joyce MRN: [id number] Date of Birth: [birth year]  Clinical Social Work is seeking post-discharge placement for this patient at the [place] level of care (*CSW will initial, date and re-position this form in  chart as items are completed):  Yes   Patient/family provided with [facility] Work Department's list of facilities offering this level of care within the geographic area requested by the patient (or if unable, by the patient's family).  Yes   Patient/family informed of their freedom to choose among providers that offer the needed level of care, that participate in Medicare, Medicaid or managed care program needed by the patient, have an available bed and are willing to accept the patient.  Yes   Patient/family informed of [facility]'s ownership interest in [facility] and [facility], as well as of the fact that they are under no obligation to receive care at these facilities.  PASRR submitted to EDS on 03/26/15     PASRR number received on 03/26/15     Existing PASRR number confirmed on       FL2 transmitted to all facilities in geographic area requested by pt/family on 03/27/15     FL2 transmitted to all facilities within larger geographic area on       Patient informed that his/her managed care company has contracts with or will negotiate with certain facilities, including the following:        Yes   Patient/family informed of bed offers received.  Patient chooses bed at  [facility] )     Physician recommends and patient chooses bed at      Patient to be transferred to  [facility]) on 03/29/15.  Patient to be transferred to facility by  (EMS)     Patient family notified on 03/29/15 of transfer.  Name of family member notified:   Cassandra Joyce (Spouse))     PHYSICIAN       Additional Comment:     _______________________________________________ Micah Flesher, LCSW 03/29/2015, 11:13 AM

## 2015-03-30 LAB — SURGICAL PATHOLOGY

## 2015-03-31 DIAGNOSIS — E11319 Type 2 diabetes mellitus with unspecified diabetic retinopathy without macular edema: Secondary | ICD-10-CM | POA: Diagnosis not present

## 2015-03-31 DIAGNOSIS — S7291XA Unspecified fracture of right femur, initial encounter for closed fracture: Secondary | ICD-10-CM | POA: Diagnosis not present

## 2015-03-31 DIAGNOSIS — I10 Essential (primary) hypertension: Secondary | ICD-10-CM

## 2015-03-31 DIAGNOSIS — K2270 Barrett's esophagus without dysplasia: Secondary | ICD-10-CM | POA: Diagnosis not present

## 2015-04-06 DIAGNOSIS — R3915 Urgency of urination: Secondary | ICD-10-CM | POA: Diagnosis not present

## 2015-04-15 ENCOUNTER — Encounter: Payer: Self-pay | Admitting: Specialist

## 2015-06-26 ENCOUNTER — Other Ambulatory Visit: Payer: Self-pay | Admitting: Infectious Diseases

## 2015-06-26 DIAGNOSIS — Z1231 Encounter for screening mammogram for malignant neoplasm of breast: Secondary | ICD-10-CM

## 2015-07-17 ENCOUNTER — Other Ambulatory Visit: Payer: Self-pay | Admitting: Infectious Diseases

## 2015-07-17 ENCOUNTER — Ambulatory Visit
Admission: RE | Admit: 2015-07-17 | Discharge: 2015-07-17 | Disposition: A | Discharge status: Home / Self Care | Payer: Medicare Other | Source: Ambulatory Visit | Attending: Infectious Diseases | Admitting: Infectious Diseases

## 2015-07-17 DIAGNOSIS — Z1231 Encounter for screening mammogram for malignant neoplasm of breast: Secondary | ICD-10-CM | POA: Diagnosis not present

## 2015-08-03 ENCOUNTER — Encounter (HOSPITAL_COMMUNITY): Payer: Self-pay

## 2015-08-03 ENCOUNTER — Encounter (HOSPITAL_COMMUNITY)
Admission: RE | Admit: 2015-08-03 | Discharge: 2015-08-03 | Disposition: A | Discharge status: Home / Self Care | Payer: Medicare Other | Source: Ambulatory Visit | Attending: Orthopedic Surgery | Admitting: Orthopedic Surgery

## 2015-08-03 DIAGNOSIS — K219 Gastro-esophageal reflux disease without esophagitis: Secondary | ICD-10-CM | POA: Diagnosis not present

## 2015-08-03 DIAGNOSIS — Z01812 Encounter for preprocedural laboratory examination: Secondary | ICD-10-CM | POA: Diagnosis present

## 2015-08-03 DIAGNOSIS — E785 Hyperlipidemia, unspecified: Secondary | ICD-10-CM | POA: Diagnosis not present

## 2015-08-03 DIAGNOSIS — T84090A Other mechanical complication of internal right hip prosthesis, initial encounter: Secondary | ICD-10-CM | POA: Insufficient documentation

## 2015-08-03 DIAGNOSIS — Z0183 Encounter for blood typing: Secondary | ICD-10-CM | POA: Insufficient documentation

## 2015-08-03 DIAGNOSIS — Z882 Allergy status to sulfonamides status: Secondary | ICD-10-CM | POA: Insufficient documentation

## 2015-08-03 DIAGNOSIS — Z880 Allergy status to penicillin: Secondary | ICD-10-CM | POA: Insufficient documentation

## 2015-08-03 DIAGNOSIS — E109 Type 1 diabetes mellitus without complications: Secondary | ICD-10-CM | POA: Diagnosis not present

## 2015-08-03 DIAGNOSIS — Z9641 Presence of insulin pump (external) (internal): Secondary | ICD-10-CM | POA: Diagnosis not present

## 2015-08-03 DIAGNOSIS — Y838 Other surgical procedures as the cause of abnormal reaction of the patient, or of later complication, without mention of misadventure at the time of the procedure: Secondary | ICD-10-CM | POA: Insufficient documentation

## 2015-08-03 DIAGNOSIS — I10 Essential (primary) hypertension: Secondary | ICD-10-CM | POA: Insufficient documentation

## 2015-08-03 HISTORY — DX: Reserved for inherently not codable concepts without codable children: IMO0001

## 2015-08-03 HISTORY — DX: Gastro-esophageal reflux disease without esophagitis: K21.9

## 2015-08-03 HISTORY — DX: Other seasonal allergic rhinitis: J30.2

## 2015-08-03 HISTORY — DX: Essential (primary) hypertension: I10

## 2015-08-03 HISTORY — DX: Unspecified hearing loss, unspecified ear: H91.90

## 2015-08-03 LAB — BASIC METABOLIC PANEL
Anion gap: 4 — ABNORMAL LOW (ref 5–15)
BUN: 20 mg/dL (ref 6–20)
CALCIUM: 9.5 mg/dL (ref 8.9–10.3)
CO2: 29 mmol/L (ref 22–32)
CREATININE: 0.78 mg/dL (ref 0.44–1.00)
Chloride: 106 mmol/L (ref 101–111)
Glucose, Bld: 65 mg/dL (ref 65–99)
Potassium: 4.3 mmol/L (ref 3.5–5.1)
SODIUM: 139 mmol/L (ref 135–145)

## 2015-08-03 LAB — CBC
HCT: 38.8 % (ref 36.0–46.0)
Hemoglobin: 12.5 g/dL (ref 12.0–15.0)
MCH: 27.8 pg (ref 26.0–34.0)
MCHC: 32.2 g/dL (ref 30.0–36.0)
MCV: 86.2 fL (ref 78.0–100.0)
PLATELETS: 193 10*3/uL (ref 150–400)
RBC: 4.5 MIL/uL (ref 3.87–5.11)
RDW: 13.6 % (ref 11.5–15.5)
WBC: 6.8 10*3/uL (ref 4.0–10.5)

## 2015-08-03 LAB — SURGICAL PCR SCREEN
MRSA, PCR: NEGATIVE
Staphylococcus aureus: NEGATIVE

## 2015-08-03 LAB — ABO/RH: ABO/RH(D): O POS

## 2015-08-03 NOTE — Patient Instructions (Addendum)
Cassandra Joyce  08/03/2015   Your procedure is scheduled on: 08-10-15  Report to [facility] Main  Entrance take [facility]  elevators to 3rd floor to  [place] at  615 AM.  Call this number if you have problems the morning of surgery [phone]   Remember: ONLY 1 PERSON MAY GO WITH YOU TO SHORT STAY TO GET  READY MORNING OF Cassandra Joyce.  Do not eat food or drink liquids :After Midnight.     Take these medicines the morning of surgery with A SIP OF WATER: Omeprazole. Tylenol-if need. Insulin Pump at usual basal rate.(Bring own supplies).  DO NOT TAKE ANY DIABETIC MEDICATIONS DAY OF YOUR SURGERY                               You may not have any metal on your body including hair pins and              piercings  Do not wear jewelry, make-up, lotions, powders or perfumes, deodorant             Do not wear nail polish.  Do not shave  48 hours prior to surgery.              Men may shave face and neck.   Do not bring valuables to the hospital. [place].  Contacts, dentures or bridgework may not be worn into surgery.  Leave suitcase in the car. After surgery it may be brought to your room.     Patients discharged the day of surgery will not be allowed to drive home.  Name and phone number of your driver: Cassandra Joyce [phone] cell  Special Instructions: N/A              Please read over the following fact sheets you were given: _____________________________________________________________________             [facility] - Preparing for Surgery Before surgery, you can play an important role.  Because skin is not sterile, your skin needs to be as free of germs as possible.  You can reduce the number of germs on your skin by washing with CHG (chlorahexidine gluconate) soap before surgery.  CHG is an antiseptic cleaner which kills germs and bonds with the skin to continue killing germs even after  washing. Please DO NOT use if you have an allergy to CHG or antibacterial soaps.  If your skin becomes reddened/irritated stop using the CHG and inform your nurse when you arrive at Short Stay. Do not shave (including legs and underarms) for at least 48 hours prior to the first CHG shower.  You may shave your face/neck. Please follow these instructions carefully:  1.  Shower with CHG Soap the night before surgery and the  morning of Surgery.  2.  If you choose to wash your hair, wash your hair first as usual with your  normal  shampoo.  3.  After you shampoo, rinse your hair and body thoroughly to remove the  shampoo.                           4.  Use CHG as you would any other liquid  soap.  You can apply chg directly  to the skin and wash                       Gently with a scrungie or clean washcloth.  5.  Apply the CHG Soap to your body ONLY FROM THE NECK DOWN.   Do not use on face/ open                           Wound or open sores. Avoid contact with eyes, ears mouth and genitals (private parts).                       Wash face,  Genitals (private parts) with your normal soap.             6.  Wash thoroughly, paying special attention to the area where your surgery  will be performed.  7.  Thoroughly rinse your body with warm water from the neck down.  8.  DO NOT shower/wash with your normal soap after using and rinsing off  the CHG Soap.                9.  Pat yourself dry with a clean towel.            10.  Wear clean pajamas.            11.  Place clean sheets on your bed the night of your first shower and do not  sleep with pets. Day of Surgery : Do not apply any lotions/deodorants the morning of surgery.  Please wear clean clothes to the hospital/surgery center.  FAILURE TO FOLLOW THESE INSTRUCTIONS MAY RESULT IN THE CANCELLATION OF YOUR SURGERY PATIENT SIGNATURE_________________________________  NURSE  SIGNATURE__________________________________  ________________________________________________________________________   [name]  An incentive spirometer is a tool that can help keep your lungs clear and active. This tool measures how well you are filling your lungs with each breath. Taking long deep breaths may help reverse or decrease the chance of developing breathing (pulmonary) problems (especially infection) following:  A long period of time when you are unable to move or be active. BEFORE THE PROCEDURE   If the spirometer includes an indicator to show your best effort, your nurse or respiratory therapist will set it to a desired goal.  If possible, sit up straight or lean slightly forward. Try not to slouch.  Hold the incentive spirometer in an upright position. INSTRUCTIONS FOR USE  1. Sit on the edge of your bed if possible, or sit up as far as you can in bed or on a chair. 2. Hold the incentive spirometer in an upright position. 3. Breathe out normally. 4. Place the mouthpiece in your mouth and seal your lips tightly around it. 5. Breathe in slowly and as deeply as possible, raising the piston or the ball toward the top of the column. 6. Hold your breath for 3-5 seconds or for as long as possible. Allow the piston or ball to fall to the bottom of the column. 7. Remove the mouthpiece from your mouth and breathe out normally. 8. Rest for a few seconds and repeat Steps 1 through 7 at least 10 times every 1-2 hours when you are awake. Take your time and take a few normal breaths between deep breaths. 9. The spirometer may include an indicator to show your best effort. Use the indicator as a goal to  work toward during each repetition. 10. After each set of 10 deep breaths, practice coughing to be sure your lungs are clear. If you have an incision (the cut made at the time of surgery), support your incision when coughing by placing a pillow or rolled up towels firmly  against it. Once you are able to get out of bed, walk around indoors and cough well. You may stop using the incentive spirometer when instructed by your caregiver.  RISKS AND COMPLICATIONS  Take your time so you do not get dizzy or light-headed.  If you are in pain, you may need to take or ask for pain medication before doing incentive spirometry. It is harder to take a deep breath if you are having pain. AFTER USE  Rest and breathe slowly and easily.  It can be helpful to keep track of a log of your progress. Your caregiver can provide you with a simple table to help with this. If you are using the spirometer at home, follow these instructions: [place] IF:   You are having difficultly using the spirometer.  You have trouble using the spirometer as often as instructed.  Your pain medication is not giving enough relief while using the spirometer.  You develop fever of 100.5 F (38.1 C) or higher. SEEK IMMEDIATE MEDICAL CARE IF:   You cough up bloody sputum that had not been present before.  You develop fever of 102 F (38.9 C) or greater.  You develop worsening pain at or near the incision site. MAKE SURE YOU:   Understand these instructions.  Will watch your condition.  Will get help right away if you are not doing well or get worse. Document Released: 05/02/2006 Document Revised: 03/14/2011 Document Reviewed: 07/03/2006 ExitCare Patient Information 2014 ExitCare, [place].   ________________________________________________________________________  WHAT IS A BLOOD TRANSFUSION? Blood Transfusion Information  A transfusion is the replacement of blood or some of its parts. Blood is made up of multiple cells which provide different functions.  Red blood cells carry oxygen and are used for blood loss replacement.  White blood cells fight against infection.  Platelets control bleeding.  Plasma helps clot blood.  Other blood products are available for  specialized needs, such as hemophilia or other clotting disorders. BEFORE THE TRANSFUSION  Who gives blood for transfusions?   Healthy volunteers who are fully evaluated to make sure their blood is safe. This is blood bank blood. Transfusion therapy is the safest it has ever been in the practice of medicine. Before blood is taken from a donor, a complete history is taken to make sure that person has no history of diseases nor engages in risky social behavior (examples are intravenous drug use or sexual activity with multiple partners). The donor's travel history is screened to minimize risk of transmitting infections, such as malaria. The donated blood is tested for signs of infectious diseases, such as HIV and hepatitis. The blood is then tested to be sure it is compatible with you in order to minimize the chance of a transfusion reaction. If you or a relative donates blood, this is often done in anticipation of surgery and is not appropriate for emergency situations. It takes many days to process the donated blood. RISKS AND COMPLICATIONS Although transfusion therapy is very safe and saves many lives, the main dangers of transfusion include:   Getting an infectious disease.  Developing a transfusion reaction. This is an allergic reaction to something in the blood you were given. Every precaution is  taken to prevent this. The decision to have a blood transfusion has been considered carefully by your caregiver before blood is given. Blood is not given unless the benefits outweigh the risks. AFTER THE TRANSFUSION  Right after receiving a blood transfusion, you will usually feel much better and more energetic. This is especially true if your red blood cells have gotten low (anemic). The transfusion raises the level of the red blood cells which carry oxygen, and this usually causes an energy increase.  The nurse administering the transfusion will monitor you carefully for complications. HOME CARE  INSTRUCTIONS  No special instructions are needed after a transfusion. You may find your energy is better. Speak with your caregiver about any limitations on activity for underlying diseases you may have. SEEK MEDICAL CARE IF:   Your condition is not improving after your transfusion.  You develop redness or irritation at the intravenous (IV) site. SEEK IMMEDIATE MEDICAL CARE IF:  Any of the following symptoms occur over the next 12 hours:  Shaking chills.  You have a temperature by mouth above 102 F (38.9 C), not controlled by medicine.  Chest, back, or muscle pain.  People around you feel you are not acting correctly or are confused.  Shortness of breath or difficulty breathing.  Dizziness and fainting.  You get a rash or develop hives.  You have a decrease in urine output.  Your urine turns a dark color or changes to pink, red, or brown. Any of the following symptoms occur over the next 10 days:  You have a temperature by mouth above 102 F (38.9 C), not controlled by medicine.  Shortness of breath.  Weakness after normal activity.  The white part of the eye turns yellow (jaundice).  You have a decrease in the amount of urine or are urinating less often.  Your urine turns a dark color or changes to pink, red, or brown. Document Released: 12/18/1999 Document Revised: 03/14/2011 Document Reviewed: 08/06/2007 [facility] Patient Information 2014 [place], [place].  _______________________________________________________________________

## 2015-08-03 NOTE — Pre-Procedure Instructions (Signed)
Clearance note(Dr. Ola Spurr) 06-30-15 with chart. EKG 06-26-15 Epic.

## 2015-08-04 NOTE — Pre-Procedure Instructions (Signed)
08-04-15 0830 noted Glucose on 08-03-15 "65", pt provided snack before leaving PAT appointment, after stating she "thinks glucose level is low"- says going to get something to eat right away. Pt. States "feels okay" after snack provided.

## 2015-08-05 LAB — HEMOGLOBIN A1C
Hgb A1c MFr Bld: 7.2 % — ABNORMAL HIGH (ref 4.8–5.6)
MEAN PLASMA GLUCOSE: 160 mg/dL

## 2015-08-09 NOTE — H&P (Signed)
TOTAL HIP REVISION ADMISSION H&P  Patient is admitted for right revision total hip arthroplasty.  Subjective:  Chief Complaint:    Right hip pain s/p hemiarthroplasty  HPI: Cassandra Joyce, 70 y.o. 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 The indications for the revision total hip arthroplasty are bearing surface wear leading to  symptomatic synovitis.  Onset of symptoms was abrupt starting 03/26/2015 with rapidlly worsening course since that time.  Prior procedures on the right hip include hemi-arthroplasty.  Patient currently rates pain in the right hip at 5 out of 10 with activity.  There is worsening of pain with activity and weight bearing, trendelenberg gait, pain that interfers with activities of daily living, pain with passive range of motion and complaints of leg length discrepency. Patient has evidence of previous right hip hemiarthroplasty by imaging studies.  This condition presents safety issues increasing the risk of falls.   There is no current active infection.   Risks, benefits and expectations were discussed with the patient.  Risks including but not limited to the risk of anesthesia, blood clots, nerve damage, blood vessel damage, failure of the prosthesis, infection and up to and including death.  Patient understand the risks, benefits and expectations and wishes to proceed with surgery.   PCP: Leonel Ramsay, MD  D/C Plans:      Home  Post-op Meds:       No Rx given  Tranexamic Acid:      To be given - IV   Decadron:      Is to be given  FYI:     ASA  Norco    Patient Active Problem List   Diagnosis Date Noted  . Hip fracture ([place]) 03/26/2015   Past Medical History:  Diagnosis Date  . Diabetes mellitus type 1 (HCC)    Medtronic  Medimed Insulin pump  . Essential (primary) hypertension   . GERD (gastroesophageal reflux disease)    Barret's esphagus  . Hyperlipidemia   . Hypertension   . Impaired hearing    left ear hearing loss, tinnitis  . Osteoarthritis    right hip, back  . Seasonal allergies     Past Surgical History:  Procedure Laterality Date  . CATARACT EXTRACTION     1 eye, other eye not done yet  . COLONOSCOPY    . ESOPHAGOGASTRODUODENOSCOPY N/A 07/11/2014   Procedure: ESOPHAGOGASTRODUODENOSCOPY (EGD);  Surgeon: Manya Silvas, MD;  Location: [facility] ENDOSCOPY;  Service: Endoscopy;  Laterality: N/A;  . HERNIA REPAIR Left    inguinal '73  . HIP ARTHROPLASTY Right 03/26/2015   Procedure: ARTHROPLASTY BIPOLAR HIP (HEMIARTHROPLASTY);  Surgeon: Earnestine Leys, MD;  Location: [facility] ORS;  Service: Orthopedics;  Laterality: Right;  . TONSILLECTOMY    . TUBAL LIGATION      No prescriptions prior to admission.   Allergies  Allergen Reactions  . Augmentin [Amoxicillin-Pot Clavulanate]     Need to clarify reaction  . Mercury Swelling  . Penicillin G Nausea Only    Need to clarify reaction Has patient had a PCN reaction causing immediate rash, facial/tongue/throat swelling, SOB or lightheadedness with hypotension: No Has patient had a PCN reaction causing severe rash involving mucus membranes or skin necrosis: No Has patient had a PCN reaction that required hospitalization No Has patient had a  PCN reaction occurring within the last 10 years: No If all of the above answers are "NO", then may proceed with Cephalosporin use.   . Sulfa Antibiotics     Need to clarify reaction    Social History  Substance Use Topics  . Smoking status: Never Smoker  . Smokeless tobacco: Never Used  . Alcohol use Yes     Comment: rare    Family History  Problem Relation Age of Onset  . Diabetes Mellitus II Mother   . Diabetes Mellitus II Sister       Review of Systems  Constitutional: Negative.   HENT: Positive for  hearing loss and tinnitus.   Eyes: Negative.   Respiratory: Negative.   Cardiovascular: Negative.   Gastrointestinal: Positive for heartburn.  Genitourinary: Negative.   Musculoskeletal: Positive for joint pain.  Skin: Negative.   Neurological: Negative.   Endo/Heme/Allergies: Positive for environmental allergies.  Psychiatric/Behavioral: Negative.     Objective:  Physical Exam  Constitutional: She is oriented to person, place, and time. She appears well-developed.  HENT:  Head: Normocephalic.  Eyes: Pupils are equal, round, and reactive to light.  Neck: Neck supple. No JVD present. No tracheal deviation present. No thyromegaly present.  Cardiovascular: Normal rate, regular rhythm, normal heart sounds and intact distal pulses.   Respiratory: Effort normal and breath sounds normal. No respiratory distress. She has no wheezes.  GI: Soft. There is no tenderness. There is no guarding.  Musculoskeletal:       Right hip: She exhibits decreased range of motion, decreased strength, tenderness, bony tenderness and laceration (healed previous incision).  Lymphadenopathy:    She has no cervical adenopathy.  Neurological: She is alert and oriented to person, place, and time. A sensory deficit (tingling right LE) is present.  Skin: Skin is warm and dry.  Psychiatric: She has a normal mood and affect.      Labs:   Estimated body mass index is 22.01 kg/m as calculated from the following:   Height as of 08/03/15: 5\' 5"  (1.651 m).   Weight as of 08/03/15: 60 kg (132 lb 4 oz).  Imaging Review:  Plain radiographs demonstrate previous hemiarthroplasty of the right hip(s). The bone quality appears to be good for age and reported activity level.  Assessment/Plan:  Right hip with failed previous hemiarthroplasty.  The patient history, physical examination, clinical judgement of the provider and imaging studies are consistent with failure of the right hip(s), previous hip hemiarthroplasty.  Revision total hip arthroplasty is deemed medically necessary. The treatment options including medical management, injection therapy, arthroscopy and arthroplasty were discussed at length. The risks and benefits of total hip arthroplasty were presented and reviewed. The risks due to aseptic loosening, infection, stiffness, dislocation/subluxation,  thromboembolic complications and other imponderables were discussed.  The patient acknowledged the explanation, agreed to proceed with the plan and consent was signed. Patient is being admitted for inpatient treatment for surgery, pain control, PT, OT, prophylactic antibiotics, VTE prophylaxis, progressive ambulation and ADL's and discharge planning. The patient is planning to be discharged home.     West Pugh Linnet Bottari   PA-C  08/09/2015, 7:58 PM

## 2015-08-09 NOTE — Anesthesia Preprocedure Evaluation (Addendum)
Anesthesia Evaluation  Patient identified by MRN, date of birth, ID band Patient awake    Reviewed: Allergy & Precautions, NPO status , Patient's Chart, lab work & pertinent test results  Airway Mallampati: II  TM Distance: >3 FB Neck ROM: Full    Dental  (+) Teeth Intact, Dental Advisory Given   Pulmonary neg pulmonary ROS,    breath sounds clear to auscultation       Cardiovascular hypertension, Pt. on medications  Rhythm:Regular Rate:Normal     Neuro/Psych negative neurological ROS  negative psych ROS   GI/Hepatic Neg liver ROS, GERD  Medicated,  Endo/Other  diabetes, Insulin Dependent  Renal/GU negative Renal ROS  negative genitourinary   Musculoskeletal  (+) Arthritis ,   Abdominal   Peds negative pediatric ROS (+)  Hematology negative hematology ROS (+)   Anesthesia Other Findings   Reproductive/Obstetrics negative OB ROS                             Lab Results  Component Value Date   WBC 6.8 08/03/2015   HGB 12.5 08/03/2015   HCT 38.8 08/03/2015   MCV 86.2 08/03/2015   PLT 193 08/03/2015   Lab Results  Component Value Date   INR 1.21 03/26/2015    03/2015 EKG: NSR  Anesthesia Physical Anesthesia Plan  ASA: II  Anesthesia Plan: Spinal   Post-op Pain Management:    Induction: Intravenous  Airway Management Planned: Natural Airway  Additional Equipment:   Intra-op Plan:   Post-operative Plan:   Informed Consent: I have reviewed the patients History and Physical, chart, labs and discussed the procedure including the risks, benefits and alternatives for the proposed anesthesia with the patient or authorized representative who has indicated his/her understanding and acceptance.     Plan Discussed with: CRNA  Anesthesia Plan Comments:         Anesthesia Quick Evaluation

## 2015-08-10 ENCOUNTER — Inpatient Hospital Stay (HOSPITAL_COMMUNITY): Payer: Medicare Other | Admitting: Anesthesiology

## 2015-08-10 ENCOUNTER — Encounter (HOSPITAL_COMMUNITY): Payer: Self-pay | Admitting: *Deleted

## 2015-08-10 ENCOUNTER — Inpatient Hospital Stay (HOSPITAL_COMMUNITY): Payer: Medicare Other

## 2015-08-10 ENCOUNTER — Inpatient Hospital Stay (HOSPITAL_COMMUNITY)
Admission: RE | Admit: 2015-08-10 | Discharge: 2015-08-14 | DRG: 467 | Disposition: A | Discharge status: Home Health | Payer: Medicare Other | Source: Ambulatory Visit | Attending: Orthopedic Surgery | Admitting: Orthopedic Surgery

## 2015-08-10 ENCOUNTER — Encounter (HOSPITAL_COMMUNITY)
Admission: RE | Disposition: A | Discharge status: Home Health | Payer: Self-pay | Source: Ambulatory Visit | Attending: Orthopedic Surgery

## 2015-08-10 DIAGNOSIS — H9312 Tinnitus, left ear: Secondary | ICD-10-CM | POA: Diagnosis present

## 2015-08-10 DIAGNOSIS — D62 Acute posthemorrhagic anemia: Secondary | ICD-10-CM | POA: Diagnosis not present

## 2015-08-10 DIAGNOSIS — Z882 Allergy status to sulfonamides status: Secondary | ICD-10-CM

## 2015-08-10 DIAGNOSIS — R12 Heartburn: Secondary | ICD-10-CM | POA: Diagnosis present

## 2015-08-10 DIAGNOSIS — K219 Gastro-esophageal reflux disease without esophagitis: Secondary | ICD-10-CM | POA: Diagnosis present

## 2015-08-10 DIAGNOSIS — M2170 Unequal limb length (acquired), unspecified site: Secondary | ICD-10-CM | POA: Diagnosis present

## 2015-08-10 DIAGNOSIS — Z6822 Body mass index (BMI) 22.0-22.9, adult: Secondary | ICD-10-CM | POA: Diagnosis not present

## 2015-08-10 DIAGNOSIS — Z888 Allergy status to other drugs, medicaments and biological substances status: Secondary | ICD-10-CM

## 2015-08-10 DIAGNOSIS — Z833 Family history of diabetes mellitus: Secondary | ICD-10-CM

## 2015-08-10 DIAGNOSIS — H9192 Unspecified hearing loss, left ear: Secondary | ICD-10-CM | POA: Diagnosis present

## 2015-08-10 DIAGNOSIS — E109 Type 1 diabetes mellitus without complications: Secondary | ICD-10-CM | POA: Diagnosis present

## 2015-08-10 DIAGNOSIS — E785 Hyperlipidemia, unspecified: Secondary | ICD-10-CM | POA: Diagnosis present

## 2015-08-10 DIAGNOSIS — I10 Essential (primary) hypertension: Secondary | ICD-10-CM | POA: Diagnosis present

## 2015-08-10 DIAGNOSIS — Z880 Allergy status to penicillin: Secondary | ICD-10-CM

## 2015-08-10 DIAGNOSIS — J302 Other seasonal allergic rhinitis: Secondary | ICD-10-CM | POA: Diagnosis present

## 2015-08-10 DIAGNOSIS — Z794 Long term (current) use of insulin: Secondary | ICD-10-CM

## 2015-08-10 DIAGNOSIS — Z96649 Presence of unspecified artificial hip joint: Secondary | ICD-10-CM

## 2015-08-10 DIAGNOSIS — Z9641 Presence of insulin pump (external) (internal): Secondary | ICD-10-CM | POA: Diagnosis present

## 2015-08-10 DIAGNOSIS — Y798 Miscellaneous orthopedic devices associated with adverse incidents, not elsewhere classified: Secondary | ICD-10-CM | POA: Diagnosis present

## 2015-08-10 DIAGNOSIS — T84090A Other mechanical complication of internal right hip prosthesis, initial encounter: Principal | ICD-10-CM | POA: Diagnosis present

## 2015-08-10 DIAGNOSIS — Y929 Unspecified place or not applicable: Secondary | ICD-10-CM

## 2015-08-10 DIAGNOSIS — M25551 Pain in right hip: Secondary | ICD-10-CM | POA: Diagnosis present

## 2015-08-10 HISTORY — PX: CONVERSION TO TOTAL HIP: SHX5784

## 2015-08-10 LAB — GLUCOSE, CAPILLARY
GLUCOSE-CAPILLARY: 149 mg/dL — AB (ref 65–99)
GLUCOSE-CAPILLARY: 276 mg/dL — AB (ref 65–99)
Glucose-Capillary: 158 mg/dL — ABNORMAL HIGH (ref 65–99)
Glucose-Capillary: 203 mg/dL — ABNORMAL HIGH (ref 65–99)
Glucose-Capillary: 256 mg/dL — ABNORMAL HIGH (ref 65–99)
Glucose-Capillary: 295 mg/dL — ABNORMAL HIGH (ref 65–99)

## 2015-08-10 SURGERY — CONVERSION, PREVIOUS HIP SURGERY, TO TOTAL HIP ARTHROPLASTY
Anesthesia: Spinal | Site: Hip | Laterality: Right

## 2015-08-10 MED ORDER — DOCUSATE SODIUM 100 MG PO CAPS
100.0000 mg | ORAL_CAPSULE | Freq: Two times a day (BID) | ORAL | Status: DC
  Administered 2015-08-10 – 2015-08-14 (×8): 100 mg via ORAL
  Filled 2015-08-10 (×8): qty 1

## 2015-08-10 MED ORDER — OMEPRAZOLE 20 MG PO CPDR
40.0000 mg | DELAYED_RELEASE_CAPSULE | Freq: Every day | ORAL | Status: DC
  Administered 2015-08-11 – 2015-08-13 (×3): 40 mg via ORAL
  Filled 2015-08-10 (×3): qty 2

## 2015-08-10 MED ORDER — MENTHOL 3 MG MT LOZG: 1.0000 | LOZENGE | OROMUCOSAL | Status: DC | PRN

## 2015-08-10 MED ORDER — FLUTICASONE PROPIONATE 50 MCG/ACT NA SUSP
2.0000 | Freq: Every day | NASAL | Status: DC
  Filled 2015-08-10: qty 16

## 2015-08-10 MED ORDER — CHLORHEXIDINE GLUCONATE 4 % EX LIQD: 60.0000 mL | Freq: Once | CUTANEOUS | Status: DC

## 2015-08-10 MED ORDER — SODIUM CHLORIDE 0.9 % IJ SOLN
INTRAMUSCULAR | Status: AC
Start: 2015-08-10 — End: 2015-08-10
  Filled 2015-08-10: qty 20

## 2015-08-10 MED ORDER — EPHEDRINE SULFATE 50 MG/ML IJ SOLN
INTRAMUSCULAR | Status: AC
  Filled 2015-08-10: qty 2

## 2015-08-10 MED ORDER — BUPIVACAINE HCL (PF) 0.5 % IJ SOLN
INTRAMUSCULAR | Status: DC | PRN
  Administered 2015-08-10: 2.5 mL

## 2015-08-10 MED ORDER — FENTANYL CITRATE (PF) 100 MCG/2ML IJ SOLN
25.0000 ug | INTRAMUSCULAR | Status: DC | PRN
  Administered 2015-08-10 (×3): 50 ug via INTRAVENOUS

## 2015-08-10 MED ORDER — PHENYLEPHRINE HCL 10 MG/ML IJ SOLN
INTRAMUSCULAR | Status: DC | PRN
  Administered 2015-08-10 (×5): 80 ug via INTRAVENOUS

## 2015-08-10 MED ORDER — FENTANYL CITRATE (PF) 100 MCG/2ML IJ SOLN
INTRAMUSCULAR | Status: DC | PRN
  Administered 2015-08-10: 100 ug via INTRAVENOUS

## 2015-08-10 MED ORDER — ALUM & MAG HYDROXIDE-SIMETH 200-200-20 MG/5ML PO SUSP: 30.0000 mL | ORAL | Status: DC | PRN

## 2015-08-10 MED ORDER — ACETAMINOPHEN 10 MG/ML IV SOLN
INTRAVENOUS | Status: AC
Start: 2015-08-10 — End: 2015-08-10
  Filled 2015-08-10: qty 100

## 2015-08-10 MED ORDER — ALBUMIN HUMAN 5 % IV SOLN
INTRAVENOUS | Status: DC | PRN
  Administered 2015-08-10: 11:00:00 via INTRAVENOUS

## 2015-08-10 MED ORDER — FENTANYL CITRATE (PF) 100 MCG/2ML IJ SOLN
INTRAMUSCULAR | Status: AC
  Filled 2015-08-10: qty 2

## 2015-08-10 MED ORDER — TRANEXAMIC ACID 1000 MG/10ML IV SOLN
1000.0000 mg | Freq: Once | INTRAVENOUS | Status: AC
  Administered 2015-08-10: 1000 mg via INTRAVENOUS
  Filled 2015-08-10: qty 10

## 2015-08-10 MED ORDER — ONDANSETRON HCL 4 MG/2ML IJ SOLN
4.0000 mg | Freq: Four times a day (QID) | INTRAMUSCULAR | Status: DC | PRN
  Administered 2015-08-10 – 2015-08-13 (×9): 4 mg via INTRAVENOUS
  Filled 2015-08-10 (×9): qty 2

## 2015-08-10 MED ORDER — DEXAMETHASONE SODIUM PHOSPHATE 10 MG/ML IJ SOLN
10.0000 mg | Freq: Once | INTRAMUSCULAR | Status: AC
  Administered 2015-08-11: 10 mg via INTRAVENOUS
  Filled 2015-08-10: qty 1

## 2015-08-10 MED ORDER — PROMETHAZINE HCL 25 MG/ML IJ SOLN: 6.2500 mg | INTRAMUSCULAR | Status: DC | PRN

## 2015-08-10 MED ORDER — LACTATED RINGERS IV SOLN: INTRAVENOUS | Status: DC

## 2015-08-10 MED ORDER — METHOCARBAMOL 500 MG PO TABS
500.0000 mg | ORAL_TABLET | Freq: Four times a day (QID) | ORAL | Status: DC | PRN
  Filled 2015-08-10: qty 1

## 2015-08-10 MED ORDER — ONDANSETRON HCL 4 MG PO TABS
4.0000 mg | ORAL_TABLET | Freq: Four times a day (QID) | ORAL | Status: DC | PRN
Start: 2015-08-10 — End: 2015-08-14
  Administered 2015-08-13: 4 mg via ORAL
  Filled 2015-08-10: qty 1

## 2015-08-10 MED ORDER — PROPOFOL 500 MG/50ML IV EMUL
INTRAVENOUS | Status: DC | PRN
  Administered 2015-08-10: 30 mg via INTRAVENOUS

## 2015-08-10 MED ORDER — CEFAZOLIN SODIUM-DEXTROSE 2-4 GM/100ML-% IV SOLN
2.0000 g | Freq: Four times a day (QID) | INTRAVENOUS | Status: AC
  Administered 2015-08-10 (×2): 2 g via INTRAVENOUS
  Filled 2015-08-10: qty 100

## 2015-08-10 MED ORDER — EPHEDRINE SULFATE 50 MG/ML IJ SOLN
INTRAMUSCULAR | Status: DC | PRN
Start: 2015-08-10 — End: 2015-08-10
  Administered 2015-08-10 (×2): 10 mg via INTRAVENOUS

## 2015-08-10 MED ORDER — STERILE WATER FOR IRRIGATION IR SOLN
Status: DC | PRN
  Administered 2015-08-10: 2000 mL

## 2015-08-10 MED ORDER — ASPIRIN 81 MG PO CHEW
81.0000 mg | CHEWABLE_TABLET | Freq: Two times a day (BID) | ORAL | Status: DC
  Administered 2015-08-10 – 2015-08-14 (×8): 81 mg via ORAL
  Filled 2015-08-10 (×8): qty 1

## 2015-08-10 MED ORDER — MAGNESIUM CITRATE PO SOLN: 1.0000 | Freq: Once | ORAL | Status: DC | PRN

## 2015-08-10 MED ORDER — PROPOFOL 500 MG/50ML IV EMUL
INTRAVENOUS | Status: DC | PRN
  Administered 2015-08-10: 50 ug/kg/min via INTRAVENOUS

## 2015-08-10 MED ORDER — ALBUMIN HUMAN 5 % IV SOLN
INTRAVENOUS | Status: AC
  Filled 2015-08-10: qty 250

## 2015-08-10 MED ORDER — PHENYLEPHRINE HCL 10 MG/ML IJ SOLN
INTRAVENOUS | Status: DC | PRN
  Administered 2015-08-10: 30 ug/min via INTRAVENOUS

## 2015-08-10 MED ORDER — LACTATED RINGERS IV SOLN
INTRAVENOUS | Status: DC
  Administered 2015-08-10 (×4): via INTRAVENOUS

## 2015-08-10 MED ORDER — PHENYLEPHRINE 40 MCG/ML (10ML) SYRINGE FOR IV PUSH (FOR BLOOD PRESSURE SUPPORT)
PREFILLED_SYRINGE | INTRAVENOUS | Status: AC
  Filled 2015-08-10: qty 20

## 2015-08-10 MED ORDER — METOCLOPRAMIDE HCL 5 MG/ML IJ SOLN
5.0000 mg | Freq: Three times a day (TID) | INTRAMUSCULAR | Status: DC | PRN
  Administered 2015-08-10 – 2015-08-13 (×4): 10 mg via INTRAVENOUS
  Filled 2015-08-10 (×4): qty 2

## 2015-08-10 MED ORDER — SIMVASTATIN 20 MG PO TABS
40.0000 mg | ORAL_TABLET | Freq: Every day | ORAL | Status: DC
  Administered 2015-08-11 – 2015-08-14 (×4): 40 mg via ORAL
  Filled 2015-08-10 (×4): qty 2

## 2015-08-10 MED ORDER — PROPOFOL 10 MG/ML IV BOLUS
INTRAVENOUS | Status: AC
  Filled 2015-08-10: qty 40

## 2015-08-10 MED ORDER — MEPERIDINE HCL 50 MG/ML IJ SOLN: 6.2500 mg | INTRAMUSCULAR | Status: DC | PRN

## 2015-08-10 MED ORDER — SODIUM CHLORIDE 0.9 % IV SOLN
INTRAVENOUS | Status: DC
  Administered 2015-08-10 – 2015-08-11 (×2): via INTRAVENOUS

## 2015-08-10 MED ORDER — CEFAZOLIN SODIUM-DEXTROSE 2-4 GM/100ML-% IV SOLN
2.0000 g | INTRAVENOUS | Status: AC
  Administered 2015-08-10: 2 g via INTRAVENOUS

## 2015-08-10 MED ORDER — FENTANYL CITRATE (PF) 100 MCG/2ML IJ SOLN
INTRAMUSCULAR | Status: AC
  Administered 2015-08-10: 50 ug via INTRAVENOUS
  Filled 2015-08-10: qty 2

## 2015-08-10 MED ORDER — PHENOL 1.4 % MT LIQD
1.0000 | OROMUCOSAL | Status: DC | PRN
  Filled 2015-08-10: qty 177

## 2015-08-10 MED ORDER — METOCLOPRAMIDE HCL 5 MG PO TABS
5.0000 mg | ORAL_TABLET | Freq: Three times a day (TID) | ORAL | Status: DC | PRN
  Filled 2015-08-10: qty 2

## 2015-08-10 MED ORDER — TRANEXAMIC ACID 1000 MG/10ML IV SOLN
1000.0000 mg | INTRAVENOUS | Status: AC
  Administered 2015-08-10: 1000 mg via INTRAVENOUS
  Filled 2015-08-10: qty 10

## 2015-08-10 MED ORDER — HYDROXYZINE HCL 25 MG PO TABS: 25.0000 mg | ORAL_TABLET | Freq: Three times a day (TID) | ORAL | Status: DC | PRN

## 2015-08-10 MED ORDER — DEXAMETHASONE SODIUM PHOSPHATE 10 MG/ML IJ SOLN
10.0000 mg | Freq: Once | INTRAMUSCULAR | Status: AC
  Administered 2015-08-10: 10 mg via INTRAVENOUS

## 2015-08-10 MED ORDER — METHOCARBAMOL 1000 MG/10ML IJ SOLN
500.0000 mg | Freq: Four times a day (QID) | INTRAVENOUS | Status: DC | PRN
  Administered 2015-08-10: 500 mg via INTRAVENOUS
  Filled 2015-08-10: qty 5
  Filled 2015-08-10: qty 550

## 2015-08-10 MED ORDER — POLYETHYLENE GLYCOL 3350 17 G PO PACK
17.0000 g | PACK | Freq: Two times a day (BID) | ORAL | Status: DC
  Administered 2015-08-10 – 2015-08-13 (×6): 17 g via ORAL
  Filled 2015-08-10 (×7): qty 1

## 2015-08-10 MED ORDER — INSULIN ASPART 100 UNIT/ML ~~LOC~~ SOLN
0.0000 [IU] | Freq: Three times a day (TID) | SUBCUTANEOUS | Status: DC
  Administered 2015-08-11: 4.2 [IU] via SUBCUTANEOUS
  Administered 2015-08-11: 3.8 [IU] via SUBCUTANEOUS

## 2015-08-10 MED ORDER — PROPOFOL 10 MG/ML IV BOLUS
INTRAVENOUS | Status: AC
  Filled 2015-08-10: qty 20

## 2015-08-10 MED ORDER — FERROUS SULFATE 325 (65 FE) MG PO TABS
325.0000 mg | ORAL_TABLET | Freq: Three times a day (TID) | ORAL | Status: DC
  Administered 2015-08-11 (×2): 325 mg via ORAL
  Filled 2015-08-10 (×2): qty 1

## 2015-08-10 MED ORDER — HYDROMORPHONE HCL 1 MG/ML IJ SOLN
0.5000 mg | INTRAMUSCULAR | Status: DC | PRN
  Administered 2015-08-10: 0.5 mg via INTRAVENOUS
  Filled 2015-08-10: qty 1

## 2015-08-10 MED ORDER — CEFAZOLIN SODIUM-DEXTROSE 2-4 GM/100ML-% IV SOLN
INTRAVENOUS | Status: AC
  Filled 2015-08-10: qty 100

## 2015-08-10 MED ORDER — ACETAMINOPHEN 10 MG/ML IV SOLN
1000.0000 mg | Freq: Once | INTRAVENOUS | Status: AC
  Administered 2015-08-10: 1000 mg via INTRAVENOUS

## 2015-08-10 MED ORDER — BISACODYL 10 MG RE SUPP: 10.0000 mg | Freq: Every day | RECTAL | Status: DC | PRN

## 2015-08-10 MED ORDER — HYDROCODONE-ACETAMINOPHEN 7.5-325 MG PO TABS
1.0000 | ORAL_TABLET | ORAL | Status: DC
  Administered 2015-08-10: 1 via ORAL
  Administered 2015-08-11: 2 via ORAL
  Administered 2015-08-11: 1 via ORAL
  Administered 2015-08-11: 2 via ORAL
  Administered 2015-08-11 – 2015-08-13 (×6): 1 via ORAL
  Filled 2015-08-10: qty 1
  Filled 2015-08-10 (×3): qty 2
  Filled 2015-08-10 (×8): qty 1

## 2015-08-10 MED ORDER — 0.9 % SODIUM CHLORIDE (POUR BTL) OPTIME
TOPICAL | Status: DC | PRN
  Administered 2015-08-10: 1000 mL

## 2015-08-10 SURGICAL SUPPLY — 47 items
BAG ZIPLOCK 12X15 (MISCELLANEOUS) ×2 IMPLANT
BRUSH FEMORAL CANAL (MISCELLANEOUS) ×2 IMPLANT
CUP TRID HEMIS MULTI (Orthopedic Implant) ×2 IMPLANT
DRAPE INCISE IOBAN 85X60 (DRAPES) ×2 IMPLANT
DRAPE ORTHO SPLIT 77X108 STRL (DRAPES) ×2
DRAPE POUCH INSTRU U-SHP 10X18 (DRAPES) ×2 IMPLANT
DRAPE SURG 17X11 SM STRL (DRAPES) ×2 IMPLANT
DRAPE SURG ORHT 6 SPLT 77X108 (DRAPES) ×2 IMPLANT
DRAPE U-SHAPE 47X51 STRL (DRAPES) ×2 IMPLANT
DRESSING AQUACEL AG SP 3.5X10 (GAUZE/BANDAGES/DRESSINGS) ×1 IMPLANT
DRSG AQUACEL AG SP 3.5X10 (GAUZE/BANDAGES/DRESSINGS) ×2
DURAPREP 26ML APPLICATOR (WOUND CARE) ×2 IMPLANT
ELECT BLADE TIP CTD 4 INCH (ELECTRODE) ×2 IMPLANT
ELECT REM PT RETURN 9FT ADLT (ELECTROSURGICAL) ×2
ELECTRODE REM PT RTRN 9FT ADLT (ELECTROSURGICAL) ×1 IMPLANT
FACESHIELD WRAPAROUND (MASK) ×10 IMPLANT
FEMORAL HEAD LFIT V40 28MM PL0 (Orthopedic Implant) ×2 IMPLANT
GLOVE BIOGEL PI IND STRL 7.0 (GLOVE) ×2 IMPLANT
GLOVE BIOGEL PI IND STRL 7.5 (GLOVE) ×4 IMPLANT
GLOVE BIOGEL PI IND STRL 8.5 (GLOVE) ×1 IMPLANT
GLOVE BIOGEL PI INDICATOR 7.0 (GLOVE) ×2
GLOVE BIOGEL PI INDICATOR 7.5 (GLOVE) ×4
GLOVE BIOGEL PI INDICATOR 8.5 (GLOVE) ×1
GLOVE ORTHO TXT STRL SZ7.5 (GLOVE) ×6 IMPLANT
GLOVE SURG ORTHO 8.0 STRL STRW (GLOVE) ×2 IMPLANT
GLOVE SURG SS PI 7.0 STRL IVOR (GLOVE) ×4 IMPLANT
GOWN STRL REUS W/TWL LRG LVL3 (GOWN DISPOSABLE) ×2 IMPLANT
GOWN STRL REUS W/TWL XL LVL3 (GOWN DISPOSABLE) ×8 IMPLANT
INSERT X3 ADM/MDM 28MM 28/48 (Orthopedic Implant) ×2 IMPLANT
LINER 42MM E (Orthopedic Implant) ×2 IMPLANT
LIQUID BAND (GAUZE/BANDAGES/DRESSINGS) ×2 IMPLANT
MANIFOLD NEPTUNE II (INSTRUMENTS) ×2 IMPLANT
POSITIONER SURGICAL ARM (MISCELLANEOUS) ×2 IMPLANT
SCREW OSTEO 20MM (Screw) ×2 IMPLANT
SCREW OSTEO 25MM (Screw) ×2 IMPLANT
SPONGE LAP 18X18 X RAY DECT (DISPOSABLE) ×2 IMPLANT
SPONGE LAP 4X18 X RAY DECT (DISPOSABLE) ×2 IMPLANT
SUCTION FRAZIER HANDLE 10FR (MISCELLANEOUS) ×1
SUCTION TUBE FRAZIER 10FR DISP (MISCELLANEOUS) ×1 IMPLANT
SUT MNCRL AB 3-0 PS2 18 (SUTURE) ×2 IMPLANT
SUT VIC AB 1 CT1 36 (SUTURE) ×4 IMPLANT
SUT VIC AB 2-0 CT1 27 (SUTURE) ×3
SUT VIC AB 2-0 CT1 TAPERPNT 27 (SUTURE) ×3 IMPLANT
SUT VLOC 180 0 24IN GS25 (SUTURE) ×2 IMPLANT
TOWEL OR 17X26 10 PK STRL BLUE (TOWEL DISPOSABLE) ×4 IMPLANT
TRAY FOLEY W/METER SILVER 14FR (SET/KITS/TRAYS/PACK) ×2 IMPLANT
YANKAUER SUCT BULB TIP 10FT TU (MISCELLANEOUS) ×2 IMPLANT

## 2015-08-10 NOTE — Interval H&P Note (Signed)
History and Physical Interval Note:  08/10/2015 7:16 AM  Cassandra Joyce  has presented today for surgery, with the diagnosis of FAILED RIGHT HIP HEMI LENGTHENING DYSTENTION   The various methods of treatment have been discussed with the patient and family. After consideration of risks, benefits and other options for treatment, the patient has consented to  Procedure(s): CONVERSION RIGHT HIP HEMI TO TOTAL HIP ARTHROPLASTY (Right) as a surgical intervention .  The patient's history has been reviewed, patient examined, no change in status, stable for surgery.  I have reviewed the patient's chart and labs.  Questions were answered to the patient's satisfaction.     Mauri Pole

## 2015-08-10 NOTE — Progress Notes (Signed)
Mrs Villamar's Insulin pump on RLQ, new site placed 08/09/15 per patient.  Battery life strong.  Basal rate currently from 4am-8am 0.325u then 8am-12noon basal rate 0.275 units.  Patient contract for Insulin Pump Therapy signed and is in chart. Diabetes coordinator and per Policy pt can leave insulin pump connected with her basal rate infusing.

## 2015-08-10 NOTE — Anesthesia Procedure Notes (Signed)
Spinal  Patient location during procedure: OR Start time: 08/10/2015 8:50 AM End time: 08/10/2015 9:55 AM Staffing Resident/CRNA: Gean Maidens Performed: resident/CRNA  Preanesthetic Checklist Completed: patient identified, site marked, surgical consent, pre-op evaluation, timeout performed, IV checked, risks and benefits discussed and monitors and equipment checked Spinal Block Patient position: sitting Prep: Betasept Patient monitoring: heart rate, continuous pulse ox and blood pressure Approach: midline Location: L3-4 Injection technique: single-shot Needle Needle type: Sprotte  Needle gauge: 22 G Needle length: 9 cm Needle insertion depth: 6 cm Additional Notes Pt sitting position, sterile prep and drape, no complications noted

## 2015-08-10 NOTE — Anesthesia Postprocedure Evaluation (Signed)
Anesthesia Post Note  Patient: Cassandra Joyce  Procedure(s) Performed: Procedure(s) (LRB): CONVERSION RIGHT HIP HEMI TO TOTAL HIP ARTHROPLASTY (Right)  Patient location during evaluation: PACU Anesthesia Type: Spinal Level of consciousness: oriented and awake and alert Pain management: pain level controlled Vital Signs Assessment: post-procedure vital signs reviewed and stable Respiratory status: spontaneous breathing, respiratory function stable and patient connected to nasal cannula oxygen Cardiovascular status: blood pressure returned to baseline and stable Postop Assessment: no headache and no backache Anesthetic complications: no    Last Vitals:  Vitals:   08/10/15 1247 08/10/15 1347  BP: (!) 123/49 (!) 121/45  Pulse: 65 65  Resp: 16 18  Temp: 36.4 C 36.4 C    Last Pain:  Vitals:   08/10/15 1347  TempSrc: Oral  PainSc:                  [name]

## 2015-08-10 NOTE — Evaluation (Signed)
Physical Therapy Evaluation Patient Details Name: Rosio W [name] MRN: [id number] DOB: [birth year] Today's Date: 08/10/2015   History of Present Illness  70 y.o. female admitted for hip hemiarthroplasty (March 2017) to posterior THA conversion. PMH: DM, HTN  Clinical Impression  Pt is s/p THA resulting in the deficits listed below (see PT Problem List). Min A for bed to recliner, activity limited by nausea. Initiated THA exercises, reviewed posterior precautions with pt/spouse.  Pt will benefit from skilled PT to increase their independence and safety with mobility to allow discharge to the venue listed below.      Follow Up Recommendations Home health PT    Equipment Recommendations  None recommended by PT    Recommendations for Other Services       Precautions / Restrictions Precautions Precautions: Posterior Hip Precaution Booklet Issued: Yes (comment) Restrictions Weight Bearing Restrictions: Yes RLE Weight Bearing: Partial weight bearing RLE Partial Weight Bearing Percentage or Pounds: 50%      Mobility  Bed Mobility Overal bed mobility: Needs Assistance Bed Mobility: Supine to Sit     Supine to sit: Min assist     General bed mobility comments: min A for RLE  Transfers Overall transfer level: Needs assistance Equipment used: Rolling walker (2 wheeled) Transfers: Sit to/from [facility] Sit to Stand: Min assist Stand pivot transfers: Min assist       General transfer comment: VCs hand placement, min A to rise/steady; SPT to recliner with VCs for hip precautions, activity limited by nausea  Ambulation/Gait                Stairs            Wheelchair Mobility    Modified Rankin (Stroke Patients Only)       Balance Overall balance assessment: Modified Independent                                           Pertinent Vitals/Pain Pain Assessment: 0-10 Pain Score: 6  Pain Location: R hip with activity Pain  Descriptors / Indicators: Sore Pain Intervention(s): Limited activity within patient's tolerance;Monitored during session;Ice applied    Home Living Family/patient expects to be discharged to:: Private residence Living Arrangements: Spouse/significant other Available Help at Discharge: Family Type of Home: House Home Access: Stairs to enter Entrance Stairs-Rails: Right Entrance Stairs-Number of Steps: [place]: Two level;Bed/bath upstairs;Able to live on main level with bedroom/bathroom Home Equipment: Gilford Rile - 2 wheels;Cane - single point;Bedside commode;Toilet riser      Prior Function Level of Independence: Independent         Comments: Avid gardening, perfromed yardwork, driving     Hand Dominance        Extremity/Trunk Assessment   Upper Extremity Assessment: Overall WFL for tasks assessed           Lower Extremity Assessment: RLE deficits/detail RLE Deficits / Details: knee ext 3/5, ankle WNL, hip AAROM limited 40% 2* pain    Cervical / Trunk Assessment: Normal  Communication   Communication: No difficulties  Cognition Arousal/Alertness: Awake/alert Behavior During Therapy: WFL for tasks assessed/performed Overall Cognitive Status: Within Functional Limits for tasks assessed                      General Comments      Exercises Total Joint Exercises Ankle Circles/Pumps: AROM;Both;10 reps;Supine Quad Sets: AROM;Right;5  reps;Supine Heel Slides: AAROM;Right;10 reps;Supine Hip ABduction/ADduction: AAROM;Right;10 reps;Supine      Assessment/Plan    PT Assessment Patient needs continued PT services  PT Diagnosis Difficulty walking;Acute pain   PT Problem List Decreased strength;Decreased range of motion;Decreased activity tolerance;Decreased mobility;Decreased knowledge of precautions  PT Treatment Interventions DME instruction;Gait training;Stair training;Functional mobility training;Balance training;Therapeutic activities;Therapeutic  exercise   PT Goals (Current goals can be found in the Care Plan section) Acute Rehab PT Goals Patient Stated Goal: gardening, walking dog PT Goal Formulation: With patient/family Time For Goal Achievement: 08/17/15 Potential to Achieve Goals: Good    Frequency 7X/week   Barriers to discharge        Co-evaluation               End of Session Equipment Utilized During Treatment: Gait belt Activity Tolerance: Treatment limited secondary to medical complications (Comment) (nausea) Patient left: in chair;with call bell/phone within reach;with nursing/sitter in room;with chair alarm set;with family/visitor present Nurse Communication: Mobility status (nausea)         Time: 1445-1510 PT Time Calculation (min) (ACUTE ONLY): 25 min   Charges:   PT Evaluation $PT Eval Low Complexity: 1 Procedure PT Treatments $Gait Training: 8-22 mins   PT G Codes:        Philomena Doheny 08/10/2015, 4:06 PM [phone]

## 2015-08-10 NOTE — Transfer of Care (Signed)
Immediate Anesthesia Transfer of Care Note  Patient: Cassandra Joyce  Procedure(s) Performed: Procedure(s): CONVERSION RIGHT HIP HEMI TO TOTAL HIP ARTHROPLASTY (Right)  Patient Location: PACU  Anesthesia Type:Spinal  Level of Consciousness:  sedated, patient cooperative and responds to stimulation  Airway & Oxygen Therapy:Patient Spontanous Breathing and Patient connected to face mask oxgen  Post-op Assessment:  Report given to PACU RN and Post -op Vital signs reviewed and stable  Post vital signs:  Reviewed and stable  Last Vitals: There were no vitals filed for this visit.  Complications: No apparent anesthesia complications

## 2015-08-11 LAB — PREPARE RBC (CROSSMATCH)

## 2015-08-11 LAB — BASIC METABOLIC PANEL
ANION GAP: 5 (ref 5–15)
BUN: 16 mg/dL (ref 6–20)
CHLORIDE: 107 mmol/L (ref 101–111)
CO2: 27 mmol/L (ref 22–32)
Calcium: 8.4 mg/dL — ABNORMAL LOW (ref 8.9–10.3)
Creatinine, Ser: 0.66 mg/dL (ref 0.44–1.00)
GFR calc Af Amer: >60 mL/min (ref >60)
GLUCOSE: 244 mg/dL — AB (ref 65–99)
POTASSIUM: 4.8 mmol/L (ref 3.5–5.1)
Sodium: 139 mmol/L (ref 135–145)

## 2015-08-11 LAB — CBC
HEMATOCRIT: 20.6 % — AB (ref 36.0–46.0)
HEMOGLOBIN: 7 g/dL — AB (ref 12.0–15.0)
MCH: 28.8 pg (ref 26.0–34.0)
MCHC: 34 g/dL (ref 30.0–36.0)
MCV: 84.8 fL (ref 78.0–100.0)
PLATELETS: 137 10*3/uL — AB (ref 150–400)
RBC: 2.43 MIL/uL — AB (ref 3.87–5.11)
RDW: 13.7 % (ref 11.5–15.5)
WBC: 9.9 10*3/uL (ref 4.0–10.5)

## 2015-08-11 LAB — GLUCOSE, CAPILLARY
GLUCOSE-CAPILLARY: 304 mg/dL — AB (ref 65–99)
GLUCOSE-CAPILLARY: 293 mg/dL — AB (ref 65–99)
Glucose-Capillary: 279 mg/dL — ABNORMAL HIGH (ref 65–99)
Glucose-Capillary: 339 mg/dL — ABNORMAL HIGH (ref 65–99)

## 2015-08-11 MED ORDER — TIZANIDINE HCL 4 MG PO TABS: 4.0000 mg | ORAL_TABLET | Freq: Four times a day (QID) | ORAL | 0 refills | Status: DC | PRN

## 2015-08-11 MED ORDER — SODIUM CHLORIDE 0.9 % IV SOLN: Freq: Once | INTRAVENOUS | Status: DC

## 2015-08-11 MED ORDER — HYDROCODONE-ACETAMINOPHEN 7.5-325 MG PO TABS: 1.0000 | ORAL_TABLET | ORAL | 0 refills | Status: DC | PRN

## 2015-08-11 MED ORDER — POLYETHYLENE GLYCOL 3350 17 G PO PACK: 17.0000 g | PACK | Freq: Two times a day (BID) | ORAL | 0 refills | Status: DC

## 2015-08-11 MED ORDER — DOCUSATE SODIUM 100 MG PO CAPS: 100.0000 mg | ORAL_CAPSULE | Freq: Two times a day (BID) | ORAL | 0 refills | Status: DC

## 2015-08-11 MED ORDER — ASPIRIN 81 MG PO CHEW: 81.0000 mg | CHEWABLE_TABLET | Freq: Two times a day (BID) | ORAL | 0 refills | Status: DC

## 2015-08-11 MED ORDER — FERROUS SULFATE 325 (65 FE) MG PO TABS: 325.0000 mg | ORAL_TABLET | Freq: Three times a day (TID) | ORAL | 3 refills | Status: DC

## 2015-08-11 MED ORDER — INSULIN PUMP
Freq: Three times a day (TID) | SUBCUTANEOUS | Status: DC
  Administered 2015-08-11: 5.7 via SUBCUTANEOUS
  Administered 2015-08-12: 3.7 via SUBCUTANEOUS
  Administered 2015-08-12: 2 via SUBCUTANEOUS
  Administered 2015-08-12: 1.6 via SUBCUTANEOUS
  Administered 2015-08-12: 5.1 via SUBCUTANEOUS
  Administered 2015-08-13: 3 via SUBCUTANEOUS
  Administered 2015-08-13: 0.9 via SUBCUTANEOUS
  Administered 2015-08-14: 0.7 via SUBCUTANEOUS
  Administered 2015-08-14: 0.8 via SUBCUTANEOUS
  Administered 2015-08-14: 0.275 via SUBCUTANEOUS
  Filled 2015-08-11: qty 1

## 2015-08-11 NOTE — Progress Notes (Signed)
Physical Therapy Treatment Patient Details Name: Cassandra Joyce MRN: [id number] DOB: 1945-03-30 Today's Date: 08/11/2015    History of Present Illness 70 y.o. female admitted for hip hemiarthroplasty (March 2017) to posterior THA conversion. PMH: DM, HTN    PT Comments    POD # 1 am session Assisted pt OOB with increased time due to c/o nausea.  Assisted to [facility].   Assisted with amb a limited distance due to MAX c/o weakness/fatigue.  Pt required extended rest breaks between each activity to minimize her feelings of nausea and fatigue.    Follow Up Recommendations  Home health PT     Equipment Recommendations  None recommended by PT    Recommendations for Other Services       Precautions / Restrictions Precautions Precautions: Posterior Hip Precaution Booklet Issued: Yes (comment) Precaution Comments: instructed on THP and 50% WBing Restrictions Weight Bearing Restrictions: Yes RLE Weight Bearing: Partial weight bearing RLE Partial Weight Bearing Percentage or Pounds: 50%    Mobility  Bed Mobility Overal bed mobility: Needs Assistance Bed Mobility: Supine to Sit     Supine to sit: Min assist     General bed mobility comments: min A for RLE and increased time   Transfers Overall transfer level: Needs assistance   Transfers: Sit to/from [employer] Transfers Sit to Stand: Min assist Stand pivot transfers: Min assist       General transfer comment: 25% VC's on proper tech and THP/WBing.  Assisted from bed to [facility] then from [facility] to standing  Ambulation/Gait Ambulation/Gait assistance: Min assist Ambulation Distance (Feet): 24 Feet Assistive device: Rolling walker (2 wheeled) Gait Pattern/deviations: Step-to pattern Gait velocity: decreased   General Gait Details: instructed on step to due to [facility].  Also instrucred on proper walker to self distance and safety with turns.  Limited amb distance due to MAX c/o fatigue/weakness and mod nausea.     Stairs             Wheelchair Mobility    Modified Rankin (Stroke Patients Only)       Balance                                    Cognition Arousal/Alertness: Awake/alert Behavior During Therapy: WFL for tasks assessed/performed Overall Cognitive Status: Within Functional Limits for tasks assessed                      Exercises      General Comments        Pertinent Vitals/Pain Pain Assessment: No/denies pain Pain Score: 3  Pain Location: R hip with activity Pain Descriptors / Indicators: Tender Pain Intervention(s): Monitored during session;Repositioned;Ice applied    Home Living                      Prior Function            PT Goals (current goals can now be found in the care plan section) Progress towards PT goals: Progressing toward goals    Frequency  7X/week    PT Plan      Co-evaluation             End of Session Equipment Utilized During Treatment: Gait belt Activity Tolerance: Patient limited by fatigue Patient left: in chair;with call bell/phone within reach;with nursing/sitter in room;with chair alarm set;with family/visitor present     Time: [id number]  PT Time Calculation (min) (ACUTE ONLY): 44 min  Charges:  $Gait Training: 8-22 mins $Therapeutic Activity: 23-37 mins                    G Codes:      Rica Koyanagi  PTA [facility]  Acute  Rehab Pager      [phone]

## 2015-08-11 NOTE — Progress Notes (Signed)
Occupational Therapy Evaluation Patient Details Name: Cassandra Joyce MRN: [id number] DOB: 1946/01/02 Today's Date: 08/11/2015    History of Present Illness 70 y.o. female admitted for hip hemiarthroplasty (March 2017) to posterior THA conversion. PMH: DM, HTN   Clinical Impression   Limited evaluation this am due to pt with nausea and dizziness up to [facility] with nursing staff. OT will follow to address ADL independence and safety.     Follow Up Recommendations  No OT follow up;Supervision/Assistance - 24 hour    Equipment Recommendations  None recommended by OT    Recommendations for Other Services       Precautions / Restrictions Precautions Precautions: Posterior Hip Precaution Booklet Issued: Yes (comment) Precaution Comments: instructed on THP and 50% WBing Restrictions Weight Bearing Restrictions: Yes RLE Weight Bearing: Partial weight bearing RLE Partial Weight Bearing Percentage or Pounds: 50%      Mobility Bed Mobility              Transfers                 Balance                                            ADL Overall ADL's : Needs assistance/impaired Eating/Feeding: Set up;Bed level   Grooming: Set up;Bed level               Lower Body Dressing: Total assistance;Adhering to hip precautions;Bed level                 General ADL Comments: Patient was educated on posterior hip precautions and PWB status and ADL implications. She has AE and is familiar with its uses. Husband assisted with LB ADLs as well. They have both a walk-in shower with ledge and a tub/shower with tub bench. Educated that because of PWB status, tub bench would be safer transfer at this time. Eval limited by pt feeling nauseated, dizzy when up to [facility] with nursing earlier this morning.      Vision     Perception     Praxis      Pertinent Vitals/Pain Pain Assessment: 0-10 Pain Score: 3  Pain Location: R hip Pain Descriptors /  Indicators: Burning;Throbbing Pain Intervention(s): Limited activity within patient's tolerance;Monitored during session     Hand Dominance     Extremity/Trunk Assessment Upper Extremity Assessment Upper Extremity Assessment: Overall WFL for tasks assessed   Lower Extremity Assessment Lower Extremity Assessment: Defer to PT evaluation       Communication Communication Communication: No difficulties   Cognition Arousal/Alertness: Awake/alert Behavior During Therapy: WFL for tasks assessed/performed Overall Cognitive Status: Within Functional Limits for tasks assessed                     General Comments       Exercises       Shoulder Instructions      Home Living Family/patient expects to be discharged to:: Private residence Living Arrangements: Spouse/significant other Available Help at Discharge: Family Type of Home: House Home Access: Stairs to enter Technical brewer of Steps: 6 Entrance Stairs-Rails: Right Home Layout: Two level;Bed/bath upstairs;Able to live on main level with bedroom/bathroom Alternate Level Stairs-Number of Steps: 1/2 bath and recliner on 1st floor; 13 stairs to 2nd level   Bathroom Shower/Tub: Walk-in shower;Tub/shower unit   [employer]: Standard  Home Equipment: [place] - 2 wheels;Cane - single point;Bedside commode;Toilet riser;Tub bench;Adaptive equipment Adaptive Equipment: Reacher;Sock aid;Long-handled shoe horn;Long-handled sponge        Prior Functioning/Environment Level of Independence: Needs assistance    ADL's / Homemaking Assistance Needed: husband was assisting with LB dressing        OT Diagnosis: Acute pain   OT Problem List: Decreased strength;Decreased range of motion;Decreased activity tolerance;Decreased knowledge of use of DME or AE;Decreased knowledge of precautions;Pain   OT Treatment/Interventions: Self-care/ADL training;DME and/or AE instruction;Therapeutic activities;Patient/family  education    OT Goals(Current goals can be found in the care plan section) Acute Rehab OT Goals Patient Stated Goal: gardening, walking dog OT Goal Formulation: With patient Time For Goal Achievement: 08/25/15 Potential to Achieve Goals: Good ADL Goals Pt Will Perform Lower Body Bathing: with min assist;with adaptive equipment;sit to/from stand Pt Will Perform Lower Body Dressing: with min assist;with adaptive equipment;sit to/from stand Pt Will Transfer to Toilet: with supervision;ambulating;bedside commode Pt Will Perform Toileting - Clothing Manipulation and hygiene: with supervision;sit to/from stand Pt Will Perform Tub/Shower Transfer: Tub transfer;with min assist;tub bench;ambulating;rolling walker  OT Frequency: Min 2X/week   Barriers to D/C:            Co-evaluation              End of Session Nurse Communication: Mobility status  Activity Tolerance: Patient tolerated treatment well Patient left: in bed;with call bell/phone within reach;with family/visitor present   Time: 1120-1135 OT Time Calculation (min): 15 min Charges:  OT General Charges $OT Visit: 1 Procedure OT Evaluation $OT Eval Low Complexity: 1 Procedure G-Codes:    Ilean Spradlin A August 31, 2015, 1:48 PM

## 2015-08-11 NOTE — Progress Notes (Signed)
Physical Therapy Treatment Patient Details Name: Cassandra Joyce MRN: [id number] DOB: 05-07-1945 Today's Date: 08/11/2015    History of Present Illness 70 y.o. female admitted for hip hemiarthroplasty (March 2017) to posterior THA conversion. PMH: DM, HTN    PT Comments    POD # 1 pm session Session limited by vomiting.  Assisted from recliner to [facility] to void then amb in hallway.  Then assisted back to bed.  MAX c/o feeling weak and inability to hold down any food.    Follow Up Recommendations  Home health PT     Equipment Recommendations  None recommended by PT    Recommendations for Other Services       Precautions / Restrictions Precautions Precautions: Posterior Hip Precaution Booklet Issued: Yes (comment) Precaution Comments: instructed on THP and 50% WBing Restrictions Weight Bearing Restrictions: Yes RLE Weight Bearing: Partial weight bearing RLE Partial Weight Bearing Percentage or Pounds: 50%    Mobility  Bed Mobility Overal bed mobility: Needs Assistance Bed Mobility: Sit to Supine     Supine to sit: Min assist Sit to supine: Min assist   General bed mobility comments: min A for RLE and increased time   Transfers Overall transfer level: Needs assistance Equipment used: Rolling walker (2 wheeled) Transfers: Sit to/from [facility] Sit to Stand: Min assist Stand pivot transfers: Min assist       General transfer comment: 25% VC's on proper tech and THP/WBing.  Assisted from recliner to [facility] then from [facility] to standing to amb  Ambulation/Gait Ambulation/Gait assistance: Min assist Ambulation Distance (Feet): 18 Feet Assistive device: Rolling walker (2 wheeled) Gait Pattern/deviations: Step-to pattern Gait velocity: decreased   General Gait Details: instructed on step to due to [facility].  Also instrucred on proper walker to self distance and safety with turns.  Limited amb distance due to MAX c/o fatigue/weakness and mod nausea.     Stairs            Wheelchair Mobility    Modified Rankin (Stroke Patients Only)       Balance                                    Cognition Arousal/Alertness: Awake/alert Behavior During Therapy: WFL for tasks assessed/performed Overall Cognitive Status: Within Functional Limits for tasks assessed                      Exercises      General Comments        Pertinent Vitals/Pain Pain Assessment: 0-10 Pain Score: 3  Pain Location: R hip Pain Descriptors / Indicators: Burning Pain Intervention(s): Monitored during session;Repositioned;Ice applied    Home Living Family/patient expects to be discharged to:: Private residence Living Arrangements: Spouse/significant other Available Help at Discharge: Family Type of Home: House Home Access: Stairs to enter Entrance Stairs-Rails: Right Home Layout: Two level;Bed/bath upstairs;Able to live on main level with bedroom/bathroom Home Equipment: Gilford Rile - 2 wheels;Cane - single point;Bedside commode;Toilet riser;Tub bench;Adaptive equipment      Prior Function Level of Independence: Needs assistance    ADL's / Homemaking Assistance Needed: husband was assisting with LB dressing     PT Goals (current goals can now be found in the care plan section) Acute Rehab PT Goals Patient Stated Goal: gardening, walking dog Progress towards PT goals: Progressing toward goals    Frequency  7X/week    PT Plan  Current plan remains appropriate    Co-evaluation             End of Session Equipment Utilized During Treatment: Gait belt Activity Tolerance: Patient limited by fatigue Patient left: in chair;with call bell/phone within reach;with nursing/sitter in room;with chair alarm set;with family/visitor present     Time: 1411-1441 PT Time Calculation (min) (ACUTE ONLY): 30 min  Charges:  $Gait Training: 8-22 mins $Therapeutic Activity: 8-22 mins                    G Codes:      Rica Koyanagi  PTA [facility]   Acute  Rehab Pager      [phone]

## 2015-08-11 NOTE — Op Note (Signed)
NAME:  Cassandra Joyce, Cassandra Joyce                ACCOUNT NO.:  [id number]  MEDICAL RECORD NO.:  [id number]  LOCATION:  [id number]                         FACILITY:  [facility]  PHYSICIAN:  Pietro Cassis. Alvan Dame, M.D.  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  PROCEDURE:  Conversion of a right hip hemiarthroplasty to right total hip arthroplasty utilizing a Stryker hip system.  We used the size 52 Trident hemispherical multihole cup with two cancellous bone screws into the ilium, a 42E MDM metal cup liner and 42E MDM poly liner and a 28+ 12 V40 head.  SURGEON:  Pietro Cassis. Alvan Dame, M.D.  ASSISTANT:  Danae Orleans, PA-C.  Note that Mr. Guinevere Scarlet was present for the entirety of the case from preoperative positioning, perioperative management of the operative extremity, general facilitation of the case and primary wound closure.  ANESTHESIA:  Spinal.  SPECIMENS:  None.  COMPLICATION:  None.  BLOOD LOSS:  About a liter.  DRAINS:  None.  INDICATIONS FOR PROCEDURE:  Ms. Cassandra Joyce is a 70 year old female, who had been seen and evaluated for second opinion evaluation after hip hemiarthroplasty.  She not only had complaints of pain in the anterior aspect of the hip and had significant concerns regarding leg length discrepancy on this right side.  Radiographs had indicated that she did have shortening of about a cm. Discussing the risks, limitations and options available, at this point, she wished to proceed with conversion to a total hip arthroplasty.  I discussed with her that removing the femoral stem could  cause significant amount of morbidity associated with the removal of the component and now I tried to gain the most through the acetabular component.  She wished at this point to proceed for improved functional ability and pain relief.  Consent was obtained after reviewing standard risks of infection, DVT, component failure, need for future revision surgery.  PROCEDURE IN DETAIL:  The patient was brought to the operative theater. Once adequate anesthesia, preoperative antibiotics, Ancef, 1 g of tranexamic acid and 10 mg of Decadron administered, she was positioned in the left lateral decubitus position with the right side up.  Once she was adequately positioned and padded, the right lower extremity was prepped and draped in sterile fashion.  Time-out was performed identifying the patient, planned procedure, and extremity.  Her old incision was utilized.  Soft tissue excised down to the iliotibial band and gluteal fascia.  The posterior approach to the hip was then carried out.  At this point, I exposed the posterior aspect of hip encountering clear synovial fluid.  She does have a history of diabetes and her tissue was noted to be very tight.  She is also tight related to her shortness.  Once I was able to expose the posterior two-thirds or so of the hip, we were able to dislocate the  hip.  I then removed the hemiarthroplasty ball, noted to be 46 mm in diameter.  At this point, I spent time exposing the acetabulum.  Retractors were placed inferior and anterior as well as cerebellar retractor posterior.  This did take some time and effort to mobilize her soft tissues particularly get her trunnion out of the way.  Once I was able to get the hip adequately exposed, I began reaming with a 45 reamer and reamed up to 51-mm reamer.  This did not remove a significant amount or any complicated amount of bone, but just enough to create a cancellous bed.  We elected to use a 52 hemispherical  shell, this was then impacted.  I confirmed the orientation using the hip guide.  Two cancellous screws were placed into the ilium.  At this point, based on the system utilized with a Stryker system, we felt that we would get best opportunity to improve offset and length by using the MDM system. The increased thickness of the polyethylene as well as the head size gave me multiple options in order to improve this.  At this point, I did place the 42E MDM cup liner and then did trial reductions with a 42E trial liner and head balls.  I felt that I was able to restore leg lengths near equal using the +12 ball.  For this reason, the final 42E polyethylene liner as well as the 28 +12 metal ball was opened.  The two were configured on the back table under direct supervision and then impacted under clean and dry trunnion.  The hip was reduced.  The hip was irrigated throughout the case again at this point. I reapproximated the iliotibial band and gluteal fascia using #1 Vicryl and 0 V-Loc suture.  There was no capsular repair due to exposure, necessary.  The remainder of the wound was closed with 2-0 Vicryl and running 3-0 Monocryl.  The hip was then cleaned, dried and dressed sterilely using surgical glue and Aquacel dressing.  She was brought to the recovery room in stable condition tolerating the procedure well.  I will have her be partial weightbearing for 4-6 weeks to allow for acetabular bony ingrowth.     Pietro Cassis Alvan Dame, M.D.     MDO/MEDQ  D:  08/11/2015  T:  08/11/2015  Job:  [id number]

## 2015-08-11 NOTE — Progress Notes (Signed)
Inpatient Diabetes Program Recommendations  AACE/ADA: New Consensus Statement on Inpatient Glycemic Control (2015)  Target Ranges:  Prepandial:   less than 140 mg/dL      Peak postprandial:   less than 180 mg/dL (1-2 hours)      Critically ill patients:  140 - 180 mg/dL   Lab Results  Component Value Date   GLUCAP 339 (H) 08/11/2015   HGBA1C 7.2 (H) 08/03/2015    Review of Glycemic Control  Diabetes history: DM Outpatient Diabetes medications: Insulin pump Current orders for Inpatient glycemic control: moderate correction tidwc  Inpatient Diabetes Program Recommendations:    Noted patient using home insulin pump. Patient self-manages her pump by entering her carbohydrate intake and her cbg.  Assisted RN with entering the insulin pump order set and to get the contract signed by the patient that patient is responsible for her own pump supplies and operation of the pump. Patient demonstrated accurately how she operates her insulin pump. Patient also requested a Regular diet as she states the carbohydrate modified is makiing her sick to her stomach. She states that she is always on a regular diet at home and enters her carbohydrate intake in grams and cbg value into the pump information which then calculates the bolus need at that time-the patient then accepts and confirms the bolus amount given to the patient at that time. Discontinued the moderate correction order set which is not presently being used nor needed while patient is able to operate her pump. Entered orders for Regular diet with co-sign required and discontinued the correction insulin (which is covered using her pump) and entered the pump order set.  (RN confirms that to get these orders confirmed, we would have to speak to Dr Alvan Dame and/or the PA, thus orders need a co-sing.)  Thank you Rosita Kea, RN, MSN, CDE  Diabetes Inpatient Program Office: [phone] Pager: [phone] 8:00 am to 5:00 pm

## 2015-08-11 NOTE — Care Management Note (Signed)
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, dates discussed:    Additional Comments: CM met with pt and spouse of pt in room to offer choice of home health agency.  Pt chooses [facility].  Referral given to [facility] rep, Tim.  Pt has both the rolling walker and 3n1 from previous surgery.  No other CM needs were communicated. Dellie Catholic, RN 08/11/2015, 1:46 PM

## 2015-08-11 NOTE — Discharge Instructions (Signed)
INSTRUCTIONS AFTER JOINT REPLACEMENT   o Remove items at home which could result in a fall. This includes throw rugs or furniture in walking pathways o ICE to the affected joint every three hours while awake for 30 minutes at a time, for at least the first 3-5 days, and then as needed for pain and swelling.  Continue to use ice for pain and swelling. You may notice swelling that will progress down to the foot and ankle.  This is normal after surgery.  Elevate your leg when you are not up walking on it.   o Continue to use the breathing machine you got in the hospital (incentive spirometer) which will help keep your temperature down.  It is common for your temperature to cycle up and down following surgery, especially at night when you are not up moving around and exerting yourself.  The breathing machine keeps your lungs expanded and your temperature down.   DIET:  As you were doing prior to hospitalization, we recommend a well-balanced diet.  DRESSING / WOUND CARE / SHOWERING  Keep the surgical dressing until follow up.  The dressing is water proof, so you can shower without any extra covering.  IF THE DRESSING FALLS OFF or the wound gets wet inside, change the dressing with sterile gauze.  Please use good hand washing techniques before changing the dressing.  Do not use any lotions or creams on the incision until instructed by your surgeon.    ACTIVITY  o Increase activity slowly as tolerated, but follow the weight bearing instructions below.   o No driving for 6 weeks or until further direction given by your physician.  You cannot drive while taking narcotics.  o No lifting or carrying greater than 10 lbs. until further directed by your surgeon. o Avoid periods of inactivity such as sitting longer than an hour when not asleep. This helps prevent blood clots.  o You may return to work once you are authorized by your doctor.     WEIGHT BEARING   Partial weight bearing with assist device as  directed.  50% weight bearing on the right leg   EXERCISES  Results after joint replacement surgery are often greatly improved when you follow the exercise, range of motion and muscle strengthening exercises prescribed by your doctor. Safety measures are also important to protect the joint from further injury. Any time any of these exercises cause you to have increased pain or swelling, decrease what you are doing until you are comfortable again and then slowly increase them. If you have problems or questions, call your caregiver or physical therapist for advice.   Rehabilitation is important following a joint replacement. After just a few days of immobilization, the muscles of the leg can become weakened and shrink (atrophy).  These exercises are designed to build up the tone and strength of the thigh and leg muscles and to improve motion. Often times heat used for twenty to thirty minutes before working out will loosen up your tissues and help with improving the range of motion but do not use heat for the first two weeks following surgery (sometimes heat can increase post-operative swelling).   These exercises can be done on a training (exercise) mat, on the floor, on a table or on a bed. Use whatever works the best and is most comfortable for you.    Use music or television while you are exercising so that the exercises are a pleasant break in your day. This will make  your life better with the exercises acting as a break in your routine that you can look forward to.   Perform all exercises about fifteen times, three times per day or as directed.  You should exercise both the operative leg and the other leg as well.  Exercises include:    Quad Sets - Tighten up the muscle on the front of the thigh (Quad) and hold for 5-10 seconds.    Straight Leg Raises - With your knee straight (if you were given a brace, keep it on), lift the leg to 60 degrees, hold for 3 seconds, and slowly lower the leg.   Perform this exercise against resistance later as your leg gets stronger.   Leg Slides: Lying on your back, slowly slide your foot toward your buttocks, bending your knee up off the floor (only go as far as is comfortable). Then slowly slide your foot back down until your leg is flat on the floor again.   Angel Wings: Lying on your back spread your legs to the side as far apart as you can without causing discomfort.   Hamstring Strength:  Lying on your back, push your heel against the floor with your leg straight by tightening up the muscles of your buttocks.  Repeat, but this time bend your knee to a comfortable angle, and push your heel against the floor.  You may put a pillow under the heel to make it more comfortable if necessary.   A rehabilitation program following joint replacement surgery can speed recovery and prevent re-injury in the future due to weakened muscles. Contact your doctor or a physical therapist for more information on knee rehabilitation.    CONSTIPATION  Constipation is defined medically as fewer than three stools per week and severe constipation as less than one stool per week.  Even if you have a regular bowel pattern at home, your normal regimen is likely to be disrupted due to multiple reasons following surgery.  Combination of anesthesia, postoperative narcotics, change in appetite and fluid intake all can affect your bowels.   YOU MUST use at least one of the following options; they are listed in order of increasing strength to get the job done.  They are all available over the counter, and you may need to use some, POSSIBLY even all of these options:    Drink plenty of fluids (prune juice may be helpful) and high fiber foods Colace 100 mg by mouth twice a day  Senokot for constipation as directed and as needed Dulcolax (bisacodyl), take with full glass of water  Miralax (polyethylene glycol) once or twice a day as needed.  If you have tried all these things and  are unable to have a bowel movement in the first 3-4 days after surgery call either your surgeon or your primary doctor.    If you experience loose stools or diarrhea, hold the medications until you stool forms back up.  If your symptoms do not get better within 1 week or if they get worse, check with your doctor.  If you experience "the worst abdominal pain ever" or develop nausea or vomiting, please contact the office immediately for further recommendations for treatment.   ITCHING:  If you experience itching with your medications, try taking only a single pain pill, or even half a pain pill at a time.  You can also use Benadryl over the counter for itching or also to help with sleep.   TED HOSE STOCKINGS:  Use stockings on  both legs until for at least 2 weeks or as directed by physician office. They may be removed at night for sleeping.  MEDICATIONS:  See your medication summary on the After Visit Summary that nursing will review with you.  You may have some home medications which will be placed on hold until you complete the course of blood thinner medication.  It is important for you to complete the blood thinner medication as prescribed.  PRECAUTIONS:  If you experience chest pain or shortness of breath - call 911 immediately for transfer to the hospital emergency department.   If you develop a fever greater that 101 F, purulent drainage from wound, increased redness or drainage from wound, foul odor from the wound/dressing, or calf pain - CONTACT YOUR SURGEON.                                                   FOLLOW-UP APPOINTMENTS:  If you do not already have a post-op appointment, please call the office for an appointment to be seen by your surgeon.  Guidelines for how soon to be seen are listed in your After Visit Summary, but are typically between 1-4 weeks after surgery.  OTHER INSTRUCTIONS:   Knee Replacement:  Do not place pillow under knee, focus on keeping the knee straight  while resting.   MAKE SURE YOU:   Understand these instructions.   Get help right away if you are not doing well or get worse.    Thank you for letting us be a part of your medical care team.  It is a privilege we respect greatly.  We hope these instructions will help you stay on track for a fast and full recovery!

## 2015-08-11 NOTE — Brief Op Note (Signed)
08/10/2015  12:12 PM  PATIENT:  Cassandra Joyce  70 y.o. 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   Delay start of Pharmacological VTE agent (>24hrs) due to surgical blood loss or risk of bleeding: no

## 2015-08-11 NOTE — Progress Notes (Signed)
Patient ID: Cassandra Joyce, female   DOB: Aug 07, 1945, 70 y.o.   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  Up in bed eating breakfast.  Husband in room.  No events, no major complaints (despite hgb of 7)  Objective:   VITALS:   Vitals:   08/11/15 0609 08/11/15 1025  BP: 98/62 (!) 115/52  Pulse: 76 70  Resp: 18 18  Temp: 97.7 F (36.5 C) 97.8 F (36.6 C)    Neurovascular intact Incision: dressing C/D/I  LABS  Recent Labs  08/11/15 0512  HGB 7.0*  HCT 20.6*  WBC 9.9  PLT 137*     Recent Labs  08/11/15 0512  NA 139  K 4.8  BUN 16  CREATININE 0.66  GLUCOSE 244*    No results for input(s): LABPT, INR in the last 72 hours.   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

## 2015-08-12 LAB — TYPE AND SCREEN
ABO/RH(D): O POS
ANTIBODY SCREEN: NEGATIVE
UNIT DIVISION: 0
Unit division: 0

## 2015-08-12 LAB — CBC
HEMATOCRIT: 28.4 % — AB (ref 36.0–46.0)
HEMOGLOBIN: 9.6 g/dL — AB (ref 12.0–15.0)
MCH: 29.2 pg (ref 26.0–34.0)
MCHC: 33.8 g/dL (ref 30.0–36.0)
MCV: 86.3 fL (ref 78.0–100.0)
PLATELETS: 127 10*3/uL — AB (ref 150–400)
RBC: 3.29 MIL/uL — AB (ref 3.87–5.11)
RDW: 14.4 % (ref 11.5–15.5)
WBC: 13.5 10*3/uL — AB (ref 4.0–10.5)

## 2015-08-12 LAB — BASIC METABOLIC PANEL
ANION GAP: 2 — AB (ref 5–15)
BUN: 17 mg/dL (ref 6–20)
CHLORIDE: 105 mmol/L (ref 101–111)
CO2: 28 mmol/L (ref 22–32)
Calcium: 8.6 mg/dL — ABNORMAL LOW (ref 8.9–10.3)
Creatinine, Ser: 0.8 mg/dL (ref 0.44–1.00)
GFR calc Af Amer: >60 mL/min (ref >60)
GFR calc non Af Amer: >60 mL/min (ref >60)
GLUCOSE: 338 mg/dL — AB (ref 65–99)
POTASSIUM: 5.3 mmol/L — AB (ref 3.5–5.1)
Sodium: 135 mmol/L (ref 135–145)

## 2015-08-12 LAB — GLUCOSE, CAPILLARY
GLUCOSE-CAPILLARY: 338 mg/dL — AB (ref 65–99)
GLUCOSE-CAPILLARY: 295 mg/dL — AB (ref 65–99)
GLUCOSE-CAPILLARY: 232 mg/dL — AB (ref 65–99)
Glucose-Capillary: 247 mg/dL — ABNORMAL HIGH (ref 65–99)

## 2015-08-12 NOTE — Progress Notes (Addendum)
     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 Other than N&V she has no other events. Still vomiting, which happened to her after her previous surgery.  She and her husband mention that it happened for a bout 7-10 and then she cleared up.  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 Continued treatment with iron and will observe.     [place] P[place] Yianna Tersigni   PAC  08/12/2015, 9:43 AM

## 2015-08-12 NOTE — Progress Notes (Signed)
OT Cancellation Note  Patient Details Name: Cassandra Joyce MRN: [id number] DOB: 30-Jun-1945   Cancelled Treatment:    Reason Eval/Treat Not Completed: Other (comment).  Spoke to husband and pt.  They have followed posterior precautions before and understanding PWB.  He assisted with adl this am and verbalizes understanding of bathroom transfers, DME and AE. Will sign off.  Maghan Jessee 08/12/2015, 2:05 PM  Lesle Chris, OTR/L [phone] 08/12/2015

## 2015-08-12 NOTE — Progress Notes (Signed)
Physical Therapy Treatment Patient Details Name: Kimila W [name] MRN: [id number] DOB: [birth year] Today's Date: 08/12/2015    History of Present Illness 70 y.o. female admitted for hip hemiarthroplasty (March 2017) to posterior THA conversion. PMH: DM, HTN    PT Comments    POD # 2 Am session withheld due to nausea/vomiting then requested to rest. PM session, assisted pt OOB to [facility] then amb a greater distance in hallway.  Performed THR TE's then applied ICE.  Pt vomited after breakfast and lunch.    Follow Up Recommendations  Home health PT     Equipment Recommendations  None recommended by PT    Recommendations for Other Services       Precautions / Restrictions Precautions Precautions: Posterior Hip Precaution Booklet Issued: Yes (comment) Precaution Comments: instructed on THP and 50% WBing Restrictions Weight Bearing Restrictions: Yes RLE Weight Bearing: Partial weight bearing RLE Partial Weight Bearing Percentage or Pounds: 50%    Mobility  Bed Mobility Overal bed mobility: Needs Assistance Bed Mobility: Supine to Sit;Sit to Supine     Supine to sit: Min assist Sit to supine: Min assist   General bed mobility comments: min A for RLE and increased time   Transfers Overall transfer level: Needs assistance Equipment used: Rolling walker (2 wheeled) Transfers: Sit to/from Stand Sit to Stand: Min assist Stand pivot transfers: Min assist       General transfer comment: 25% VC's on proper tech and THP/WBing.  Assisted from recliner to [facility] then from [facility] to standing to amb  Ambulation/Gait Ambulation/Gait assistance: Min assist;Min guard Ambulation Distance (Feet): 84 Feet Assistive device: Rolling walker (2 wheeled) Gait Pattern/deviations: Step-to pattern;Decreased stance time - right Gait velocity: decreased   General Gait Details: tolerated increased distance and less c/o fatigue.  25% VC's on PWB and safety with turns.    Stairs             Wheelchair Mobility    Modified Rankin (Stroke Patients Only)       Balance                                    Cognition Arousal/Alertness: Awake/alert Behavior During Therapy: WFL for tasks assessed/performed Overall Cognitive Status: Within Functional Limits for tasks assessed                      Exercises      General Comments        Pertinent Vitals/Pain Pain Assessment: 0-10 Pain Score: 3  Pain Location: R hip Pain Descriptors / Indicators: Tender Pain Intervention(s): Monitored during session;Repositioned    Home Living                      Prior Function            PT Goals (current goals can now be found in the care plan section) Progress towards PT goals: Progressing toward goals    Frequency  7X/week    PT Plan Current plan remains appropriate    Co-evaluation             End of Session Equipment Utilized During Treatment: Gait belt Activity Tolerance: Patient limited by fatigue Patient left: in bed     Time: [id number] PT Time Calculation (min) (ACUTE ONLY): 26 min  Charges:  $Gait Training: 8-22 mins $Therapeutic Activity: 8-22 mins  G Codes:      Rica Koyanagi  PTA [facility]  Acute  Rehab Pager      [phone]

## 2015-08-12 NOTE — Progress Notes (Signed)
Inpatient Diabetes Program Recommendations  AACE/ADA: New Consensus Statement on Inpatient Glycemic Control (2015)  Target Ranges:  Prepandial:   less than 140 mg/dL      Peak postprandial:   less than 180 mg/dL (1-2 hours)      Critically ill patients:  140 - 180 mg/dL   Lab Results  Component Value Date   GLUCAP 295 (H) 08/12/2015   HGBA1C 7.2 (H) 08/03/2015  Results for [name], Cassandra Joyce (MRN [id number]) as of 08/12/2015 16:13  Ref. Range 08/11/2015 12:28 08/11/2015 17:20 08/11/2015 21:06 08/12/2015 07:25 08/12/2015 11:55  Glucose-Capillary Latest Ref Range: 65 - 99 mg/dL 339 (H) 304 (H) 293 (H) 338 (H) 295 (H)   Blood sugars running high. Pt states she checks her blood sugars more frequently at home. Poor intake with nausea. Husband to get soup from [employer]. Pt agrees to CBG checks Q4H since poor intake.   RN received order from PA to change CBG checks to Q4H while poor po intake.  Will follow. Thank you. Lorenda Peck, RD, LDN, CDE Inpatient Diabetes Coordinator [phone]

## 2015-08-13 LAB — GLUCOSE, CAPILLARY
GLUCOSE-CAPILLARY: 102 mg/dL — AB (ref 65–99)
GLUCOSE-CAPILLARY: 253 mg/dL — AB (ref 65–99)
GLUCOSE-CAPILLARY: 184 mg/dL — AB (ref 65–99)
GLUCOSE-CAPILLARY: 270 mg/dL — AB (ref 65–99)
Glucose-Capillary: 91 mg/dL (ref 65–99)
Glucose-Capillary: 81 mg/dL (ref 65–99)
Glucose-Capillary: 143 mg/dL — ABNORMAL HIGH (ref 65–99)

## 2015-08-13 MED ORDER — ONDANSETRON HCL 4 MG PO TABS: 4.0000 mg | ORAL_TABLET | Freq: Three times a day (TID) | ORAL | 0 refills | Status: DC | PRN

## 2015-08-13 NOTE — Progress Notes (Signed)
Physical Therapy Treatment Patient Details Name: Cassandra Joyce MRN: [id number] DOB: Apr 19, 1945 Today's Date: 08/13/2015    History of Present Illness 70 y.o. female admitted for hip hemiarthroplasty (March 2017) to posterior THA conversion. PMH: DM, HTN    PT Comments    POD #3 am session   Pt feeling better but still "delicate".  Had spouse "hands on" assist pt OOB to amb in hallway and practice stairs.  Pt used R rail and L crutch to achieve PWB.  Pt plans to D/C to home  Follow Up Recommendations  Home health PT     Equipment Recommendations  None recommended by PT    Recommendations for Other Services       Precautions / Restrictions Precautions Precautions: Posterior Hip Precaution Comments: instructed on THP and 50% WBing Restrictions Weight Bearing Restrictions: Yes RLE Weight Bearing: Partial weight bearing RLE Partial Weight Bearing Percentage or Pounds: 50%    Mobility  Bed Mobility Overal bed mobility: Needs Assistance Bed Mobility: Supine to Sit     Supine to sit: Min guard     General bed mobility comments: increased time  Transfers Overall transfer level: Needs assistance Equipment used: Rolling walker (2 wheeled) Transfers: Sit to/from Stand Sit to Stand: Min guard         General transfer comment: had spouse "hands on" assist pt under direction of therapist re safe handling and THP  Ambulation/Gait Ambulation/Gait assistance: Min guard Ambulation Distance (Feet): 75 Feet Assistive device: Rolling walker (2 wheeled) Gait Pattern/deviations: Step-to pattern;Decreased stance time - right Gait velocity: decreased   General Gait Details: had spouse "hands on" assist under direction of therapist with instruction on proper walker to self distance with turns and THP/PWB step to gait.   Stairs Stairs: Yes Stairs assistance: Min assist Stair Management: One rail Right;Step to pattern;With crutches Number of Stairs: 4 General stair comments:  25% VC's on proper sequencing and use of crutch.  Had spouse "hands on" assist under direction of therapist.    Wheelchair Mobility    Modified Rankin (Stroke Patients Only)       Balance                                    Cognition Arousal/Alertness: Awake/alert Behavior During Therapy: WFL for tasks assessed/performed Overall Cognitive Status: Within Functional Limits for tasks assessed                      Exercises      General Comments        Pertinent Vitals/Pain Pain Assessment: 0-10 Pain Score: 2  Pain Location: R hip Pain Descriptors / Indicators: Burning Pain Intervention(s): Monitored during session;Repositioned    Home Living                      Prior Function            PT Goals (current goals can now be found in the care plan section) Progress towards PT goals: Progressing toward goals    Frequency  7X/week    PT Plan Current plan remains appropriate    Co-evaluation             End of Session Equipment Utilized During Treatment: Gait belt Activity Tolerance: Patient tolerated treatment well Patient left: in chair     Time: [id number] PT Time Calculation (min) (ACUTE ONLY): 29 min  Charges:  $Gait Training: 8-22 mins $Therapeutic Activity: 8-22 mins                    G Codes:      Rica Koyanagi  PTA [facility]  Acute  Rehab Pager      [phone]

## 2015-08-13 NOTE — Progress Notes (Signed)
     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  Worked with PT yesterday for only 1 session, do to N&V.  She hasn't had any vomiting this morning, but also hasn't had but 1-2 bites of food.  She does state that after the last surgery she had difficulties with eating and vomiting for close to 10 days.  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 Follow up with OLIN,Azie Mcconahy D in 2 weeks.  Contact information:  [facility] [address], [address] [place] Marlee Armenteros   PAC  08/13/2015, 8:07 AM

## 2015-08-13 NOTE — Progress Notes (Signed)
Physical Therapy Treatment Patient Details Name: Cassandra Joyce MRN: [id number] DOB: 27-Jun-1945 Today's Date: 08/13/2015    History of Present Illness 70 y.o. female admitted for hip hemiarthroplasty (March 2017) to posterior THA conversion. PMH: DM, HTN    PT Comments    POD # 3 pm session Pt progressing with mobility and is performing at a safe level to D/C to home with spouse care. Assisted OOB to bathroom then amb a greater distance in hallway,  Performed THR TE's with min pain.  Follow Up Recommendations  Home health PT     Equipment Recommendations  None recommended by PT    Recommendations for Other Services       Precautions / Restrictions Precautions Precautions: Posterior Hip Precaution Comments: instructed on THP and 50% WBing Restrictions Weight Bearing Restrictions: Yes RLE Weight Bearing: Partial weight bearing RLE Partial Weight Bearing Percentage or Pounds: 50%    Mobility  Bed Mobility Overal bed mobility: Needs Assistance Bed Mobility: Supine to Sit     Supine to sit: Min guard     General bed mobility comments: increased time  Transfers Overall transfer level: Needs assistance Equipment used: Rolling walker (2 wheeled) Transfers: Sit to/from Stand Sit to Stand: Min guard         General transfer comment: had spouse "hands on" assist pt under direction of therapist re safe handling and THP  Ambulation/Gait Ambulation/Gait assistance: Min guard Ambulation Distance (Feet): 124 Feet Assistive device: Rolling walker (2 wheeled) Gait Pattern/deviations: Step-to pattern;Decreased stance time - right Gait velocity: decreased   General Gait Details: had spouse "hands on" assist under direction of therapist with instruction on proper walker to self distance with turns and THP/PWB step to gait.    Wheelchair Mobility    Modified Rankin (Stroke Patients Only)       Balance                                    Cognition  Arousal/Alertness: Awake/alert Behavior During Therapy: WFL for tasks assessed/performed Overall Cognitive Status: Within Functional Limits for tasks assessed                      Exercises   Total Hip Replacement TE's 10 reps ankle pumps 10 reps knee presses 10 reps heel slides 10 reps SAQ's 10 reps ABD Followed by ICE     General Comments        Pertinent Vitals/Pain Pain Assessment: 0-10 Pain Score: 2  Pain Location: R hip Pain Descriptors / Indicators: Burning Pain Intervention(s): Monitored during session;Repositioned    Home Living                      Prior Function            PT Goals (current goals can now be found in the care plan section) Progress towards PT goals: Progressing toward goals    Frequency  7X/week    PT Plan Current plan remains appropriate    Co-evaluation             End of Session Equipment Utilized During Treatment: Gait belt Activity Tolerance: Patient tolerated treatment well Patient left: in chair     Time: 1353-1434 PT Time Calculation (min) (ACUTE ONLY): 41 min  Charges:  $Gait Training: 8-22 mins $Therapeutic Exercise: 8-22 mins $Therapeutic Activity: 8-22 mins  G Codes:      Rica Koyanagi  PTA [facility]  Acute  Rehab Pager      [phone]

## 2015-08-14 LAB — GLUCOSE, CAPILLARY
GLUCOSE-CAPILLARY: 183 mg/dL — AB (ref 65–99)
GLUCOSE-CAPILLARY: 156 mg/dL — AB (ref 65–99)
Glucose-Capillary: 195 mg/dL — ABNORMAL HIGH (ref 65–99)

## 2015-08-14 MED ORDER — ASPIRIN 81 MG PO CHEW: 81.0000 mg | CHEWABLE_TABLET | Freq: Two times a day (BID) | ORAL | 0 refills | Status: AC

## 2015-08-14 MED ORDER — TRAMADOL HCL 50 MG PO TABS
50.0000 mg | ORAL_TABLET | Freq: Four times a day (QID) | ORAL | Status: DC | PRN
  Administered 2015-08-14: 50 mg via ORAL
  Filled 2015-08-14: qty 1

## 2015-08-14 MED ORDER — TRAMADOL HCL 50 MG PO TABS: 50.0000 mg | ORAL_TABLET | Freq: Four times a day (QID) | ORAL | 0 refills | Status: DC | PRN

## 2015-08-14 MED ORDER — ACETAMINOPHEN 500 MG PO TABS: 1000.0000 mg | ORAL_TABLET | Freq: Three times a day (TID) | ORAL | 0 refills | Status: AC

## 2015-08-14 NOTE — Progress Notes (Signed)
     Subjective: 4 Days Post-Op Procedure(s) (LRB): CONVERSION RIGHT HIP HEMI TO TOTAL HIP ARTHROPLASTY (Right)   Patient reports pain as mild, pain controlled. No events throughout the night.  No recent N/V. Happy that her leg lengths feel the same.  Ready to be discharged home.   Objective:   VITALS:   Vitals:   08/13/15 2130 08/14/15 0528  BP: (!) 132/54 (!) 149/58  Pulse: 74 73  Resp: 18 18  Temp: 99.3 F (37.4 C) 98.9 F (37.2 C)    Dorsiflexion/Plantar flexion intact Incision: dressing C/D/I No cellulitis present Compartment soft  LABS  Recent Labs  08/12/15 0430  HGB 9.6*  HCT 28.4*  WBC 13.5*  PLT 127*     Recent Labs  08/12/15 0430  NA 135  K 5.3*  BUN 17  CREATININE 0.80  GLUCOSE 338*     Assessment/Plan: 4 Days Post-Op Procedure(s) (LRB): CONVERSION RIGHT HIP HEMI TO TOTAL HIP ARTHROPLASTY (Right) Up with therapy Discharge home with home health  Follow up in 2 weeks at [facility]. Follow up with OLIN,Chrystal Zeimet D in 2 weeks.  Contact information:  [facility] [address], [address] [place] Cassandra Joyce   PAC  08/14/2015, 7:57 AM

## 2015-08-14 NOTE — Care Management Important Message (Signed)
Important Message  Patient Details  Name: [name] MRN: [id number] Date of Birth: [birth year]   Medicare Important Message Given:  Yes    Camillo Flaming 08/14/2015, 9:09 AMImportant Message  Patient Details  Name: Cassandra Joyce MRN: [id number] Date of Birth: 03-03-1945   Medicare Important Message Given:  Yes    Camillo Flaming 08/14/2015, 9:08 AM

## 2015-08-14 NOTE — Progress Notes (Signed)
Physical Therapy Treatment Patient Details Name: Domingue W [name] MRN: [id number] DOB: April 14, 1945 Today's Date: 08/14/2015    History of Present Illness      PT Comments    POD # 4 am session   Follow Up Recommendations  Home health PT     Equipment Recommendations  None recommended by PT    Recommendations for Other Services       Precautions / Restrictions Precautions Precautions: Posterior Hip Precaution Comments: instructed on THP and 50% WBing Restrictions Weight Bearing Restrictions: Yes RLE Weight Bearing: Partial weight bearing RLE Partial Weight Bearing Percentage or Pounds: 50%    Mobility  Bed Mobility               General bed mobility comments: OOB in reclliner  Transfers Overall transfer level: Needs assistance Equipment used: Rolling walker (2 wheeled) Transfers: Sit to/from Stand Sit to Stand: Supervision Stand pivot transfers: Supervision       General transfer comment: had spouse "hands on" assist pt under direction of therapist re safe handling and THP  Ambulation/Gait Ambulation/Gait assistance: Supervision Ambulation Distance (Feet): 75 Feet Assistive device: Rolling walker (2 wheeled)   Gait velocity: decreased   General Gait Details: had spouse "hands on" assist under direction of therapist with instruction on proper walker to self distance with turns and THP/PWB step to gait.   Stairs Stairs: Yes Stairs assistance: Min guard Stair Management: One rail Right;Step to pattern;Forwards Number of Stairs: 4 General stair comments: 25% VC's on proper sequencing and use of crutch.  Had spouse "hands on" assist under direction of therapist.    Wheelchair Mobility    Modified Rankin (Stroke Patients Only)       Balance                                    Cognition                            Exercises      General Comments        Pertinent Vitals/Pain Pain Assessment: No/denies pain     Home Living                      Prior Function            PT Goals (current goals can now be found in the care plan section) Progress towards PT goals: Progressing toward goals    Frequency  7X/week    PT Plan Current plan remains appropriate    Co-evaluation             End of Session Equipment Utilized During Treatment: Gait belt Activity Tolerance: Patient tolerated treatment well Patient left: in chair     Time: [id number] PT Time Calculation (min) (ACUTE ONLY): 39 min  Charges:  $Gait Training: 8-22 mins $Therapeutic Exercise: 8-22 mins $Therapeutic Activity: 8-22 mins                    G Codes:      Rica Koyanagi  PTA [facility]  Acute  Rehab Pager      [phone]

## 2015-08-17 NOTE — Discharge Summary (Signed)
Physician Discharge Summary  Patient ID: Cassandra Joyce MRN: [id number] DOB/AGE: 72-20-1947 72 y.o.  Admit date: 08/10/2015 Discharge date: 08/14/2015   Procedures:  Procedure(s) (LRB): CONVERSION RIGHT HIP HEMI TO TOTAL HIP ARTHROPLASTY (Right)  Attending Physician:  Dr. 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 Diabetes mellitus type 1 (HCC)    Medtronic Medimed Insulin pump  . Essential (primary) hypertension   . GERD (gastroesophageal reflux disease)    Barret's esphagus  . Hyperlipidemia   . Hypertension   . Impaired hearing    left ear hearing loss, tinnitis  . Osteoarthritis    right hip, back  . Seasonal allergies     HPI:    Cassandra Joyce, 70 y.o. 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 The indications for the revision total hip arthroplasty are bearing surface wear leading to  symptomatic synovitis. Onset of symptoms was abrupt starting 03/26/2015 with rapidlly worsening course since that time.  Prior procedures on the right hip include hemi-arthroplasty. Patient currently rates pain in the right hip at 5 out of 10 with activity.  There is worsening of pain with activity and weight bearing, trendelenberg gait, pain that interfers with activities of daily living, pain with passive range of motion and complaints of leg length discrepency. Patient has evidence of previous right hip hemiarthroplasty by imaging studies.  This condition presents safety issues increasing the risk of falls.  There is no current active infection.   Risks, benefits and  expectations were discussed with the patient.  Risks including but not limited to the risk of anesthesia, blood clots, nerve damage, blood vessel damage, failure of the prosthesis, infection and up to and including death.  Patient understand the risks, benefits and expectations and wishes to proceed with surgery.   PCP: Leonel Ramsay, MD   Discharged Condition: good  Hospital Course:  Patient underwent the above stated procedure on 08/10/2015. Patient tolerated the procedure well and brought to the recovery room in good condition and subsequently to the floor.  POD #1 BP: 115/52 ; Pulse: 70 ; Temp: 97.8 F (36.6 C) ; Resp: 18 Patient reports pain as mild to moderate.  Up in bed eating breakfast.  Husband in room.  No events, no major complaints (despite hgb of 7). Neurovascular intact and incision: dressing C/D/I  LABS  Basename    HGB     7.0  HCT     20.6   POD #2  BP: 111/51 ; Pulse: 69 ; Temp: 97.5 F (36.4 C) ; Resp: 16 Patient reports pain as mild, pain controlled. Other than N&V she has no other events. Still vomiting, which happened to her after her previous surgery.  She and her husband mention that it happened for a bout 7-10 and then she cleared up.  Did receive 2 units of blood to do symptomatic anemia. Dorsiflexion/plantar flexion intact, incision: dressing C/D/I, no cellulitis present and compartment soft.   LABS  Basename    HGB     9.6  HCT     28.4   POD #3  BP: 101/41 ;  Pulse: 69 ; Temp: 98.2 F (36.8 C) ; Resp: 16 Patient reports pain as mild with regards to the right hip.  Worked with PT yesterday for only 1 session, do to N&V.  She hasn't had any vomiting this morning, but also hasn't had but 1-2 bites of food.  She does state that after the last surgery she had difficulties with eating and vomiting for close to 10 days. Dorsiflexion/plantar flexion intact, incision: dressing C/D/I, no cellulitis present and compartment soft.   LABS  Basename    HGB     9.6   HCT     28.4    POD #4  BP: 149/58 ; Pulse: 73 ; Temp: 98.9 F (37.2 C) ; Resp: 18 Patient reports pain as mild, pain controlled. No events throughout the night.  No recent N/V. Happy that her leg lengths feel the same.  Ready to be discharged home. Dorsiflexion/plantar flexion intact, incision: dressing C/D/I, no cellulitis present and compartment soft.   LABS   No new labs   Discharge Exam: General appearance: alert, cooperative and no distress Extremities: Homans sign is negative, no sign of DVT, no edema, redness or tenderness in the calves or thighs and no ulcers, gangrene or trophic changes  Disposition: Home with follow up in 2 weeks   Follow-up [place] .   Why:  Your home health agency is now called [facility] and will provide physical therapy Contact information: [address] [place] [address] [phone]        Mauri Pole, MD. Schedule an appointment as soon as possible for a visit in 2 week(s).   Specialty:  Orthopedic Surgery Contact information: [address] [place] [address] [id number]           Discharge Instructions    Call MD / Call 911    Complete by:  As directed   If you experience chest pain or shortness of breath, CALL 911 and be transported to the hospital emergency room.  If you develope a fever above 101 F, pus (white drainage) or increased drainage or redness at the wound, or calf pain, call your surgeon's office.   Change dressing    Complete by:  As directed   Maintain surgical dressing until follow up in the clinic. If the edges start to pull up, may reinforce with tape. If the dressing is no longer working, may remove and cover with gauze and tape, but must keep the area dry and clean.  Call with any questions or concerns.   Constipation Prevention    Complete by:  As directed   Drink plenty of fluids.  Prune juice may be helpful.  You may use a stool  softener, such as Colace (over the counter) 100 mg twice a day.  Use MiraLax (over the counter) for constipation as needed.   Diet - low sodium heart healthy    Complete by:  As directed   Discharge instructions    Complete by:  As directed   Maintain surgical dressing until follow up in the clinic. If the edges start to pull up, may reinforce with tape. If the dressing is no longer working, may remove and cover with gauze and tape, but must keep the area dry and clean.  Follow up in 2 weeks at [facility]. Call with any questions or concerns.   Partial weight bearing    Complete by:  As directed   %  Body Weight:  50   Laterality:  right   Extremity:  Lower   TED hose    Complete by:  As directed   Use stockings (TED hose) for 2 weeks on both leg(s).  You may remove them at night for sleeping.        Medication List    STOP taking these medications   aspirin 81 MG tablet Replaced by:  aspirin 81 MG chewable tablet     TAKE these medications   acetaminophen 500 MG tablet Commonly known as:  TYLENOL Take 2 tablets (1,000 mg total) by mouth every 8 (eight) hours.   aspirin 81 MG chewable tablet Chew 1 tablet (81 mg total) by mouth 2 (two) times daily. Take for 4 weeks, then resume regular dose. Replaces:  aspirin 81 MG tablet   cholecalciferol 400 units Tabs tablet Commonly known as:  VITAMIN D Take 1,000 Units by mouth daily.   docusate sodium 100 MG capsule Commonly known as:  COLACE Take 1 capsule (100 mg total) by mouth 2 (two) times daily.   enalapril 2.5 MG tablet Commonly known as:  VASOTEC Take 2.5 mg by mouth.   ferrous sulfate 325 (65 FE) MG tablet Take 1 tablet (325 mg total) by mouth 3 (three) times daily after meals.   fluticasone 50 MCG/ACT nasal spray Commonly known as:  FLONASE Place 2 sprays into both nostrils daily.   hydrOXYzine 25 MG tablet Commonly known as:  ATARAX/VISTARIL Take 25 mg by mouth 3 (three) times daily as needed for  itching.   insulin lispro 100 UNIT/ML injection Commonly known as:  HUMALOG Inject 30 Units into the skin once. Pt uses insulin pump-uses 100 units every 3 days   omeprazole 40 MG capsule Commonly known as:  PRILOSEC Take 40 mg by mouth daily.   ondansetron 4 MG tablet Commonly known as:  ZOFRAN Take 1 tablet (4 mg total) by mouth every 8 (eight) hours as needed for nausea or vomiting.   polyethylene glycol packet Commonly known as:  MIRALAX / GLYCOLAX Take 17 g by mouth 2 (two) times daily.   simvastatin 40 MG tablet Commonly known as:  ZOCOR Take 40 mg by mouth daily.   tiZANidine 4 MG tablet Commonly known as:  ZANAFLEX Take 1 tablet (4 mg total) by mouth every 6 (six) hours as needed for muscle spasms.   traMADol 50 MG tablet Commonly known as:  ULTRAM Take 1-2 tablets (50-100 mg total) by mouth every 6 (six) hours as needed for moderate pain or severe pain.        Signed: [place] Pugh. Carmelle Bamberg   PA-C  08/17/2015, 2:38 PM

## 2016-05-27 ENCOUNTER — Other Ambulatory Visit: Payer: Self-pay | Admitting: Infectious Diseases

## 2016-05-27 ENCOUNTER — Ambulatory Visit
Admission: RE | Admit: 2016-05-27 | Discharge: 2016-05-27 | Disposition: A | Discharge status: Home / Self Care | Payer: Medicare Other | Source: Ambulatory Visit | Attending: Infectious Diseases | Admitting: Infectious Diseases

## 2016-05-27 DIAGNOSIS — R609 Edema, unspecified: Secondary | ICD-10-CM

## 2016-05-27 DIAGNOSIS — R600 Localized edema: Secondary | ICD-10-CM | POA: Diagnosis present

## 2016-05-27 DIAGNOSIS — I10 Essential (primary) hypertension: Secondary | ICD-10-CM

## 2016-05-27 DIAGNOSIS — E10649 Type 1 diabetes mellitus with hypoglycemia without coma: Secondary | ICD-10-CM | POA: Insufficient documentation

## 2016-05-31 ENCOUNTER — Ambulatory Visit: Payer: Medicare Other

## 2016-06-06 ENCOUNTER — Other Ambulatory Visit: Payer: Self-pay | Admitting: Infectious Diseases

## 2016-06-06 DIAGNOSIS — Z1231 Encounter for screening mammogram for malignant neoplasm of breast: Secondary | ICD-10-CM

## 2016-06-22 ENCOUNTER — Other Ambulatory Visit: Payer: Self-pay | Admitting: Cardiology

## 2016-06-22 DIAGNOSIS — R0609 Other forms of dyspnea: Principal | ICD-10-CM

## 2016-06-24 ENCOUNTER — Other Ambulatory Visit: Payer: Self-pay | Admitting: Cardiology

## 2016-06-24 DIAGNOSIS — R0609 Other forms of dyspnea: Principal | ICD-10-CM

## 2016-06-24 DIAGNOSIS — R600 Localized edema: Secondary | ICD-10-CM

## 2016-06-28 ENCOUNTER — Ambulatory Visit
Admission: RE | Admit: 2016-06-28 | Discharge: 2016-06-28 | Disposition: A | Discharge status: Home / Self Care | Payer: Medicare Other | Source: Ambulatory Visit | Attending: Cardiology | Admitting: Cardiology

## 2016-06-28 DIAGNOSIS — R600 Localized edema: Secondary | ICD-10-CM | POA: Diagnosis present

## 2016-06-28 DIAGNOSIS — R0609 Other forms of dyspnea: Secondary | ICD-10-CM | POA: Insufficient documentation

## 2016-06-28 DIAGNOSIS — R911 Solitary pulmonary nodule: Secondary | ICD-10-CM | POA: Insufficient documentation

## 2016-06-28 HISTORY — DX: Malignant (primary) neoplasm, unspecified: C80.1

## 2016-06-28 MED ORDER — IOPAMIDOL (ISOVUE-370) INJECTION 76%
100.0000 mL | Freq: Once | INTRAVENOUS | Status: AC | PRN
  Administered 2016-06-28: 100 mL via INTRAVENOUS

## 2016-07-18 ENCOUNTER — Ambulatory Visit
Admission: RE | Admit: 2016-07-18 | Discharge: 2016-07-18 | Disposition: A | Discharge status: Home / Self Care | Payer: Medicare Other | Source: Ambulatory Visit | Attending: Infectious Diseases | Admitting: Infectious Diseases

## 2016-07-18 DIAGNOSIS — Z1231 Encounter for screening mammogram for malignant neoplasm of breast: Secondary | ICD-10-CM | POA: Insufficient documentation

## 2017-06-13 ENCOUNTER — Other Ambulatory Visit: Payer: Self-pay | Admitting: Infectious Diseases

## 2017-06-13 DIAGNOSIS — Z1231 Encounter for screening mammogram for malignant neoplasm of breast: Secondary | ICD-10-CM

## 2017-07-19 ENCOUNTER — Ambulatory Visit
Admission: RE | Admit: 2017-07-19 | Discharge: 2017-07-19 | Disposition: A | Discharge status: Home / Self Care | Payer: Medicare Other | Source: Ambulatory Visit | Attending: Infectious Diseases | Admitting: Infectious Diseases

## 2017-07-19 DIAGNOSIS — Z1231 Encounter for screening mammogram for malignant neoplasm of breast: Secondary | ICD-10-CM

## 2017-08-25 ENCOUNTER — Encounter: Payer: Self-pay | Admitting: *Deleted

## 2017-08-28 ENCOUNTER — Ambulatory Visit: Payer: Medicare Other | Admitting: Anesthesiology

## 2017-08-28 ENCOUNTER — Ambulatory Visit
Admission: RE | Admit: 2017-08-28 | Discharge: 2017-08-28 | Disposition: A | Discharge status: Home / Self Care | Payer: Medicare Other | Source: Ambulatory Visit | Attending: Unknown Physician Specialty | Admitting: Unknown Physician Specialty

## 2017-08-28 ENCOUNTER — Encounter: Payer: Self-pay | Admitting: *Deleted

## 2017-08-28 ENCOUNTER — Encounter
Admission: RE | Disposition: A | Discharge status: Home / Self Care | Payer: Self-pay | Source: Ambulatory Visit | Attending: Unknown Physician Specialty

## 2017-08-28 DIAGNOSIS — I10 Essential (primary) hypertension: Secondary | ICD-10-CM | POA: Insufficient documentation

## 2017-08-28 DIAGNOSIS — K210 Gastro-esophageal reflux disease with esophagitis: Secondary | ICD-10-CM | POA: Diagnosis not present

## 2017-08-28 DIAGNOSIS — Z8719 Personal history of other diseases of the digestive system: Secondary | ICD-10-CM | POA: Diagnosis not present

## 2017-08-28 DIAGNOSIS — Z7951 Long term (current) use of inhaled steroids: Secondary | ICD-10-CM | POA: Insufficient documentation

## 2017-08-28 DIAGNOSIS — E109 Type 1 diabetes mellitus without complications: Secondary | ICD-10-CM | POA: Insufficient documentation

## 2017-08-28 DIAGNOSIS — Z794 Long term (current) use of insulin: Secondary | ICD-10-CM | POA: Diagnosis not present

## 2017-08-28 DIAGNOSIS — Z09 Encounter for follow-up examination after completed treatment for conditions other than malignant neoplasm: Secondary | ICD-10-CM | POA: Insufficient documentation

## 2017-08-28 DIAGNOSIS — Z96641 Presence of right artificial hip joint: Secondary | ICD-10-CM | POA: Insufficient documentation

## 2017-08-28 DIAGNOSIS — Z85828 Personal history of other malignant neoplasm of skin: Secondary | ICD-10-CM | POA: Insufficient documentation

## 2017-08-28 DIAGNOSIS — E785 Hyperlipidemia, unspecified: Secondary | ICD-10-CM | POA: Insufficient documentation

## 2017-08-28 DIAGNOSIS — Z9641 Presence of insulin pump (external) (internal): Secondary | ICD-10-CM | POA: Insufficient documentation

## 2017-08-28 DIAGNOSIS — Z79899 Other long term (current) drug therapy: Secondary | ICD-10-CM | POA: Insufficient documentation

## 2017-08-28 HISTORY — DX: Nasal polyp, unspecified: J33.9

## 2017-08-28 HISTORY — DX: Unspecified dyspareunia: N94.10

## 2017-08-28 HISTORY — DX: Hematuria, unspecified: R31.9

## 2017-08-28 HISTORY — PX: ESOPHAGOGASTRODUODENOSCOPY (EGD) WITH PROPOFOL: SHX5813

## 2017-08-28 HISTORY — DX: Asymptomatic menopausal state: Z78.0

## 2017-08-28 HISTORY — DX: Microscopic colitis, unspecified: K52.839

## 2017-08-28 HISTORY — DX: Chronic sinusitis, unspecified: J32.9

## 2017-08-28 HISTORY — DX: Barrett's esophagus without dysplasia: K22.70

## 2017-08-28 LAB — GLUCOSE, CAPILLARY: Glucose-Capillary: 264 mg/dL — ABNORMAL HIGH (ref 70–99)

## 2017-08-28 SURGERY — ESOPHAGOGASTRODUODENOSCOPY (EGD) WITH PROPOFOL
Anesthesia: General

## 2017-08-28 MED ORDER — ONDANSETRON HCL 4 MG/2ML IJ SOLN
INTRAMUSCULAR | Status: AC
  Filled 2017-08-28: qty 2

## 2017-08-28 MED ORDER — SODIUM CHLORIDE 0.9 % IV SOLN
INTRAVENOUS | Status: DC
  Administered 2017-08-28: 1000 mL via INTRAVENOUS

## 2017-08-28 MED ORDER — LIDOCAINE HCL (CARDIAC) PF 100 MG/5ML IV SOSY
PREFILLED_SYRINGE | INTRAVENOUS | Status: DC | PRN
  Administered 2017-08-28: 30 mg via INTRAVENOUS

## 2017-08-28 MED ORDER — LIDOCAINE HCL (PF) 2 % IJ SOLN
INTRAMUSCULAR | Status: AC
  Filled 2017-08-28: qty 10

## 2017-08-28 MED ORDER — PROPOFOL 500 MG/50ML IV EMUL
INTRAVENOUS | Status: AC
  Filled 2017-08-28: qty 50

## 2017-08-28 MED ORDER — FENTANYL CITRATE (PF) 100 MCG/2ML IJ SOLN: INTRAMUSCULAR | Status: DC | PRN

## 2017-08-28 MED ORDER — VANCOMYCIN HCL IN DEXTROSE 750-5 MG/150ML-% IV SOLN
750.0000 mg | Freq: Once | INTRAVENOUS | Status: AC
  Administered 2017-08-28: 750 mg via INTRAVENOUS
  Filled 2017-08-28: qty 150

## 2017-08-28 MED ORDER — SODIUM CHLORIDE 0.9 % IV SOLN: INTRAVENOUS | Status: DC

## 2017-08-28 MED ORDER — BUTAMBEN-TETRACAINE-BENZOCAINE 2-2-14 % EX AERO
INHALATION_SPRAY | CUTANEOUS | Status: DC | PRN
  Administered 2017-08-28: 2 via TOPICAL

## 2017-08-28 MED ORDER — GENTAMICIN IN SALINE 1.6-0.9 MG/ML-% IV SOLN
80.0000 mg | Freq: Once | INTRAVENOUS | Status: AC
  Administered 2017-08-28: 80 mg via INTRAVENOUS
  Filled 2017-08-28: qty 50

## 2017-08-28 MED ORDER — FENTANYL CITRATE (PF) 100 MCG/2ML IJ SOLN
INTRAMUSCULAR | Status: AC
  Filled 2017-08-28: qty 2

## 2017-08-28 MED ORDER — PROPOFOL 500 MG/50ML IV EMUL
INTRAVENOUS | Status: DC | PRN
  Administered 2017-08-28: 100 ug/kg/min via INTRAVENOUS

## 2017-08-28 NOTE — Anesthesia Preprocedure Evaluation (Signed)
Anesthesia Evaluation  Patient identified by MRN, date of birth, ID band Patient awake    Reviewed: Allergy & Precautions, NPO status , Patient's Chart, lab work & pertinent test results  History of Anesthesia Complications Negative for: history of anesthetic complications  Airway Mallampati: III       Dental   Pulmonary neg sleep apnea, neg COPD,           Cardiovascular hypertension, Pt. on medications (-) Past MI and (-) CHF (-) dysrhythmias (-) Valvular Problems/Murmurs     Neuro/Psych neg Seizures    GI/Hepatic Neg liver ROS, GERD  Medicated,  Endo/Other  diabetes, Type 1, Insulin Dependent  Renal/GU negative Renal ROS     Musculoskeletal   Abdominal   Peds  Hematology   Anesthesia Other Findings   Reproductive/Obstetrics                             Anesthesia Physical Anesthesia Plan  ASA: III  Anesthesia Plan: General   Post-op Pain Management:    Induction: Intravenous  PONV Risk Score and Plan: 3  Airway Management Planned: Nasal Cannula  Additional Equipment:   Intra-op Plan:   Post-operative Plan:   Informed Consent: I have reviewed the patients History and Physical, chart, labs and discussed the procedure including the risks, benefits and alternatives for the proposed anesthesia with the patient or authorized representative who has indicated his/her understanding and acceptance.     Plan Discussed with:   Anesthesia Plan Comments:         Anesthesia Quick Evaluation

## 2017-08-28 NOTE — Transfer of Care (Signed)
Immediate Anesthesia Transfer of Care Note  Patient: Cassandra Joyce  Procedure(s) Performed: ESOPHAGOGASTRODUODENOSCOPY (EGD) WITH PROPOFOL (N/A )  Patient Location: PACU  Anesthesia Type:General  Level of Consciousness: awake and oriented  Airway & Oxygen Therapy: Patient Spontanous Breathing and Patient connected to nasal cannula oxygen  Post-op Assessment: Report given to RN and Post -op Vital signs reviewed and stable  Post vital signs: Reviewed and stable  Last Vitals:  Vitals Value Taken Time  BP 108/51 08/28/2017 10:08 AM  Temp    Pulse 56 08/28/2017 10:08 AM  Resp 16 08/28/2017 10:08 AM  SpO2 100 % 08/28/2017 10:08 AM  Vitals shown include unvalidated device data.  Last Pain:  Vitals:   08/28/17 0751  TempSrc: Tympanic  PainSc: 0-No pain         Complications: No apparent anesthesia complications

## 2017-08-28 NOTE — Anesthesia Post-op Follow-up Note (Signed)
Anesthesia QCDR form completed.        

## 2017-08-28 NOTE — Anesthesia Procedure Notes (Signed)
Performed by: Cook[place]Martin, [place] Pre-anesthesia Checklist: Patient identified, Emergency Drugs available, Suction available, Patient being monitored and Timeout performed Patient Re-evaluated:Patient Re-evaluated prior to induction Oxygen Delivery Method: Nasal cannula Preoxygenation: Pre-oxygenation with 100% oxygen Induction Type: IV induction Airway Equipment and Method: Bite block Placement Confirmation: CO2 detector and positive ETCO2       

## 2017-08-28 NOTE — H&P (Signed)
Primary Care Physician:  Baxter Hire, MD Primary Gastroenterologist:  Dr. Vira Agar  Pre-Procedure History & Physical: HPI:  Cassandra Joyce is a 72 y.o. female is here for an endoscopy.  Done for history of Barretts esophagus.   Past Medical History:  Diagnosis Date  . Barrett's esophagus   . Cancer ([place])    skin  . Diabetes mellitus type 1 (HCC)    Medtronic Medimed Insulin pump  . Essential (primary) hypertension   . Female dyspareunia   . GERD (gastroesophageal reflux disease)    Barret's esphagus  . Hematuria   . Hyperlipidemia   . Hypertension   . Impaired hearing    left ear hearing loss, tinnitis  . Menopause   . Microscopic colitis   . Nasal polyps   . Osteoarthritis    right hip, back  . Seasonal allergies   . Sinusitis     Past Surgical History:  Procedure Laterality Date  . CATARACT EXTRACTION     1 eye, other eye not done yet  . COLONOSCOPY    . CONVERSION TO TOTAL HIP Right 08/10/2015   Procedure: CONVERSION RIGHT HIP HEMI TO TOTAL HIP ARTHROPLASTY;  Surgeon: Paralee Cancel, MD;  Location: [facility] ORS;  Service: Orthopedics;  Laterality: Right;  . ESOPHAGOGASTRODUODENOSCOPY N/A 07/11/2014   Procedure: ESOPHAGOGASTRODUODENOSCOPY (EGD);  Surgeon: Manya Silvas, MD;  Location[facility] ENDOSCOPY;  Service: Endoscopy;  Laterality: N/A;  . HERNIA REPAIR Left    inguinal '73  . HIP ARTHROPLASTY Right 03/26/2015   Procedure: ARTHROPLASTY BIPOLAR HIP (HEMIARTHROPLASTY);  Surgeon: Earnestine Leys, MD;  Location: [facility] ORS;  Service: Orthopedics;  Laterality: Right;  . TONSILLECTOMY    . TUBAL LIGATION      Prior to Admission medications   Medication Sig Start Date End Date Taking? Authorizing Provider  calcium carbonate (CALTRATE 600) 1500 (600 Ca) MG TABS tablet Take by mouth 2 (two) times daily with a meal.   Yes [provider]  hydrochlorothiazide (HYDRODIURIL) 12.5 MG tablet Take 12.5 mg by mouth daily.   Yes [provider]  acetaminophen  (TYLENOL) 500 MG tablet Take 2 tablets (1,000 mg total) by mouth every 8 (eight) hours. 08/14/15   Danae Orleans, PA-C  enalapril (VASOTEC) 2.5 MG tablet Take 2.5 mg by mouth.    [provider]  fluticasone (FLONASE) 50 MCG/ACT nasal spray Place 2 sprays into both nostrils daily.    [provider]  insulin lispro (HUMALOG) 100 UNIT/ML injection Inject 30 Units into the skin once. Pt uses insulin pump-uses 100 units every 3 days    [provider]  omeprazole (PRILOSEC) 40 MG capsule Take 40 mg by mouth daily.    [provider]  polyethylene glycol (MIRALAX / GLYCOLAX) packet Take 17 g by mouth 2 (two) times daily. 08/11/15   Danae Orleans, PA-C  simvastatin (ZOCOR) 40 MG tablet Take 40 mg by mouth daily.    [provider]    Allergies as of 06/20/2017 - Review Complete 08/10/2015  Allergen Reaction Noted  . Augmentin [amoxicillin-pot clavulanate]  07/10/2014  . Mercury Swelling 07/10/2014  . Penicillin g Nausea Only 07/10/2014  . Sulfa antibiotics  07/10/2014    Family History  Problem Relation Age of Onset  . Diabetes Mellitus II Mother   . Diabetes Mellitus II Sister   . Breast cancer Neg Hx     Social History   Socioeconomic History  . Marital status: Married    Spouse name: Not on file  .  Number of children: Not on file  . Years of education: Not on file  . Highest education level: Not on file  Occupational History  . Not on file  Social Needs  . Financial resource strain: Not on file  . Food insecurity:    Worry: Not on file    Inability: Not on file  . Transportation needs:    Medical: Not on file    Non-medical: Not on file  Tobacco Use  . Smoking status: Never Smoker  . Smokeless tobacco: Never Used  Substance and Sexual Activity  . Alcohol use: Yes    Comment: rare  . Drug use: No  . Sexual activity: Not on file  Lifestyle  . Physical activity:    Days per week: Not on file    Minutes per session: Not on  file  . Stress: Not on file  Relationships  . Social connections:    Talks on phone: Not on file    Gets together: Not on file    Attends religious service: Not on file    Active member of club or organization: Not on file    Attends meetings of clubs or organizations: Not on file    Relationship status: Not on file  . Intimate partner violence:    Fear of current or ex partner: Not on file    Emotionally abused: Not on file    Physically abused: Not on file    Forced sexual activity: Not on file  Other Topics Concern  . Not on file  Social History Narrative  . Not on file    Review of Systems: See HPI, otherwise negative ROS  Physical Exam: BP (!) 156/67   Pulse 66   Temp (!) 97 F (36.1 C) (Tympanic)   Resp 16   Ht 5\' 5"  (1.651 m)   Wt 61.2 kg   SpO2 100%   BMI 22.47 kg/m  General:   Alert,  pleasant and cooperative in NAD Head:  Normocephalic and atraumatic. Neck:  Supple; no masses or thyromegaly. Lungs:  Clear throughout to auscultation.    Heart:  Regular rate and rhythm. Abdomen:  Soft, nontender and nondistended. Normal bowel sounds, without guarding, and without rebound.   Neurologic:  Alert and  oriented x4;  grossly normal neurologically.  Impression/Plan: Cassandra Joyce is here for an endoscopy to be performed for [facility] Barretts esophagus.  Risks, benefits, limitations, and alternatives regarding  endoscopy have been reviewed with the patient.  Questions have been answered.  All parties agreeable.   Gaylyn Cheers, MD  08/28/2017, 8:19 AM

## 2017-08-28 NOTE — Anesthesia Postprocedure Evaluation (Signed)
Anesthesia Post Note  Patient: Cassandra Joyce  Procedure(s) Performed: ESOPHAGOGASTRODUODENOSCOPY (EGD) WITH PROPOFOL (N/A )  Patient location during evaluation: Endoscopy Anesthesia Type: General Level of consciousness: awake and alert Pain management: pain level controlled Vital Signs Assessment: post-procedure vital signs reviewed and stable Respiratory status: spontaneous breathing and respiratory function stable Cardiovascular status: stable Anesthetic complications: no     Last Vitals:  Vitals:   08/28/17 0751 08/28/17 1007  BP: (!) 156/67 (!) 108/51  Pulse: 66 (!) 57  Resp: 16 17  Temp: (!) 36.1 C 36.4 C  SpO2: 100% 100%    Last Pain:  Vitals:   08/28/17 1007  TempSrc: Tympanic  PainSc: 0-No pain                 KEPHART,WILLIAM K

## 2017-08-28 NOTE — Op Note (Signed)
[facility] Gastroenterology Patient Name: Cassandra Joyce Procedure Date: 08/28/2017 9:48 AM MRN: [id number] Account #: [id number] Date of Birth: 1945/09/03 Admit Type: Outpatient Age: 72 Room: [facility] ENDO ROOM 3 Gender: Female Note Status: Finalized Procedure:            Upper GI endoscopy Indications:          Follow-up of Barrett's esophagus Providers:            Manya Silvas, MD Referring MD:         Andres Labrum, MD (Referring MD) Medicines:            Propofol per Anesthesia Complications:        No immediate complications. Procedure:            Pre-Anesthesia Assessment:                       - After reviewing the risks and benefits, the patient                        was deemed in satisfactory condition to undergo the                        procedure.                       After obtaining informed consent, the endoscope was                        passed under direct vision. Throughout the procedure,                        the patient's blood pressure, pulse, and oxygen                        saturations were monitored continuously. The Endoscope                        was introduced through the mouth, and advanced to the                        second part of duodenum. The upper GI endoscopy was                        accomplished without difficulty. The patient tolerated                        the procedure well. Findings:      There were esophageal mucosal changes consistent with short-segment       Barrett's esophagus present in the lower third of the esophagus. The       maximum longitudinal extent of these mucosal changes was 2 cm in length.       Mucosa was biopsied with a cold forceps for histology. One specimen       bottle was sent to pathology. Four biopsies done of distal esophagus to       evaluate the mucosa.      The stomach was normal.      The examined duodenum was normal. Impression:           - Esophageal mucosal changes consistent  with  short-segment Barrett's esophagus. Biopsied.                       - Normal stomach.                       - Normal examined duodenum. Recommendation:       - Await pathology results. Manya Silvas, MD 08/28/2017 10:06:40 AM This report has been signed electronically. Number of Addenda: 0 Note Initiated On: 08/28/2017 9:48 AM      [facility]

## 2017-08-29 ENCOUNTER — Encounter: Payer: Self-pay | Admitting: Unknown Physician Specialty

## 2017-08-29 LAB — SURGICAL PATHOLOGY

## 2018-01-07 IMAGING — MG MM DIGITAL SCREENING BILAT W/ TOMO W/ CAD
9 of 14 series · 9 of 30 positions shown · non-contrast
Comparison: Previous exam(s).

CLINICAL DATA: Screening.

EXAM:
2D DIGITAL SCREENING BILATERAL MAMMOGRAM WITH CAD AND ADJUNCT TOMO

[R MLO (1 of 2)]
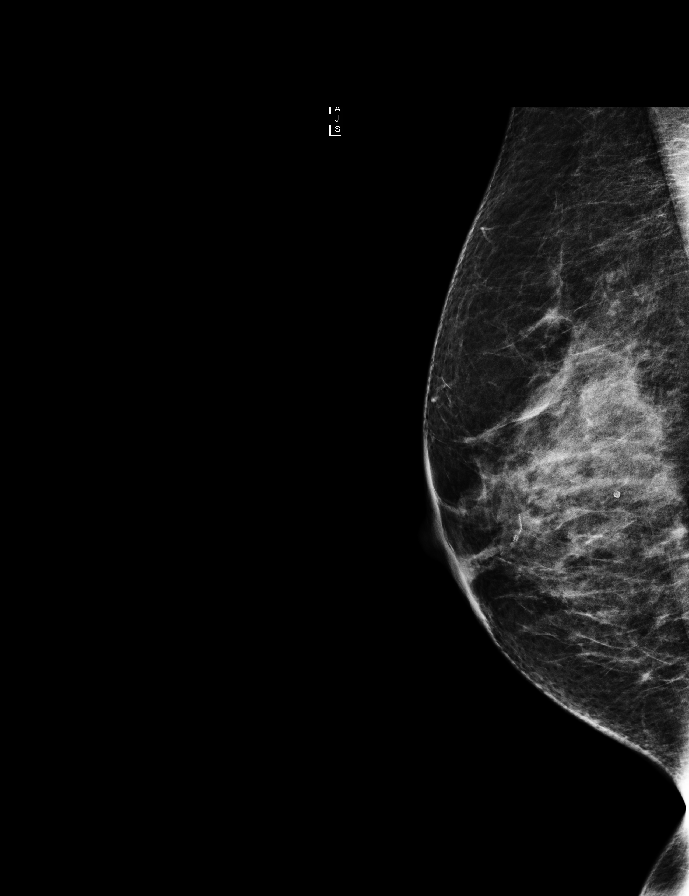

[L MLO (1 of 2)]
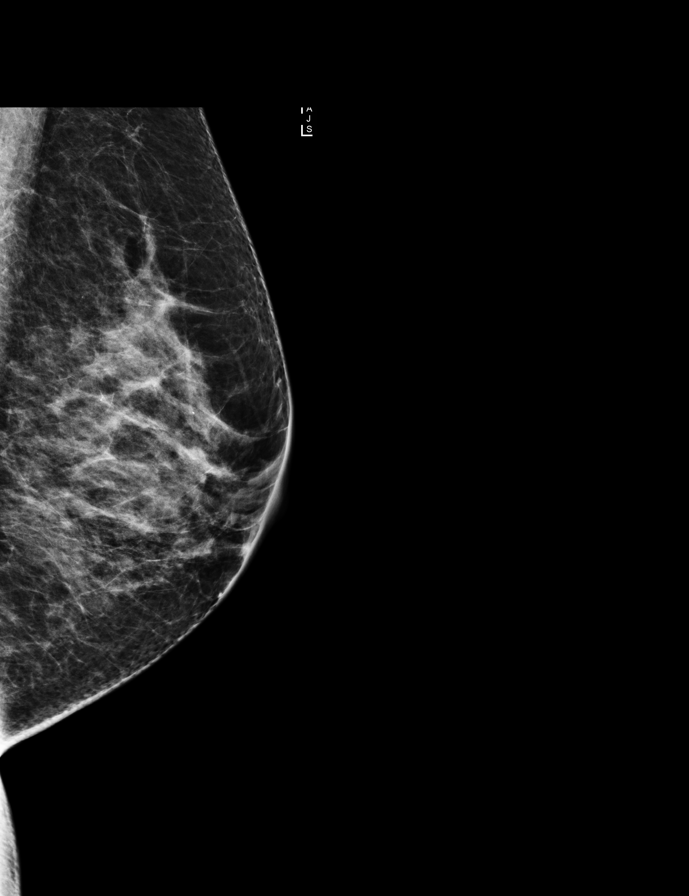

[L MLO (2 of 2)]
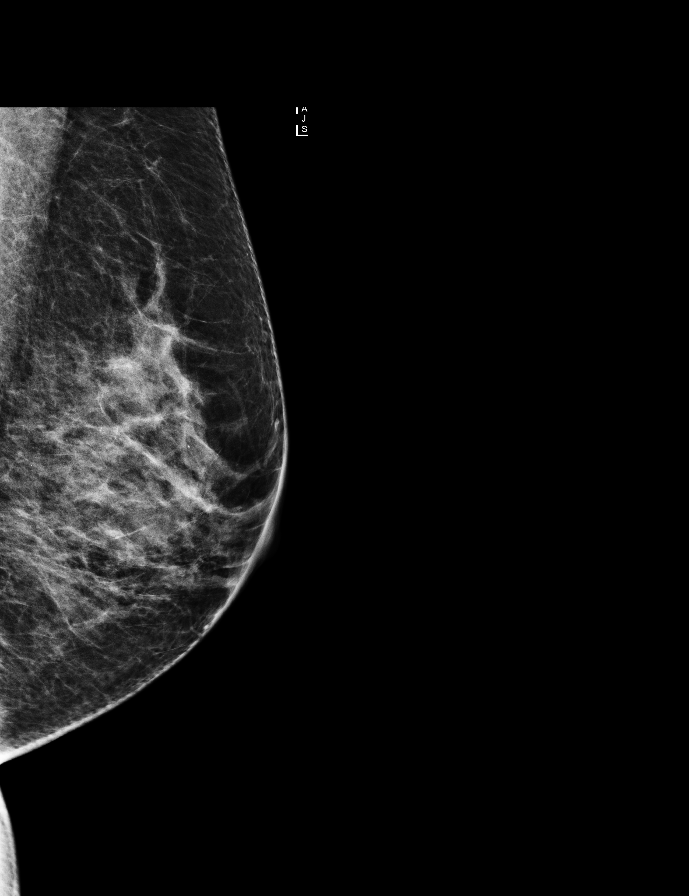

[R MLO (2 of 2)]
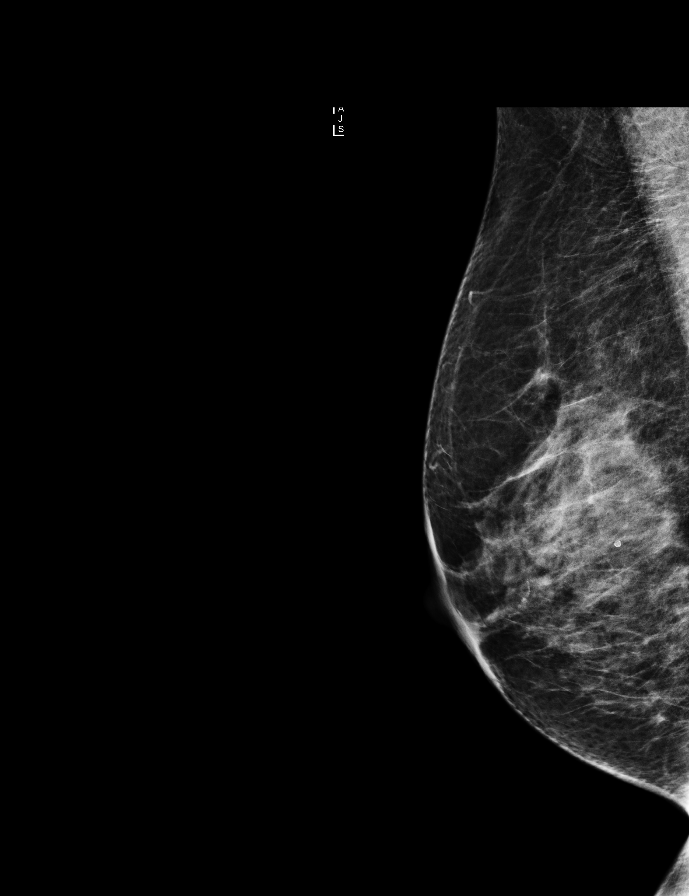

[R CC]
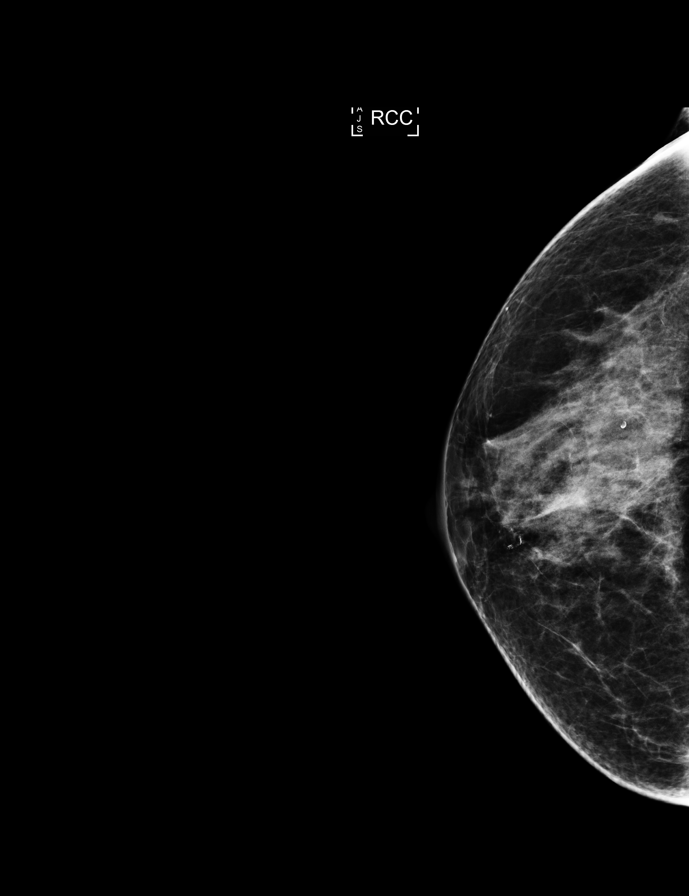

[L MLO synth-2D]
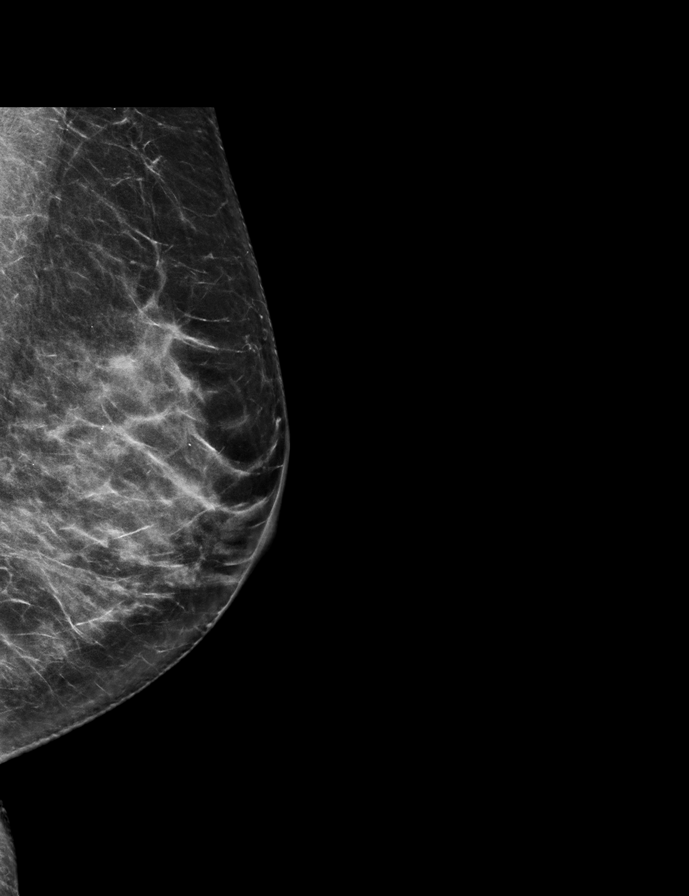

[L CC]
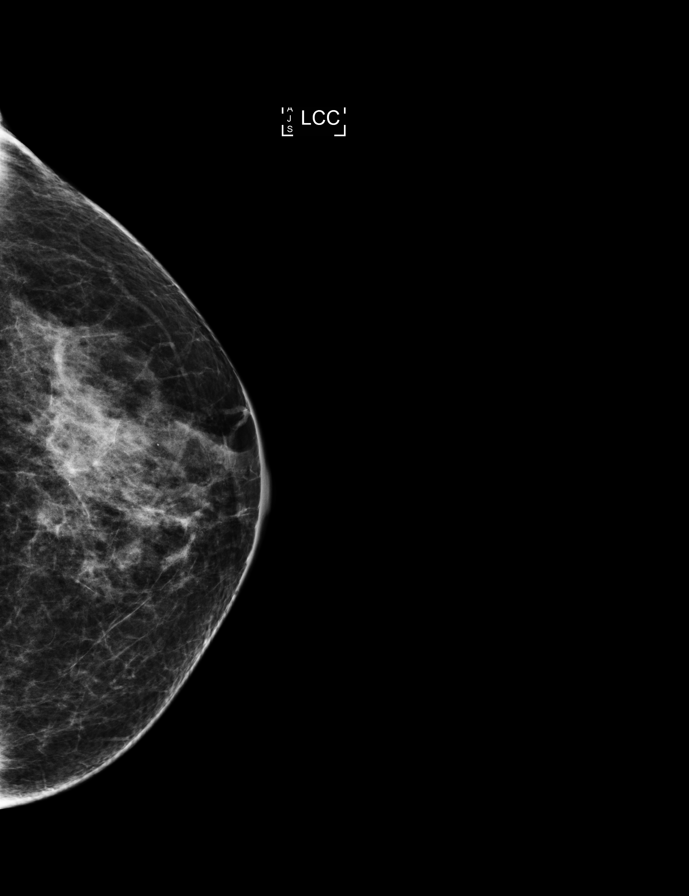

[R MLO synth-2D]
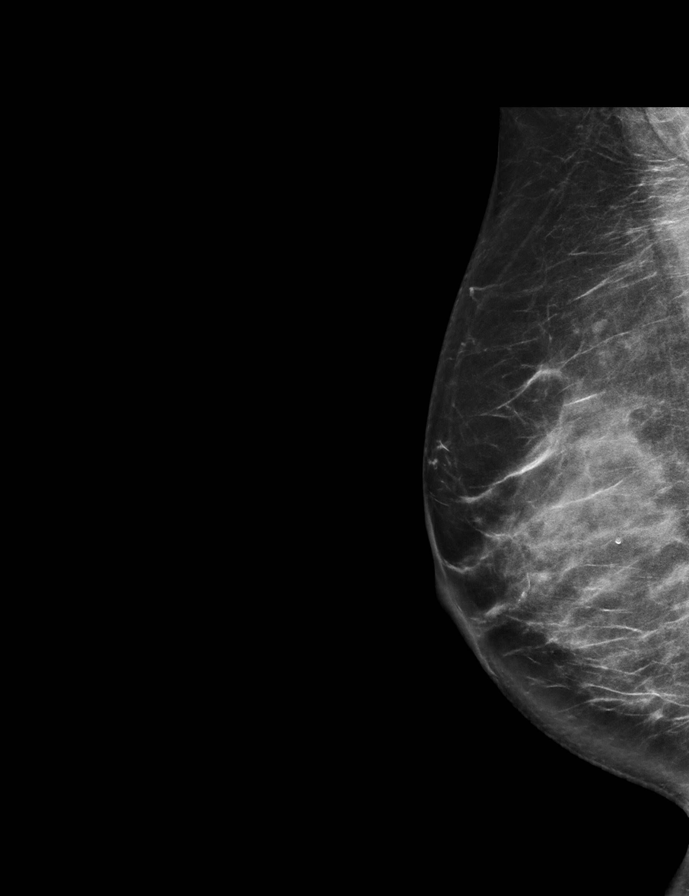

[L CC synth-2D]
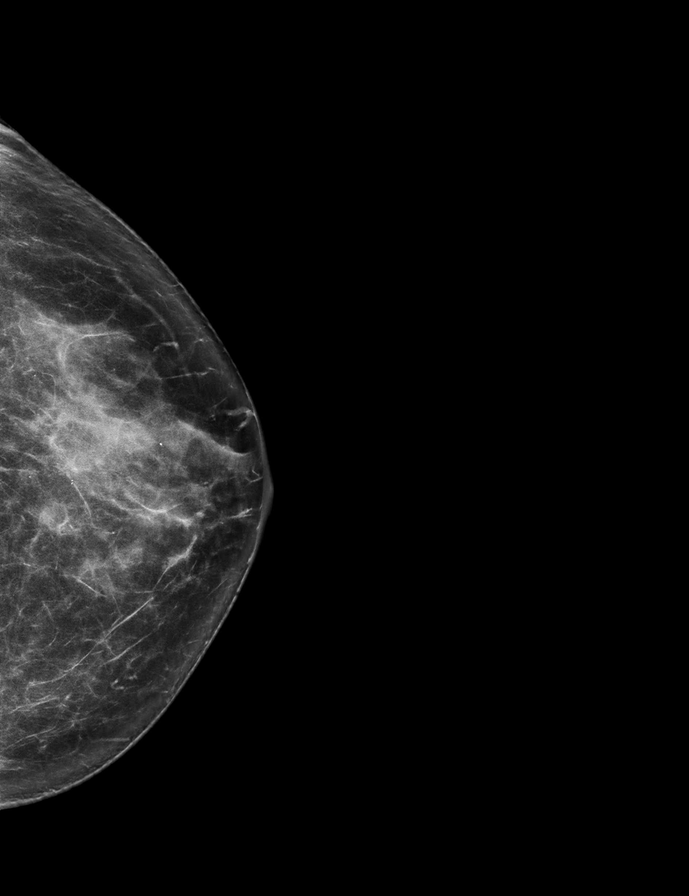

[9 of 30 positions shown; findings below may reference images not displayed]

ACR Breast Density Category c: The breast tissue is heterogeneously
dense, which may obscure small masses.
FINDINGS: There are no findings suspicious for malignancy. Images were
processed with CAD.
IMPRESSION: No mammographic evidence of malignancy. A result letter of this
screening mammogram will be mailed directly to the patient.

RECOMMENDATION:
Screening mammogram in one year. (Code:TN-[id number])

BI-RADS CATEGORY  1: Negative.

## 2018-06-18 ENCOUNTER — Other Ambulatory Visit: Payer: Self-pay | Admitting: Internal Medicine

## 2018-06-18 DIAGNOSIS — Z1231 Encounter for screening mammogram for malignant neoplasm of breast: Secondary | ICD-10-CM

## 2018-07-27 ENCOUNTER — Ambulatory Visit
Admission: RE | Admit: 2018-07-27 | Discharge: 2018-07-27 | Disposition: A | Discharge status: Home / Self Care | Payer: Medicare Other | Source: Ambulatory Visit | Attending: Internal Medicine | Admitting: Internal Medicine

## 2018-07-27 DIAGNOSIS — Z1231 Encounter for screening mammogram for malignant neoplasm of breast: Secondary | ICD-10-CM | POA: Insufficient documentation

## 2019-07-01 ENCOUNTER — Other Ambulatory Visit: Payer: Self-pay | Admitting: Internal Medicine

## 2019-07-01 ENCOUNTER — Other Ambulatory Visit: Payer: Self-pay | Admitting: Infectious Diseases

## 2019-07-01 DIAGNOSIS — Z1231 Encounter for screening mammogram for malignant neoplasm of breast: Secondary | ICD-10-CM

## 2019-07-02 ENCOUNTER — Other Ambulatory Visit: Payer: Self-pay

## 2019-07-02 ENCOUNTER — Ambulatory Visit (INDEPENDENT_AMBULATORY_CARE_PROVIDER_SITE_OTHER): Payer: Medicare Other | Admitting: Dermatology

## 2019-07-02 DIAGNOSIS — L609 Nail disorder, unspecified: Secondary | ICD-10-CM

## 2019-07-02 DIAGNOSIS — Z1283 Encounter for screening for malignant neoplasm of skin: Secondary | ICD-10-CM | POA: Diagnosis not present

## 2019-07-02 DIAGNOSIS — I8393 Asymptomatic varicose veins of bilateral lower extremities: Secondary | ICD-10-CM

## 2019-07-02 DIAGNOSIS — L814 Other melanin hyperpigmentation: Secondary | ICD-10-CM

## 2019-07-02 DIAGNOSIS — L578 Other skin changes due to chronic exposure to nonionizing radiation: Secondary | ICD-10-CM

## 2019-07-02 DIAGNOSIS — L820 Inflamed seborrheic keratosis: Secondary | ICD-10-CM | POA: Diagnosis not present

## 2019-07-02 DIAGNOSIS — Z85828 Personal history of other malignant neoplasm of skin: Secondary | ICD-10-CM

## 2019-07-02 DIAGNOSIS — D2371 Other benign neoplasm of skin of right lower limb, including hip: Secondary | ICD-10-CM | POA: Diagnosis not present

## 2019-07-02 DIAGNOSIS — D239 Other benign neoplasm of skin, unspecified: Secondary | ICD-10-CM

## 2019-07-02 DIAGNOSIS — L821 Other seborrheic keratosis: Secondary | ICD-10-CM

## 2019-07-02 NOTE — Progress Notes (Signed)
   Follow-Up Visit   Subjective  Cassandra Joyce is a 74 y.o. female who presents for the following: Annual Exam.  Patient here today for TBSE. She has a history of BCC. There is a spot on her right forearm that she picks at and does not go away. She is not sure how long it's been there. No other new or changing spots patient is aware of.   The following portions of the chart were reviewed this encounter and updated as appropriate:      Review of Systems:  No other skin or systemic complaints except as noted in HPI or Assessment and Plan.  Objective  Well appearing patient in no apparent distress; mood and affect are within normal limits.  A full examination was performed including scalp, head, eyes, ears, nose, lips, neck, chest, axillae, abdomen, back, buttocks, bilateral upper extremities, bilateral lower extremities, hands, feet, fingers, toes, fingernails, and toenails. All findings within normal limits unless otherwise noted below.  Objective  Right Forearm x 1, Left spinal upper back x 1 (2): Erythematous keratotic or waxy stuck-on papule or plaque.   Objective  Right Upper Arm, right posterior ankle: Firm pink/brown papulenodule with dimple sign.   Objective  lower legs: Varices and spider veins  Objective  Right great toe nail: Dystrophy with thickening  Objective  Left Root of Nose, right forehead: clear    Assessment & Plan  Inflamed seborrheic keratosis (2) Right Forearm x 1, Left spinal upper back x 1  Destruction of lesion - Right Forearm x 1, Left spinal upper back x 1  Destruction method: cryotherapy   Informed consent: discussed and consent obtained   Lesion destroyed using liquid nitrogen: Yes   Region frozen until ice ball extended beyond lesion: Yes   Outcome: patient tolerated procedure well with no complications   Post-procedure details: wound care instructions given    Dermatofibroma Right Upper Arm, right posterior ankle  Benign,  observe.    Spider veins of both lower extremities lower legs  Benign, observe.    Nail problem Right great toe nail  Related to chronic trauma from shoe vs tinea. Pt trying to change shoes.  Observe for now.  History of basal cell carcinoma (BCC) Left Root of Nose, right forehead  Clear. Observe for recurrence. Call clinic for new or changing lesions.  Recommend regular skin exams, daily broad-spectrum spf 30+ sunscreen use, and photoprotection.      Lentigines - Scattered tan macules - Discussed due to sun exposure - Benign, observe - Call for any changes  Seborrheic Keratoses - Stuck-on, waxy, tan-brown papules and plaques  - Discussed benign etiology and prognosis. - Observe - Call for any changes  Actinic Damage - diffuse scaly erythematous macules with underlying dyspigmentation - Recommend daily broad spectrum sunscreen SPF 30+ to sun-exposed areas, reapply every 2 hours as needed.  - Call for new or changing lesions.  Skin cancer screening performed today.  Return in about 1 year (around 07/01/2020) for TBSE.  Graciella Belton, RMA, am acting as scribe for Brendolyn Patty, MD .  Documentation: I have reviewed the above documentation for accuracy and completeness, and I agree with the above.  Brendolyn Patty MD

## 2019-07-02 NOTE — Patient Instructions (Addendum)
Recommend daily broad spectrum sunscreen SPF 30+ to sun-exposed areas, reapply every 2 hours as needed. Call for new or changing lesions.  Cryotherapy Aftercare  . Wash gently with soap and water everyday.   Marland Kitchen Apply Vaseline and Band-Aid daily until healed.  Seborrheic Keratosis  What causes seborrheic keratoses? Seborrheic keratoses are harmless, common skin growths that first appear during adult life.  As time goes by, more growths appear.  Some people may develop a large number of them.  Seborrheic keratoses appear on both covered and uncovered body parts.  They are not caused by sunlight.  The tendency to develop seborrheic keratoses can be inherited.  They vary in color from skin-colored to gray, brown, or even black.  They can be either smooth or have a rough, warty surface.   Seborrheic keratoses are superficial and look as if they were stuck on the skin.  Under the microscope this type of keratosis looks like layers upon layers of skin.  That is why at times the top layer may seem to fall off, but the rest of the growth remains and re-grows.    Treatment Seborrheic keratoses do not need to be treated, but can easily be removed in the office.  Seborrheic keratoses often cause symptoms when they rub on clothing or jewelry.  Lesions can be in the way of shaving.  If they become inflamed, they can cause itching, soreness, or burning.  Removal of a seborrheic keratosis can be accomplished by freezing, burning, or surgery. If any spot bleeds, scabs, or grows rapidly, please return to have it checked, as these can be an indication of a skin cancer.

## 2019-07-29 ENCOUNTER — Ambulatory Visit
Admission: RE | Admit: 2019-07-29 | Discharge: 2019-07-29 | Disposition: A | Discharge status: Home / Self Care | Payer: Medicare Other | Source: Ambulatory Visit | Attending: Infectious Diseases | Admitting: Infectious Diseases

## 2019-07-29 DIAGNOSIS — Z1231 Encounter for screening mammogram for malignant neoplasm of breast: Secondary | ICD-10-CM | POA: Insufficient documentation

## 2020-06-29 ENCOUNTER — Other Ambulatory Visit: Payer: Self-pay | Admitting: Infectious Diseases

## 2020-06-29 DIAGNOSIS — Z1231 Encounter for screening mammogram for malignant neoplasm of breast: Secondary | ICD-10-CM

## 2020-07-21 ENCOUNTER — Encounter: Payer: Medicare Other | Admitting: Dermatology

## 2020-07-29 ENCOUNTER — Other Ambulatory Visit: Payer: Self-pay

## 2020-07-29 ENCOUNTER — Ambulatory Visit
Admission: RE | Admit: 2020-07-29 | Discharge: 2020-07-29 | Disposition: A | Discharge status: Home / Self Care | Payer: Medicare Other | Source: Ambulatory Visit | Attending: Infectious Diseases | Admitting: Infectious Diseases

## 2020-07-29 DIAGNOSIS — Z1231 Encounter for screening mammogram for malignant neoplasm of breast: Secondary | ICD-10-CM

## 2020-08-05 ENCOUNTER — Other Ambulatory Visit: Payer: Self-pay | Admitting: Otolaryngology

## 2020-08-05 DIAGNOSIS — M542 Cervicalgia: Secondary | ICD-10-CM

## 2020-08-19 ENCOUNTER — Other Ambulatory Visit: Payer: Self-pay

## 2020-08-19 ENCOUNTER — Ambulatory Visit
Admission: RE | Admit: 2020-08-19 | Discharge: 2020-08-19 | Disposition: A | Discharge status: Home / Self Care | Payer: Medicare Other | Source: Ambulatory Visit | Attending: Otolaryngology | Admitting: Otolaryngology

## 2020-08-19 DIAGNOSIS — M542 Cervicalgia: Secondary | ICD-10-CM | POA: Diagnosis present

## 2020-09-28 ENCOUNTER — Other Ambulatory Visit: Payer: Self-pay

## 2020-09-28 ENCOUNTER — Ambulatory Visit (INDEPENDENT_AMBULATORY_CARE_PROVIDER_SITE_OTHER): Payer: Medicare Other | Admitting: Dermatology

## 2020-09-28 DIAGNOSIS — L814 Other melanin hyperpigmentation: Secondary | ICD-10-CM

## 2020-09-28 DIAGNOSIS — L820 Inflamed seborrheic keratosis: Secondary | ICD-10-CM

## 2020-09-28 DIAGNOSIS — L578 Other skin changes due to chronic exposure to nonionizing radiation: Secondary | ICD-10-CM | POA: Diagnosis not present

## 2020-09-28 DIAGNOSIS — Z85828 Personal history of other malignant neoplasm of skin: Secondary | ICD-10-CM | POA: Diagnosis not present

## 2020-09-28 DIAGNOSIS — L821 Other seborrheic keratosis: Secondary | ICD-10-CM

## 2020-09-28 DIAGNOSIS — L570 Actinic keratosis: Secondary | ICD-10-CM

## 2020-09-28 DIAGNOSIS — D485 Neoplasm of uncertain behavior of skin: Secondary | ICD-10-CM

## 2020-09-28 DIAGNOSIS — Z1283 Encounter for screening for malignant neoplasm of skin: Secondary | ICD-10-CM

## 2020-09-28 DIAGNOSIS — L237 Allergic contact dermatitis due to plants, except food: Secondary | ICD-10-CM

## 2020-09-28 DIAGNOSIS — L720 Epidermal cyst: Secondary | ICD-10-CM

## 2020-09-28 DIAGNOSIS — D692 Other nonthrombocytopenic purpura: Secondary | ICD-10-CM

## 2020-09-28 DIAGNOSIS — I8310 Varicose veins of unspecified lower extremity with inflammation: Secondary | ICD-10-CM

## 2020-09-28 HISTORY — DX: Actinic keratosis: L57.0

## 2020-09-28 MED ORDER — PREDNISONE 5 MG PO TABS: ORAL_TABLET | ORAL | 0 refills | Status: DC

## 2020-09-28 NOTE — Progress Notes (Signed)
Follow-Up Visit   Subjective  Cassandra Joyce is a 75 y.o. female who presents for the following: Annual Exam.  Patient presents for TBSE. She complains of poison ivy of the arms and ankle. She got the itchy rash after working out in yard. She is using TMC 0.1% Cream and Prednisone 20mg  tablets prn (prescribed by another doctor in 2021). She has a history of BCCs. She has some irritated growths on face and scalp that she picks at.   The following portions of the chart were reviewed this encounter and updated as appropriate:       Review of Systems:  No other skin or systemic complaints except as noted in HPI or Assessment and Plan.  Objective  Well appearing patient in no apparent distress; mood and affect are within normal limits.  A full examination was performed including scalp, head, eyes, ears, nose, lips, neck, chest, axillae, abdomen, back, buttocks, bilateral upper extremities, bilateral lower extremities, hands, feet, fingers, toes, fingernails, and toenails. All findings within normal limits unless otherwise noted below.  L ankle, bilateral forearms Scaly erythematous papules and plaques +/- edema and vesiculation.   crown x 2, L temple x 1 (2) Erythematous keratotic or waxy stuck-on papule   upper sternum 4.38mm pink brown macule        Assessment & Plan  Skin cancer screening performed today.  Actinic Damage - chronic, secondary to cumulative UV radiation exposure/sun exposure over time - diffuse scaly erythematous macules with underlying dyspigmentation - Recommend daily broad spectrum sunscreen SPF 30+ to sun-exposed areas, reapply every 2 hours as needed.  - Recommend staying in the shade or wearing long sleeves, sun glasses (UVA+UVB protection) and wide brim hats (4-inch brim around the entire circumference of the hat). - Call for new or changing lesions.  Lentigines - Scattered tan macules - Due to sun exposure - Benign-appering, observe - Recommend  daily broad spectrum sunscreen SPF 30+ to sun-exposed areas, reapply every 2 hours as needed. - Call for any changes  Purpura - Chronic; persistent and recurrent.  Treatable, but not curable. - Violaceous macules and patches - Benign - Related to trauma, age, sun damage and/or use of blood thinners, chronic use of topical and/or oral steroids - Observe - Can use OTC arnica containing moisturizer such as Dermend Bruise Formula if desired - Call for worsening or other concerns  Seborrheic Keratoses - Stuck-on, waxy, tan-brown papules and/or plaques  - Benign-appearing - Discussed benign etiology and prognosis. - Observe - Call for any changes  History of Basal Cell Carcinoma of the Skin - No evidence of recurrence today of the left root of nose and right forehead - Recommend regular full body skin exams - Recommend daily broad spectrum sunscreen SPF 30+ to sun-exposed areas, reapply every 2 hours as needed.  - Call if any new or changing lesions are noted between office visits   Milia - tiny firm white papules of the bilateral paranasal - type of cyst - benign - may be extracted if symptomatic - observe  Varicose Veins/Spider Veins - Dilated blue, purple or red veins at the lower extremities - Reassured - Smaller vessels can be treated by sclerotherapy (a procedure to inject a medicine into the veins to make them disappear) if desired, but the treatment is not covered by insurance. Larger vessels may be covered if symptomatic and we would refer to vascular surgeon if treatment desired.  Allergic contact dermatitis due to plants, except food L ankle, bilateral forearms  Start Prednisone 2 week taper. Take prednisone by mouth daily in morning with food starting at 60 mg dosage and gradually decreasing daily dose as directed until gone for a two week taper.  Patient was given written instructions.  Potential side effects reviewed. Dsp #100 0Rf.  Continue TMC 0.1% Cream Apply  qd/bid AA rash until improved. Pt has.  Discussed risk of increased blood sugar since patient is diabetic.   Risks of prednisone taper discussed including mood irritability, insomnia, weight gain, stomach ulcers, increased risk of infection, increased blood sugar (diabetes), hypertension, osteoporosis with long-term or frequent use, and rare risk of avascular necrosis of the hip.   Topical steroids (such as triamcinolone, fluocinolone, fluocinonide, mometasone, clobetasol, halobetasol, betamethasone, hydrocortisone) can cause thinning and lightening of the skin if they are used for too long in the same area. Your physician has selected the right strength medicine for your problem and area affected on the body. Please use your medication only as directed by your physician to prevent side effects.    predniSONE (DELTASONE) 5 MG tablet - L ankle, bilateral forearms Take by mouth in morning for 2 week taper. Patient has detailed instruction sheet.  Inflamed seborrheic keratosis crown x 2, L temple x 1  Destruction of lesion - crown x 2, L temple x 1  Destruction method: cryotherapy   Informed consent: discussed and consent obtained   Lesion destroyed using liquid nitrogen: Yes   Region frozen until ice ball extended beyond lesion: Yes   Outcome: patient tolerated procedure well with no complications   Post-procedure details: wound care instructions given   Additional details:  Prior to procedure, discussed risks of blister formation, small wound, skin dyspigmentation, or rare scar following cryotherapy. Recommend Vaseline ointment to treated areas while healing.   Neoplasm of uncertain behavior of skin upper sternum  Skin / nail biopsy Type of biopsy: tangential   Informed consent: discussed and consent obtained   Patient was prepped and draped in usual sterile fashion: Area prepped with alcohol. Anesthesia: the lesion was anesthetized in a standard fashion   Anesthetic:  1% lidocaine  w/ epinephrine 1-100,000 buffered w/ 8.4% NaHCO3 Instrument used: flexible razor blade   Hemostasis achieved with: pressure, aluminum chloride and electrodesiccation   Outcome: patient tolerated procedure well   Post-procedure details: wound care instructions given   Post-procedure details comment:  Ointment and small bandage applied  Specimen 1 - Surgical pathology Differential Diagnosis: Lentigo vs SK r/o Atypia Check Margins: No 4.46mm pink brown macule    Return in about 1 year (around 09/28/2021) for TBSE.  Documentation: I have reviewed the above documentation for accuracy and completeness, and I agree with the above.  Brendolyn Patty MD

## 2020-09-28 NOTE — Patient Instructions (Addendum)
2 Week Prednisone Taper  You will be given a prescription for 100 tablets of oral Prednisone. It is very important that you take this according to the exact schedule provided below. This type of regimen for taking medication is often called a "taper", because your dosage will steadily decrease over a two week period until it is discontinued altogether.  ALWAYS take this medicine with food to prevent it from irritating your stomach. You should also take your Prednisone during morning hours.  Call the clinic at [phone] if you gain more than two pounds in one day, notice swelling anywhere on your body, have shortness of breath, black or red bowel movements, brown or red vomitus, desire to drink large amounts of fluids, a fever, or extreme weakness.   Oral Prednisone over Two Weeks  Day  Week 1  Week 2   1  12  tablets  7 tablets   2  12 tablets  6 tablets   3  11 tablets  5 tablets   4  10 tablets  4 tablets   5  10 tablets  3 tablets   6  9 tablets  2 tablets   7  8 tablets  1 tablet     Risks of prednisone taper discussed including mood irritability, insomnia, weight gain, stomach ulcers, increased risk of infection, increased blood sugar (diabetes), hypertension, osteoporosis with long-term or frequent use, and rare risk of avascular necrosis of the hip.   Topical steroids (such as triamcinolone, fluocinolone, fluocinonide, mometasone, clobetasol, halobetasol, betamethasone, hydrocortisone) can cause thinning and lightening of the skin if they are used for too long in the same area. Your physician has selected the right strength medicine for your problem and area affected on the body. Please use your medication only as directed by your physician to prevent side effects.    Cryotherapy Aftercare  Wash gently with soap and water everyday.   Apply Vaseline and Band-Aid daily until healed.    Wound Care Instructions  Cleanse wound gently with soap and water once a day then pat dry  with clean gauze. Apply a thing coat of Petrolatum (petroleum jelly, "Vaseline") over the wound (unless you have an allergy to this). We recommend that you use a new, sterile tube of Vaseline. Do not pick or remove scabs. Do not remove the yellow or white "healing tissue" from the base of the wound.  Cover the wound with fresh, clean, nonstick gauze and secure with paper tape. You may use Band-Aids in place of gauze and tape if the would is small enough, but would recommend trimming much of the tape off as there is often too much. Sometimes Band-Aids can irritate the skin.  You should call the office for your biopsy report after 1 week if you have not already been contacted.  If you experience any problems, such as abnormal amounts of bleeding, swelling, significant bruising, significant pain, or evidence of infection, please call the office immediately.  FOR ADULT SURGERY PATIENTS: If you need something for pain relief you may take 1 extra strength Tylenol (acetaminophen) AND 2 Ibuprofen (200mg  each) together every 4 hours as needed for pain. (do not take these if you are allergic to them or if you have a reason you should not take them.) Typically, you may only need pain medication for 1 to 3 days.    If you have any questions or concerns for your doctor, please call our main line at [phone] and press option 4 to reach your  doctor's Psychologist, sport and exercise. If no one answers, please leave a voicemail as directed and we will return your call as soon as possible. Messages left after 4 pm will be answered the following business day.   You may also send [place] a message via [place]. We typically respond to MyChart messages within 1-2 business days.  For prescription refills, please ask your pharmacy to contact our office. Our fax number is [phone].  If you have an urgent issue when the clinic is closed that cannot wait until the next business day, you can page your doctor at the number below.     Please note that while we do our best to be available for urgent issues outside of office hours, we are not available 24/7.   If you have an urgent issue and are unable to reach [place], you may choose to seek medical care at your doctor's office, retail clinic, urgent care center, or emergency room.  If you have a medical emergency, please immediately call 911 or go to the emergency department.  Pager Numbers  - Dr. Nehemiah Massed: [phone]  - Dr. Laurence Ferrari: [phone]  - Dr. Nicole Kindred: [phone]  In the event of inclement weather, please call our main line at [phone] for an update on the status of any delays or closures.  Dermatology Medication Tips: Please keep the boxes that topical medications come in in order to help keep track of the instructions about where and how to use these. Pharmacies typically print the medication instructions only on the boxes and not directly on the medication tubes.   If your medication is too expensive, please contact our office at [phone] option 4 or send [place] a message through [place].   We are unable to tell what your co-pay for medications will be in advance as this is different depending on your insurance coverage. However, we may be able to find a substitute medication at lower cost or fill out paperwork to get insurance to cover a needed medication.   If a prior authorization is required to get your medication covered by your insurance company, please allow [place] 1-2 business days to complete this process.  Drug prices often vary depending on where the prescription is filled and some pharmacies may offer cheaper prices.  The website www.goodrx.com contains coupons for medications through different pharmacies. The prices here do not account for what the cost may be with help from insurance (it may be cheaper with your insurance), but the website can give you the price if you did not use any insurance.  - You can print the associated coupon and take  it with your prescription to the pharmacy.  - You may also stop by our office during regular business hours and pick up a GoodRx coupon card.  - If you need your prescription sent electronically to a different pharmacy, notify our office through [facility] or by phone at [phone] option 4.

## 2020-10-05 ENCOUNTER — Telehealth: Payer: Self-pay

## 2020-10-05 NOTE — Telephone Encounter (Signed)
Advised pt of bx results/sh ?

## 2020-10-05 NOTE — Telephone Encounter (Signed)
-----   Message from Brendolyn Patty, MD sent at 10/02/2020  2:21 PM EDT ----- Skin , upper sternum ACTINIC KERATOSIS AND SEBORRHEIC KERATOSIS, IRRITATED  Precancer and ISK, LN2 if recurs - please call patient

## 2020-10-07 ENCOUNTER — Encounter: Payer: Self-pay | Admitting: *Deleted

## 2020-10-08 ENCOUNTER — Encounter: Payer: Self-pay | Admitting: *Deleted

## 2020-10-08 ENCOUNTER — Encounter
Admission: RE | Disposition: A | Discharge status: Home / Self Care | Payer: Self-pay | Source: Home / Self Care | Attending: Gastroenterology

## 2020-10-08 ENCOUNTER — Ambulatory Visit: Payer: Medicare Other | Admitting: Anesthesiology

## 2020-10-08 ENCOUNTER — Ambulatory Visit
Admission: RE | Admit: 2020-10-08 | Discharge: 2020-10-08 | Disposition: A | Discharge status: Home / Self Care | Payer: Medicare Other | Attending: Gastroenterology | Admitting: Gastroenterology

## 2020-10-08 DIAGNOSIS — Z881 Allergy status to other antibiotic agents status: Secondary | ICD-10-CM | POA: Insufficient documentation

## 2020-10-08 DIAGNOSIS — Z85828 Personal history of other malignant neoplasm of skin: Secondary | ICD-10-CM | POA: Diagnosis not present

## 2020-10-08 DIAGNOSIS — Z9641 Presence of insulin pump (external) (internal): Secondary | ICD-10-CM | POA: Insufficient documentation

## 2020-10-08 DIAGNOSIS — K2100 Gastro-esophageal reflux disease with esophagitis, without bleeding: Secondary | ICD-10-CM | POA: Diagnosis not present

## 2020-10-08 DIAGNOSIS — Z79899 Other long term (current) drug therapy: Secondary | ICD-10-CM | POA: Diagnosis not present

## 2020-10-08 DIAGNOSIS — Z880 Allergy status to penicillin: Secondary | ICD-10-CM | POA: Diagnosis not present

## 2020-10-08 DIAGNOSIS — Z794 Long term (current) use of insulin: Secondary | ICD-10-CM | POA: Diagnosis not present

## 2020-10-08 DIAGNOSIS — Z882 Allergy status to sulfonamides status: Secondary | ICD-10-CM | POA: Diagnosis not present

## 2020-10-08 DIAGNOSIS — K2270 Barrett's esophagus without dysplasia: Secondary | ICD-10-CM | POA: Insufficient documentation

## 2020-10-08 HISTORY — PX: ESOPHAGOGASTRODUODENOSCOPY: SHX5428

## 2020-10-08 LAB — GLUCOSE, CAPILLARY: Glucose-Capillary: 130 mg/dL — ABNORMAL HIGH (ref 70–99)

## 2020-10-08 SURGERY — EGD (ESOPHAGOGASTRODUODENOSCOPY)
Anesthesia: General

## 2020-10-08 MED ORDER — PROPOFOL 500 MG/50ML IV EMUL
INTRAVENOUS | Status: DC | PRN
  Administered 2020-10-08: 150 ug/kg/min via INTRAVENOUS

## 2020-10-08 MED ORDER — SODIUM CHLORIDE 0.9 % IV SOLN
INTRAVENOUS | Status: DC
  Administered 2020-10-08: 1000 mL via INTRAVENOUS

## 2020-10-08 MED ORDER — PROPOFOL 500 MG/50ML IV EMUL
INTRAVENOUS | Status: AC
  Filled 2020-10-08: qty 50

## 2020-10-08 MED ORDER — LIDOCAINE HCL (PF) 2 % IJ SOLN
INTRAMUSCULAR | Status: AC
  Filled 2020-10-08: qty 5

## 2020-10-08 NOTE — Op Note (Signed)
[facility] Patient Name: Cassandra Joyce Procedure Date: 10/08/2020 9:06 AM MRN: [id number] Account #: [id number] Date of Birth: [birth year] Admit Type: Outpatient Age: 76 Room: [facility] ENDO ROOM 1 Gender: Female Note Status: Finalized Instrument Name: Upper Endoscope [id number] Procedure:             Upper GI endoscopy Indications:           Surveillance for malignancy due to personal history of                         Barrett's esophagus Providers:             Rueben Bash, DO Referring MD:          Adrian Prows (Referring MD) Medicines:             Monitored Anesthesia Care Complications:         No immediate complications. Estimated blood loss:                         Minimal. Procedure:             Pre-Anesthesia Assessment:                        - Prior to the procedure, a History and Physical was                         performed, and patient medications and allergies were                         reviewed. The patient is competent. The risks and                         benefits of the procedure and the sedation options and                         risks were discussed with the patient. All questions                         were answered and informed consent was obtained.                         Patient identification and proposed procedure were                         verified by the physician, the nurse, the anesthetist                         and the technician in the endoscopy suite. Mental                         Status Examination: alert and oriented. Airway                         Examination: normal oropharyngeal airway and neck                         mobility. Respiratory Examination: clear to  auscultation. CV Examination: normal. Prophylactic                         Antibiotics: The patient does not require prophylactic                         antibiotics. Prior Anticoagulants: The patient has                          taken no previous anticoagulant or antiplatelet                         agents. ASA Grade Assessment: III - A patient with                         severe systemic disease. After reviewing the risks and                         benefits, the patient was deemed in satisfactory                         condition to undergo the procedure. The anesthesia                         plan was to use monitored anesthesia care (MAC).                         Immediately prior to administration of medications,                         the patient was re-assessed for adequacy to receive                         sedatives. The heart rate, respiratory rate, oxygen                         saturations, blood pressure, adequacy of pulmonary                         ventilation, and response to care were monitored                         throughout the procedure. The physical status of the                         patient was re-assessed after the procedure.                        After obtaining informed consent, the endoscope was                         passed under direct vision. Throughout the procedure,                         the patient's blood pressure, pulse, and oxygen                         saturations were monitored continuously. The Endoscope  was introduced through the mouth, and advanced to the                         second part of duodenum. The upper GI endoscopy was                         accomplished without difficulty. The patient tolerated                         the procedure well. Findings:      The duodenal bulb, first portion of the duodenum and second portion of       the duodenum were normal. Estimated blood loss: none.      The entire examined stomach was normal. Estimated blood loss: none.      Esophagogastric landmarks were identified: the gastroesophageal junction       was found at 38 cm from the incisors.      There were esophageal mucosal  changes secondary to established       short-segment Barrett's disease, classified as Barrett's stage C0-M2 per       [place] criteria present in the distal esophagus. The maximum       longitudinal extent of these mucosal changes was C0M2 in length. Mucosa       was biopsied with a cold forceps for histology in a targeted manner. One       specimen bottle was sent to pathology. Estimated blood loss was minimal.       No nodularity or lesion noted.      Otherwise, Normal mucosa was found in the upper third of the esophagus,       in the middle third of the esophagus and in the lower third of the       esophagus. Estimated blood loss: none. Impression:            - Normal duodenal bulb, first portion of the duodenum                         and second portion of the duodenum.                        - Normal stomach.                        - Esophagogastric landmarks identified.                        - Esophageal mucosal changes secondary to established                         short-segment Barrett's disease, classified as                         Barrett's stage C0-M2 per [place] criteria. Biopsied.                        - Normal mucosa was found in the upper third of the                         esophagus, in the middle third of the esophagus and in  the lower third of the esophagus. Recommendation:        - Discharge patient to home.                        - Resume previous diet.                        - Continue present medications.                        - Await pathology results.                        - Repeat upper endoscopy for surveillance of Barrett's                         esophagus and for surveillance based on pathology                         results.                        - Return to referring physician as previously                         scheduled. Procedure Code(s):     --- Professional ---                        [phone], Esophagogastroduodenoscopy,  flexible,                         transoral; with biopsy, single or multiple Diagnosis Code(s):     --- Professional ---                        K22.70, Barrett's esophagus without dysplasia CPT copyright 2019 American Medical Association. All rights reserved. The codes documented in this report are preliminary and upon coder review may  be revised to meet current compliance requirements. Attending Participation:      I personally performed the entire procedure. Volney American, DO Annamaria Helling DO, DO 10/08/2020 10:07:34 AM This report has been signed electronically. Number of Addenda: 0 Note Initiated On: 10/08/2020 9:06 AM Estimated Blood Loss:  Estimated blood loss was minimal.      [facility]

## 2020-10-08 NOTE — Interval H&P Note (Signed)
History and Physical Interval Note: Preprocedure H&P from 10/08/20  was reviewed and there was no interval change after seeing and examining the patient.  Written consent was obtained from the patient after discussion of risks, benefits, and alternatives. Patient has consented to proceed with Esophagogastroduodenoscopy with possible intervention   10/08/2020 9:28 AM  Cassandra Joyce  has presented today for surgery, with the diagnosis of (K21.9) GASTROESOPHAGEAL REFLUX DIESEASE  (K22.70) BARRETT'S ESOPHAGUS WITHOUT DYSPLASIA.  The various methods of treatment have been discussed with the patient and family. After consideration of risks, benefits and other options for treatment, the patient has consented to  Procedure(s): ESOPHAGOGASTRODUODENOSCOPY (EGD) (N/A) as a surgical intervention.  The patient's history has been reviewed, patient examined, no change in status, stable for surgery.  I have reviewed the patient's chart and labs.  Questions were answered to the patient's satisfaction.     Annamaria Helling

## 2020-10-08 NOTE — Anesthesia Procedure Notes (Signed)
Date/Time: 10/08/2020 9:49 AM Performed by: Vaughan Sine Pre-anesthesia Checklist: Patient identified, Emergency Drugs available, Suction available, Patient being monitored and Timeout performed Patient Re-evaluated:Patient Re-evaluated prior to induction Oxygen Delivery Method: Nasal cannula Preoxygenation: Pre-oxygenation with 100% oxygen Induction Type: IV induction Airway Equipment and Method: Bite block Placement Confirmation: CO2 detector and positive ETCO2

## 2020-10-08 NOTE — Anesthesia Postprocedure Evaluation (Signed)
Anesthesia Post Note  Patient: Cassandra Joyce  Procedure(s) Performed: ESOPHAGOGASTRODUODENOSCOPY (EGD)  Patient location during evaluation: Endoscopy Anesthesia Type: General Level of consciousness: awake and alert Pain management: pain level controlled Vital Signs Assessment: post-procedure vital signs reviewed and stable Respiratory status: spontaneous breathing, nonlabored ventilation and respiratory function stable Cardiovascular status: blood pressure returned to baseline and stable Postop Assessment: no apparent nausea or vomiting Anesthetic complications: no   No notable events documented.   Last Vitals:  Vitals:   10/08/20 1020 10/08/20 1030  BP: 133/69 (!) 196/84  Pulse: (!) 58 65  Resp: 13 20  Temp:    SpO2: 100% 100%    Last Pain:  Vitals:   10/08/20 1000  TempSrc: Temporal  PainSc:                  [place] Ouch

## 2020-10-08 NOTE — Anesthesia Preprocedure Evaluation (Addendum)
Anesthesia Evaluation  Patient identified by MRN, date of birth, ID band Patient awake    Reviewed: Allergy & Precautions, NPO status , Patient's Chart, lab work & pertinent test results  History of Anesthesia Complications Negative for: history of anesthetic complications  Airway Mallampati: II  TM Distance: >3 FB Neck ROM: Full    Dental no notable dental hx.    Pulmonary neg sleep apnea, neg COPD, Not current smoker,    Pulmonary exam normal        Cardiovascular Exercise Tolerance: Good hypertension, Pt. on medications (-) Past MI and (-) CHF Normal cardiovascular exam(-) dysrhythmias (-) Valvular Problems/Murmurs     Neuro/Psych neg Seizures Impaired hearing    GI/Hepatic Neg liver ROS, GERD  Medicated,Barrett';s esophagus   Endo/Other  diabetes (a1c 09/2020 7.2), Well Controlled, Type 1, Insulin DependentMedtronic Medimed Insulin pump  Renal/GU negative Renal ROS     Musculoskeletal  (+) Arthritis , Osteoarthritis,    Abdominal Normal abdominal exam  (+)   Peds  Hematology   Anesthesia Other Findings Insulin pump disconnected  Reproductive/Obstetrics                            Anesthesia Physical  Anesthesia Plan  ASA: III  Anesthesia Plan: General   Post-op Pain Management:    Induction: Intravenous  PONV Risk Score and Plan: 3  Airway Management Planned: Nasal Cannula and Natural Airway  Additional Equipment:   Intra-op Plan:   Post-operative Plan:   Informed Consent: I have reviewed the patients History and Physical, chart, labs and discussed the procedure including the risks, benefits and alternatives for the proposed anesthesia with the patient or authorized representative who has indicated his/her understanding and acceptance.     Dental advisory given  Plan Discussed with: CRNA and Anesthesiologist  Anesthesia Plan Comments:        Anesthesia Quick  Evaluation

## 2020-10-08 NOTE — H&P (Signed)
[facility] Pre-Procedure H&P   Patient ID: Cassandra Joyce is a 75 y.o. female.  Gastroenterology Provider: Annamaria Helling, DO  Referring Provider: Laurine Blazer, PA PCP: Leonel Ramsay, MD  Date: 10/08/2020   HPI Cassandra Joyce is a 75 y.o. female who presents today for Esophagogastroduodenoscopy for gerd and barretts surveillance.  Egd in 2019 with esophagitis; 2cm short segment. No dysplasia  taking ppi w/o issue  No dysphagia/odynophagia.  Appetite and weight stable.  No NSAID tobacco or alcohol use  H/o lymphocytic colitis  No other acute gi complaints.  Past Medical History:  Diagnosis Date   Actinic keratosis 09/28/2020   upper sternum, bx   Barrett's esophagus    Basal cell carcinoma 11/13/2007   Left nasal root. Excised 12/07/2007, margins free.   Cancer ([place])    skin   Diabetes mellitus type 1 (HCC)    Medtronic Medimed Insulin pump   Dysplastic nevus 04/07/2009   RLQA. Mild to moderate atypia, edges free.   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 ENDOSCOPY;  Service: Endoscopy;  Laterality: N/A;   ESOPHAGOGASTRODUODENOSCOPY (EGD) WITH PROPOFOL N/A 08/28/2017    Procedure: ESOPHAGOGASTRODUODENOSCOPY (EGD) WITH PROPOFOL;  Surgeon: Manya Silvas, MD;  Location: [facility] ENDOSCOPY;  Service: Endoscopy;  Laterality: N/A;   EYE SURGERY     HERNIA REPAIR Left    inguinal '73   HIP ARTHROPLASTY Right 03/26/2015   Procedure: ARTHROPLASTY BIPOLAR HIP (HEMIARTHROPLASTY);  Surgeon: Earnestine Leys, MD;  Location: [facility] ORS;  Service: Orthopedics;  Laterality: Right;   TONSILLECTOMY     TUBAL LIGATION      Family History No h/o GI disease or malignancy  Review of Systems  Constitutional:  Negative for activity change, appetite change, fatigue, fever and unexpected weight change.  HENT:  Negative for trouble swallowing and voice change.   Respiratory:  Negative for shortness of breath and wheezing.   Cardiovascular:  Negative for chest pain and palpitations.  Gastrointestinal:  Negative for abdominal distention, abdominal pain, anal bleeding, blood in stool, constipation, diarrhea, nausea, rectal pain and vomiting.  Musculoskeletal:  Negative for arthralgias and myalgias.  Skin:  Negative for color change and pallor.  Neurological:  Negative for dizziness, syncope and weakness.  Psychiatric/Behavioral:  Negative for confusion.   All other systems reviewed and are negative.   Medications No current facility-administered medications on file prior to encounter.   Current Outpatient Medications on File Prior to Encounter  Medication Sig Dispense Refill   calcium carbonate (OSCAL) 1500 (600 Ca) MG TABS tablet Take by mouth 2 (two) times daily with a meal.  enalapril (VASOTEC) 2.5 MG tablet Take 2.5 mg by mouth.     fluticasone (FLONASE) 50 MCG/ACT nasal spray Place 2 sprays into both nostrils daily.     hydrochlorothiazide (HYDRODIURIL) 12.5 MG tablet Take 12.5 mg by mouth daily.     insulin lispro (HUMALOG) 100 UNIT/ML injection Inject 30 Units into the skin once. Pt uses insulin pump-uses 100 units every 3 days     omeprazole (PRILOSEC) 40 MG capsule Take  40 mg by mouth daily.     polyethylene glycol (MIRALAX / GLYCOLAX) packet Take 17 g by mouth 2 (two) times daily. 14 each 0   simvastatin (ZOCOR) 40 MG tablet Take 40 mg by mouth daily.     acetaminophen (TYLENOL) 500 MG tablet Take 2 tablets (1,000 mg total) by mouth every 8 (eight) hours. 30 tablet 0    Pertinent medications related to GI and procedure were reviewed by me with the patient prior to the procedure  No current facility-administered medications for this encounter.      Allergies  Allergen Reactions   Augmentin [Amoxicillin-Pot Clavulanate]     Need to clarify reaction   Mercury Swelling   Penicillin G Nausea Only    Need to clarify reaction Has patient had a PCN reaction causing immediate rash, facial/tongue/throat swelling, SOB or lightheadedness with hypotension: No Has patient had a PCN reaction causing severe rash involving mucus membranes or skin necrosis: No Has patient had a PCN reaction that required hospitalization No Has patient had a PCN reaction occurring within the last 10 years: No If all of the above answers are "NO", then may proceed with Cephalosporin use.    Sulfa Antibiotics     Need to clarify reaction   Allergies were reviewed by me prior to the procedure  Objective    There were no vitals filed for this visit.  Physical Exam Vitals reviewed.  Constitutional:      General: She is not in acute distress.    Appearance: Normal appearance. She is not ill-appearing, toxic-appearing or diaphoretic.  HENT:     Head: Normocephalic and atraumatic.     Nose: Nose normal.     Mouth/Throat:     Mouth: Mucous membranes are moist.     Pharynx: Oropharynx is clear.  Eyes:     General: No scleral icterus.    Extraocular Movements: Extraocular movements intact.  Cardiovascular:     Rate and Rhythm: Normal rate and regular rhythm.     Heart sounds: Normal heart sounds. No murmur heard.   No friction rub. No gallop.  Pulmonary:     Effort:  Pulmonary effort is normal. No respiratory distress.     Breath sounds: Normal breath sounds. No wheezing, rhonchi or rales.  Abdominal:     General: Abdomen is flat. Bowel sounds are normal. There is no distension.     Palpations: Abdomen is soft.     Tenderness: There is no abdominal tenderness. There is no guarding or rebound.  Musculoskeletal:     Cervical back: Neck supple.     Right lower leg: No edema.     Left lower leg: No edema.  Skin:    General: Skin is warm and dry.     Coloration: Skin is not jaundiced or pale.  Neurological:     General: No focal deficit present.     Mental Status: She is alert and oriented to person, place, and time. Mental status is at baseline.  Psychiatric:  Mood and Affect: Mood normal.        Behavior: Behavior normal.        Thought Content: Thought content normal.        Judgment: Judgment normal.     Assessment:  Ms. Nicloe W Joyce is a 75 y.o. female  who presents today for Esophagogastroduodenoscopy for gerd and barretts surveillance.  Plan:  Esophagogastroduodenoscopy with possible intervention today  Esophagogastroduodenoscopy with possible biopsy, control of bleeding, polypectomy, and interventions as necessary has been discussed with the patient/patient representative. Informed consent was obtained from the patient/patient representative after explaining the indication, nature, and risks of the procedure including but not limited to death, bleeding, perforation, missed neoplasm/lesions, cardiorespiratory compromise, and reaction to medications. Opportunity for questions was given and appropriate answers were provided. Patient/patient representative has verbalized understanding is amenable to undergoing the procedure.   Annamaria Helling, DO  [facility]  Portions of the record may have been created with voice recognition software. Occasional wrong-word or 'sound-a-like' substitutions may have occurred due to the  inherent limitations of voice recognition software.  Read the chart carefully and recognize, using context, where substitutions may have occurred.

## 2020-10-08 NOTE — Transfer of Care (Signed)
Immediate Anesthesia Transfer of Care Note  Patient: Euretha W [name]  Procedure(s) Performed: ESOPHAGOGASTRODUODENOSCOPY (EGD)  Patient Location: PACU  Anesthesia Type:General  Level of Consciousness:  Airway & Oxygen Therapy: Patient Spontanous Breathing and Patient connected to nasal cannula oxygen  Post-op Assessment: Report given to RN and Post -op Vital signs reviewed and stable  Post vital signs: Reviewed and stable  Last Vitals:  Vitals Value Taken Time  BP    Temp    Pulse    Resp    SpO2      Last Pain:  Vitals:   10/08/20 0923  TempSrc: Temporal  PainSc: 0-No pain         Complications: No notable events documented.

## 2020-10-09 ENCOUNTER — Encounter: Payer: Self-pay | Admitting: Gastroenterology

## 2020-10-09 LAB — SURGICAL PATHOLOGY

## 2020-10-23 ENCOUNTER — Encounter: Payer: Self-pay | Admitting: *Deleted

## 2020-10-26 ENCOUNTER — Ambulatory Visit: Admit: 2020-10-26 | Payer: Medicare Other

## 2020-10-26 SURGERY — EGD (ESOPHAGOGASTRODUODENOSCOPY)
Anesthesia: General

## 2020-11-24 ENCOUNTER — Encounter: Payer: Self-pay | Admitting: Ophthalmology

## 2020-12-02 ENCOUNTER — Encounter
Admission: RE | Disposition: A | Discharge status: Home / Self Care | Payer: Self-pay | Source: Home / Self Care | Attending: Ophthalmology

## 2020-12-02 ENCOUNTER — Ambulatory Visit: Payer: Medicare Other | Admitting: Anesthesiology

## 2020-12-02 ENCOUNTER — Encounter: Payer: Self-pay | Admitting: Ophthalmology

## 2020-12-02 ENCOUNTER — Other Ambulatory Visit: Payer: Self-pay

## 2020-12-02 ENCOUNTER — Ambulatory Visit
Admission: RE | Admit: 2020-12-02 | Discharge: 2020-12-02 | Disposition: A | Discharge status: Home / Self Care | Payer: Medicare Other | Attending: Ophthalmology | Admitting: Ophthalmology

## 2020-12-02 DIAGNOSIS — I10 Essential (primary) hypertension: Secondary | ICD-10-CM | POA: Insufficient documentation

## 2020-12-02 DIAGNOSIS — Z794 Long term (current) use of insulin: Secondary | ICD-10-CM | POA: Diagnosis not present

## 2020-12-02 DIAGNOSIS — M1990 Unspecified osteoarthritis, unspecified site: Secondary | ICD-10-CM | POA: Insufficient documentation

## 2020-12-02 DIAGNOSIS — H2511 Age-related nuclear cataract, right eye: Secondary | ICD-10-CM | POA: Insufficient documentation

## 2020-12-02 DIAGNOSIS — K219 Gastro-esophageal reflux disease without esophagitis: Secondary | ICD-10-CM | POA: Insufficient documentation

## 2020-12-02 DIAGNOSIS — Z9641 Presence of insulin pump (external) (internal): Secondary | ICD-10-CM | POA: Diagnosis not present

## 2020-12-02 DIAGNOSIS — E1036 Type 1 diabetes mellitus with diabetic cataract: Secondary | ICD-10-CM | POA: Diagnosis not present

## 2020-12-02 HISTORY — DX: Presence of external hearing-aid: Z97.4

## 2020-12-02 HISTORY — PX: CATARACT EXTRACTION W/PHACO: SHX586

## 2020-12-02 HISTORY — DX: Dizziness and giddiness: R42

## 2020-12-02 LAB — GLUCOSE, CAPILLARY
Glucose-Capillary: 191 mg/dL — ABNORMAL HIGH (ref 70–99)
Glucose-Capillary: 156 mg/dL — ABNORMAL HIGH (ref 70–99)

## 2020-12-02 SURGERY — PHACOEMULSIFICATION, CATARACT, WITH IOL INSERTION
Anesthesia: Monitor Anesthesia Care | Site: Eye | Laterality: Right

## 2020-12-02 MED ORDER — ONDANSETRON HCL 4 MG/2ML IJ SOLN: 4.0000 mg | Freq: Once | INTRAMUSCULAR | Status: DC | PRN

## 2020-12-02 MED ORDER — LACTATED RINGERS IV SOLN: INTRAVENOUS | Status: DC

## 2020-12-02 MED ORDER — SIGHTPATH DOSE#1 NA HYALUR & NA CHOND-NA HYALUR IO KIT
PACK | INTRAOCULAR | Status: DC | PRN
  Administered 2020-12-02: 1 via OPHTHALMIC

## 2020-12-02 MED ORDER — SIGHTPATH DOSE#1 BSS IO SOLN
INTRAOCULAR | Status: DC | PRN
  Administered 2020-12-02: 90 mL via OPHTHALMIC

## 2020-12-02 MED ORDER — CEFUROXIME OPHTHALMIC INJECTION 1 MG/0.1 ML
INJECTION | OPHTHALMIC | Status: DC | PRN
  Administered 2020-12-02: 0.1 mL via INTRACAMERAL

## 2020-12-02 MED ORDER — BRIMONIDINE TARTRATE-TIMOLOL 0.2-0.5 % OP SOLN
OPHTHALMIC | Status: DC | PRN
  Administered 2020-12-02: 1 [drp] via OPHTHALMIC

## 2020-12-02 MED ORDER — POVIDONE-IODINE 5 % OP SOLN
OPHTHALMIC | Status: DC | PRN
  Administered 2020-12-02: 1 via OPHTHALMIC

## 2020-12-02 MED ORDER — SIGHTPATH DOSE#1 BSS IO SOLN
INTRAOCULAR | Status: DC | PRN
  Administered 2020-12-02: 1 mL via INTRAMUSCULAR

## 2020-12-02 MED ORDER — FENTANYL CITRATE (PF) 100 MCG/2ML IJ SOLN
INTRAMUSCULAR | Status: DC | PRN
  Administered 2020-12-02: 50 ug via INTRAVENOUS

## 2020-12-02 MED ORDER — ARMC OPHTHALMIC DILATING DROPS
1.0000 "application " | OPHTHALMIC | Status: DC | PRN
  Administered 2020-12-02 (×3): 1 via OPHTHALMIC

## 2020-12-02 MED ORDER — ACETAMINOPHEN 160 MG/5ML PO SOLN: 975.0000 mg | Freq: Once | ORAL | Status: DC | PRN

## 2020-12-02 MED ORDER — MIDAZOLAM HCL 2 MG/2ML IJ SOLN
INTRAMUSCULAR | Status: DC | PRN
  Administered 2020-12-02: 1 mg via INTRAVENOUS

## 2020-12-02 MED ORDER — TETRACAINE HCL 0.5 % OP SOLN
1.0000 [drp] | OPHTHALMIC | Status: DC | PRN
  Administered 2020-12-02 (×3): 1 [drp] via OPHTHALMIC

## 2020-12-02 MED ORDER — ACETAMINOPHEN 500 MG PO TABS: 1000.0000 mg | ORAL_TABLET | Freq: Once | ORAL | Status: DC | PRN

## 2020-12-02 MED ORDER — SIGHTPATH DOSE#1 BSS IO SOLN
INTRAOCULAR | Status: DC | PRN
  Administered 2020-12-02: 15 mL

## 2020-12-02 SURGICAL SUPPLY — 19 items
ANGLE REVERSE CUT SHRT 25GA (CUTTER) ×2
CANNULA ANT/CHMB 27GA (MISCELLANEOUS) ×2 IMPLANT
CYSTOTOME ANGL RVRS SHRT 25GA (CUTTER) ×1 IMPLANT
GLOVE SRG 8 PF TXTR STRL LF DI (GLOVE) ×1 IMPLANT
GLOVE SURG ENC TEXT LTX SZ7.5 (GLOVE) ×2 IMPLANT
GLOVE SURG UNDER POLY LF SZ8 (GLOVE) ×2
GOWN STRL REUS W/ TWL LRG LVL3 (GOWN DISPOSABLE) ×2 IMPLANT
GOWN STRL REUS W/TWL LRG LVL3 (GOWN DISPOSABLE) ×4
LENS IOL ACRYSOF IQ 20.0 (Intraocular Lens) ×2 IMPLANT
MARKER SKIN DUAL TIP RULER LAB (MISCELLANEOUS) ×2 IMPLANT
NEEDLE CAPSULORHEX 25GA (NEEDLE) IMPLANT
NEEDLE FILTER BLUNT 18X 1/2SAF (NEEDLE) ×2
NEEDLE FILTER BLUNT 18X1 1/2 (NEEDLE) ×2 IMPLANT
PACK EYE AFTER SURG (MISCELLANEOUS) ×2 IMPLANT
SYR 3ML LL SCALE MARK (SYRINGE) ×4 IMPLANT
SYR TB 1ML LUER SLIP (SYRINGE) ×2 IMPLANT
TIP ITREPID SGL USE BENT I/A (SUCTIONS) ×2 IMPLANT
WATER STERILE IRR 250ML POUR (IV SOLUTION) ×2 IMPLANT
WIPE NON LINTING 3.25X3.25 (MISCELLANEOUS) ×2 IMPLANT

## 2020-12-02 NOTE — Op Note (Signed)
LOCATION:  [place]   PREOPERATIVE DIAGNOSIS:    Nuclear sclerotic cataract right eye. H25.11   POSTOPERATIVE DIAGNOSIS:  Nuclear sclerotic cataract right eye.     PROCEDURE:  Phacoemusification with posterior chamber intraocular lens placement of the right eye   ULTRASOUND TIME: Procedure(s) with comments: CATARACT EXTRACTION PHACO AND INTRAOCULAR LENS PLACEMENT (IOC) RIGHT DIABETIC 14.85 01:41.9 (Right) - Diabetic  LENS:   Implant Name Type Inv. Item Serial No. Manufacturer Lot No. LRB No. Used Action  LENS IOL ACRYSOF IQ 20.0 - [id number] Intraocular Lens LENS IOL ACRYSOF IQ 20.0 [id number] ALCON  Right 1 Implanted         SURGEON:  Wyonia Hough, MD   ANESTHESIA:  Topical with tetracaine drops and 2% Xylocaine jelly, augmented with 1% preservative-free intracameral lidocaine.    COMPLICATIONS:  None.   DESCRIPTION OF PROCEDURE:  The patient was identified in the holding room and transported to the operating room and placed in the supine position under the operating microscope.  The right eye was identified as the operative eye and it was prepped and draped in the usual sterile ophthalmic fashion.   A 1 millimeter clear-corneal paracentesis was made at the 12:00 position.  0.5 ml of preservative-free 1% lidocaine was injected into the anterior chamber. The anterior chamber was filled with Viscoat viscoelastic.  A 2.4 millimeter keratome was used to make a near-clear corneal incision at the 9:00 position.  A curvilinear capsulorrhexis was made with a cystotome and capsulorrhexis forceps.  Balanced salt solution was used to hydrodissect and hydrodelineate the nucleus.   Phacoemulsification was then used in stop and chop fashion to remove the lens nucleus and epinucleus.  The remaining cortex was then removed using the irrigation and aspiration handpiece. Provisc was then placed into the capsular bag to distend it for lens placement.  A lens was then injected  into the capsular bag.  The remaining viscoelastic was aspirated.   Wounds were hydrated with balanced salt solution.  The anterior chamber was inflated to a physiologic pressure with balanced salt solution.  No wound leaks were noted. Cefuroxime 0.1 ml of a 10mg /ml solution was injected into the anterior chamber for a dose of 1 mg of intracameral antibiotic at the completion of the case.   Timolol and Brimonidine drops were applied to the eye.  The patient was taken to the recovery room in stable condition without complications of anesthesia or surgery.   Cassandra Joyce 12/02/2020, 8:10 AM

## 2020-12-02 NOTE — Anesthesia Postprocedure Evaluation (Signed)
Anesthesia Post Note  Patient: Cassandra Joyce  Procedure(s) Performed: CATARACT EXTRACTION PHACO AND INTRAOCULAR LENS PLACEMENT (IOC) RIGHT DIABETIC 14.85 01:41.9 (Right: Eye)     Patient location during evaluation: PACU Anesthesia Type: MAC Level of consciousness: awake and alert Pain management: pain level controlled Vital Signs Assessment: post-procedure vital signs reviewed and stable Respiratory status: spontaneous breathing, nonlabored ventilation and respiratory function stable Cardiovascular status: stable and blood pressure returned to baseline Postop Assessment: no apparent nausea or vomiting Anesthetic complications: no   No notable events documented.  April Manson

## 2020-12-02 NOTE — Anesthesia Procedure Notes (Signed)
Procedure Name: MAC Date/Time: 12/02/2020 7:44 AM Performed by: Dionne Bucy, CRNA Pre-anesthesia Checklist: Patient identified, Emergency Drugs available, Suction available, Patient being monitored and Timeout performed Patient Re-evaluated:Patient Re-evaluated prior to induction Oxygen Delivery Method: Nasal cannula Placement Confirmation: positive ETCO2

## 2020-12-02 NOTE — H&P (Signed)
[facility]   Primary Care Physician:  Leonel Ramsay, MD Ophthalmologist: Dr. Leandrew Koyanagi  Pre-Procedure History & Physical: HPI:  Cassandra Joyce is a 75 y.o. female here for ophthalmic surgery.   Past Medical History:  Diagnosis Date   Actinic keratosis 09/28/2020   upper sternum, bx   Barrett's esophagus    Basal cell carcinoma 11/13/2007   Left nasal root. Excised 12/07/2007, margins free.   Cancer ([place])    skin   Diabetes mellitus type 1 (HCC)    Medtronic Medimed Insulin pump   Dysplastic nevus 04/07/2009   RLQA. Mild to moderate atypia, edges free.   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   Procedure: CONVERSION RIGHT HIP HEMI TO TOTAL HIP ARTHROPLASTY;  Surgeon: Paralee Cancel, MD;  Location: [facility];  Service: Orthopedics;  Laterality: Right;   ESOPHAGOGASTRODUODENOSCOPY N/A 07/11/2014   Procedure: ESOPHAGOGASTRODUODENOSCOPY (EGD);  Surgeon: Manya Silvas, MD;  Location: [facility] ENDOSCOPY;  Service: Endoscopy;  Laterality: N/A;   ESOPHAGOGASTRODUODENOSCOPY N/A 10/08/2020   Procedure: ESOPHAGOGASTRODUODENOSCOPY (EGD);  Surgeon: Annamaria Helling, DO;  Location: [facility] ENDOSCOPY;  Service: Gastroenterology;  Laterality: N/A;   ESOPHAGOGASTRODUODENOSCOPY (EGD) WITH PROPOFOL N/A 08/28/2017   Procedure: ESOPHAGOGASTRODUODENOSCOPY (EGD) WITH PROPOFOL;  Surgeon: Manya Silvas, MD;  Location: [facility] ENDOSCOPY;   Service: Endoscopy;  Laterality: N/A;   EYE SURGERY     HERNIA REPAIR Left    inguinal '73   HIP ARTHROPLASTY Right 03/26/2015   Procedure: ARTHROPLASTY BIPOLAR HIP (HEMIARTHROPLASTY);  Surgeon: Earnestine Leys, MD;  Location: [facility] ORS;  Service: Orthopedics;  Laterality: Right;   JOINT REPLACEMENT     TONSILLECTOMY     TUBAL LIGATION      Prior to Admission medications   Medication Sig Start Date End Date Taking? Authorizing Provider  acetaminophen (TYLENOL) 500 MG tablet Take 2 tablets (1,000 mg total) by mouth every 8 (eight) hours. 08/14/15  Yes Babish, Rodman Key, PA-C  Alpha-Lipoic Acid 200 MG TABS Take by mouth daily.   Yes [provider]  ASPIRIN 81 PO Take by mouth daily.   Yes [provider]  calcium carbonate (OSCAL) 1500 (600 Ca) MG TABS tablet Take by mouth 2 (two) times daily with a meal.   Yes [provider]  cyanocobalamin 1000 MCG tablet Take 1,000 mcg by mouth daily.   Yes [provider]  enalapril (VASOTEC) 2.5 MG tablet Take 2.5 mg by mouth.   Yes [provider]  fluticasone (FLONASE) 50 MCG/ACT nasal spray Place 2 sprays into both nostrils daily.   Yes [provider]  hydrochlorothiazide (HYDRODIURIL) 12.5 MG tablet Take 12.5 mg by mouth daily.   Yes [provider]  insulin lispro (HUMALOG) 100 UNIT/ML injection Inject 30 Units into the skin once. Pt uses  insulin pump-uses 100 units every 3 days   Yes [provider]  mometasone (NASONEX) 50 MCG/ACT nasal spray Place 2 sprays into the nose daily.   Yes [provider]  omeprazole (PRILOSEC) 40 MG capsule Take 40 mg by mouth daily.   Yes [provider]  simvastatin (ZOCOR) 40 MG tablet Take 40 mg by mouth daily.   Yes [provider]    Allergies as of 11/18/2020 - Review Complete 10/23/2020  Allergen Reaction Noted   Augmentin [amoxicillin-pot clavulanate]  07/10/2014   Mercury Swelling 07/10/2014   Penicillin g  Nausea Only 07/10/2014   Sulfa antibiotics  07/10/2014    Family History  Problem Relation Age of Onset   Diabetes Mellitus II Mother    Diabetes Mellitus II Sister    Breast cancer Neg Hx     Social History   Socioeconomic History   Marital status: Married    Spouse name: Not on file   Number of children: Not on file   Years of education: Not on file   Highest education level: Not on file  Occupational History   Not on file  Tobacco Use   Smoking status: Never   Smokeless tobacco: Never  Vaping Use   Vaping Use: Never used  Substance and Sexual Activity   Alcohol use: Not Currently    Comment: rare   Drug use: No   Sexual activity: Not on file  Other Topics Concern   Not on file  Social History Narrative   Not on file   Social Determinants of Health   Financial Resource Strain: Not on file  Food Insecurity: Not on file  Transportation Needs: Not on file  Physical Activity: Not on file  Stress: Not on file  Social Connections: Not on file  Intimate Partner Violence: Not on file    Review of Systems: See HPI, otherwise negative ROS  Physical Exam: BP (!) 150/62   Pulse 62   Temp (!) 97.2 F (36.2 C) (Temporal)   Resp 20   Ht 5\' 5"  (1.651 m)   Wt 61.2 kg   SpO2 99%   BMI 22.47 kg/m  General:   Alert,  pleasant and cooperative in NAD Head:  Normocephalic and atraumatic. Lungs:  Clear to auscultation.    Heart:  Regular rate and rhythm.   Impression/Plan: Cassandra Joyce is here for ophthalmic surgery.  Risks, benefits, limitations, and alternatives regarding ophthalmic surgery have been reviewed with the patient.  Questions have been answered.  All parties agreeable.   Leandrew Koyanagi, MD  12/02/2020, 7:36 AM

## 2020-12-02 NOTE — Transfer of Care (Signed)
Immediate Anesthesia Transfer of Care Note  Patient: Cassandra Joyce  Procedure(s) Performed: CATARACT EXTRACTION PHACO AND INTRAOCULAR LENS PLACEMENT (IOC) RIGHT DIABETIC 14.85 01:41.9 (Right: Eye)  Patient Location: PACU  Anesthesia Type: MAC  Level of Consciousness: awake, alert  and patient cooperative  Airway and Oxygen Therapy: Patient Spontanous Breathing and Patient connected to supplemental oxygen  Post-op Assessment: Post-op Vital signs reviewed, Patient's Cardiovascular Status Stable, Respiratory Function Stable, Patent Airway and No signs of Nausea or vomiting  Post-op Vital Signs: Reviewed and stable  Complications: No notable events documented.

## 2020-12-02 NOTE — Anesthesia Preprocedure Evaluation (Signed)
Anesthesia Evaluation  Patient identified by MRN, date of birth, ID band Patient awake    Reviewed: Allergy & Precautions, H&P , NPO status , Patient's Chart, lab work & pertinent test results, reviewed documented beta blocker date and time   Airway Mallampati: II  TM Distance: >3 FB Neck ROM: full    Dental no notable dental hx.    Pulmonary neg pulmonary ROS,    Pulmonary exam normal breath sounds clear to auscultation       Cardiovascular Exercise Tolerance: Good hypertension, negative cardio ROS   Rhythm:regular Rate:Normal     Neuro/Psych negative neurological ROS  negative psych ROS   GI/Hepatic Neg liver ROS, GERD  ,  Endo/Other  diabetes (Insulin pump), Type 1, Insulin Dependent  Renal/GU negative Renal ROS  negative genitourinary   Musculoskeletal  (+) Arthritis ,   Abdominal   Peds  Hematology negative hematology ROS (+)   Anesthesia Other Findings   Reproductive/Obstetrics negative OB ROS                             Anesthesia Physical Anesthesia Plan  ASA: 3  Anesthesia Plan: MAC   Post-op Pain Management:    Induction:   PONV Risk Score and Plan: 2 and TIVA and Treatment may vary due to age or medical condition  Airway Management Planned:   Additional Equipment:   Intra-op Plan:   Post-operative Plan:   Informed Consent: I have reviewed the patients History and Physical, chart, labs and discussed the procedure including the risks, benefits and alternatives for the proposed anesthesia with the patient or authorized representative who has indicated his/her understanding and acceptance.     Dental Advisory Given  Plan Discussed with: CRNA  Anesthesia Plan Comments:         Anesthesia Quick Evaluation

## 2020-12-24 ENCOUNTER — Other Ambulatory Visit: Payer: Self-pay | Admitting: Dermatology

## 2020-12-24 DIAGNOSIS — L237 Allergic contact dermatitis due to plants, except food: Secondary | ICD-10-CM

## 2021-06-29 ENCOUNTER — Other Ambulatory Visit: Payer: Self-pay | Admitting: Infectious Diseases

## 2021-06-29 DIAGNOSIS — Z1231 Encounter for screening mammogram for malignant neoplasm of breast: Secondary | ICD-10-CM

## 2021-08-09 ENCOUNTER — Ambulatory Visit
Admission: RE | Admit: 2021-08-09 | Discharge: 2021-08-09 | Disposition: A | Discharge status: Home / Self Care | Payer: Medicare Other | Source: Ambulatory Visit | Attending: Infectious Diseases | Admitting: Infectious Diseases

## 2021-08-09 DIAGNOSIS — Z1231 Encounter for screening mammogram for malignant neoplasm of breast: Secondary | ICD-10-CM | POA: Diagnosis not present

## 2021-09-28 ENCOUNTER — Ambulatory Visit (INDEPENDENT_AMBULATORY_CARE_PROVIDER_SITE_OTHER): Payer: Medicare Other | Admitting: Dermatology

## 2021-09-28 DIAGNOSIS — L570 Actinic keratosis: Secondary | ICD-10-CM

## 2021-09-28 DIAGNOSIS — D1801 Hemangioma of skin and subcutaneous tissue: Secondary | ICD-10-CM

## 2021-09-28 DIAGNOSIS — Z85828 Personal history of other malignant neoplasm of skin: Secondary | ICD-10-CM

## 2021-09-28 DIAGNOSIS — Z1283 Encounter for screening for malignant neoplasm of skin: Secondary | ICD-10-CM | POA: Diagnosis not present

## 2021-09-28 DIAGNOSIS — L820 Inflamed seborrheic keratosis: Secondary | ICD-10-CM

## 2021-09-28 DIAGNOSIS — I8393 Asymptomatic varicose veins of bilateral lower extremities: Secondary | ICD-10-CM

## 2021-09-28 DIAGNOSIS — L219 Seborrheic dermatitis, unspecified: Secondary | ICD-10-CM | POA: Diagnosis not present

## 2021-09-28 DIAGNOSIS — D692 Other nonthrombocytopenic purpura: Secondary | ICD-10-CM

## 2021-09-28 DIAGNOSIS — D2371 Other benign neoplasm of skin of right lower limb, including hip: Secondary | ICD-10-CM

## 2021-09-28 DIAGNOSIS — L821 Other seborrheic keratosis: Secondary | ICD-10-CM

## 2021-09-28 DIAGNOSIS — L578 Other skin changes due to chronic exposure to nonionizing radiation: Secondary | ICD-10-CM

## 2021-09-28 DIAGNOSIS — D2361 Other benign neoplasm of skin of right upper limb, including shoulder: Secondary | ICD-10-CM

## 2021-09-28 NOTE — Patient Instructions (Addendum)
For Scalp  Recommend OTC Head & Shoulders shampoo 2-3x per week, massage into scalp and let sit 3-5 minutes before rinsing.  Actinic keratoses are precancerous spots that appear secondary to cumulative UV radiation exposure/sun exposure over time. They are chronic with expected duration over 1 year. A portion of actinic keratoses will progress to squamous cell carcinoma of the skin. It is not possible to reliably predict which spots will progress to skin cancer and so treatment is recommended to prevent development of skin cancer.  Recommend daily broad spectrum sunscreen SPF 30+ to sun-exposed areas, reapply every 2 hours as needed.  Recommend staying in the shade or wearing long sleeves, sun glasses (UVA+UVB protection) and wide brim hats (4-inch brim around the entire circumference of the hat). Call for new or changing lesions.   Seborrheic Keratosis  What causes seborrheic keratoses? Seborrheic keratoses are harmless, common skin growths that first appear during adult life.  As time goes by, more growths appear.  Some people may develop a large number of them.  Seborrheic keratoses appear on both covered and uncovered body parts.  They are not caused by sunlight.  The tendency to develop seborrheic keratoses can be inherited.  They vary in color from skin-colored to gray, brown, or even black.  They can be either smooth or have a rough, warty surface.   Seborrheic keratoses are superficial and look as if they were stuck on the skin.  Under the microscope this type of keratosis looks like layers upon layers of skin.  That is why at times the top layer may seem to fall off, but the rest of the growth remains and re-grows.    Treatment Seborrheic keratoses do not need to be treated, but can easily be removed in the office.  Seborrheic keratoses often cause symptoms when they rub on clothing or jewelry.  Lesions can be in the way of shaving.  If they become inflamed, they can cause itching,  soreness, or burning.  Removal of a seborrheic keratosis can be accomplished by freezing, burning, or surgery. If any spot bleeds, scabs, or grows rapidly, please return to have it checked, as these can be an indication of a skin cancer.  Cryotherapy Aftercare  Wash gently with soap and water everyday.   Apply Vaseline and Band-Aid daily until healed.     Melanoma ABCDEs  Melanoma is the most dangerous type of skin cancer, and is the leading cause of death from skin disease.  You are more likely to develop melanoma if you: Have light-colored skin, light-colored eyes, or red or blond hair Spend a lot of time in the sun Tan regularly, either outdoors or in a tanning bed Have had blistering sunburns, especially during childhood Have a close family member who has had a melanoma Have atypical moles or large birthmarks  Early detection of melanoma is key since treatment is typically straightforward and cure rates are extremely high if we catch it early.   The first sign of melanoma is often a change in a mole or a new dark spot.  The ABCDE system is a way of remembering the signs of melanoma.  A for asymmetry:  The two halves do not match. B for border:  The edges of the growth are irregular. C for color:  A mixture of colors are present instead of an even brown color. D for diameter:  Melanomas are usually (but not always) greater than 44m - the size of a pencil eraser. E for evolution:  The spot keeps changing in size, shape, and color.  Please check your skin once per month between visits. You can use a small mirror in front and a large mirror behind you to keep an eye on the back side or your body.   If you see any new or changing lesions before your next follow-up, please call to schedule a visit.  Please continue daily skin protection including broad spectrum sunscreen SPF 30+ to sun-exposed areas, reapplying every 2 hours as needed when you're outdoors.   Staying in the shade or  wearing long sleeves, sun glasses (UVA+UVB protection) and wide brim hats (4-inch brim around the entire circumference of the hat) are also recommended for sun protection.    Due to recent changes in healthcare laws, you may see results of your pathology and/or laboratory studies on MyChart before the doctors have had a chance to review them. We understand that in some cases there may be results that are confusing or concerning to you. Please understand that not all results are received at the same time and often the doctors may need to interpret multiple results in order to provide you with the best plan of care or course of treatment. Therefore, we ask that you please give [place] 2 business days to thoroughly review all your results before contacting the office for clarification. Should we see a critical lab result, you will be contacted sooner.   If You Need Anything After Your Visit  If you have any questions or concerns for your doctor, please call our main line at [phone] and press option 4 to reach your doctor's medical assistant. If no one answers, please leave a voicemail as directed and we will return your call as soon as possible. Messages left after 4 pm will be answered the following business day.   You may also send [place] a message via [place]. We typically respond to MyChart messages within 1-2 business days.  For prescription refills, please ask your pharmacy to contact our office. Our fax number is [phone].  If you have an urgent issue when the clinic is closed that cannot wait until the next business day, you can page your doctor at the number below.    Please note that while we do our best to be available for urgent issues outside of office hours, we are not available 24/7.   If you have an urgent issue and are unable to reach [place], you may choose to seek medical care at your doctor's office, retail clinic, urgent care center, or emergency room.  If you have a medical  emergency, please immediately call 911 or go to the emergency department.  Pager Numbers  - Dr. Nehemiah Massed: [phone]  - Dr. Laurence Ferrari: [phone]  - Dr. Nicole Kindred: [phone]  In the event of inclement weather, please call our main line at [phone] for an update on the status of any delays or closures.  Dermatology Medication Tips: Please keep the boxes that topical medications come in in order to help keep track of the instructions about where and how to use these. Pharmacies typically print the medication instructions only on the boxes and not directly on the medication tubes.   If your medication is too expensive, please contact our office at [phone] option 4 or send [place] a message through [place].   We are unable to tell what your co-pay for medications will be in advance as this is different depending on your insurance coverage. However, we may be able to find  a substitute medication at lower cost or fill out paperwork to get insurance to cover a needed medication.   If a prior authorization is required to get your medication covered by your insurance company, please allow [place] 1-2 business days to complete this process.  Drug prices often vary depending on where the prescription is filled and some pharmacies may offer cheaper prices.  The website www.goodrx.com contains coupons for medications through different pharmacies. The prices here do not account for what the cost may be with help from insurance (it may be cheaper with your insurance), but the website can give you the price if you did not use any insurance.  - You can print the associated coupon and take it with your prescription to the pharmacy.  - You may also stop by our office during regular business hours and pick up a GoodRx coupon card.  - If you need your prescription sent electronically to a different pharmacy, notify our office through [facility] or by phone at [phone] option 4.     Si Usted  Necesita Algo Despus de Su Visita  Tambin puede enviarnos un mensaje a travs de Pharmacist, community. Por lo general respondemos a los mensajes de MyChart en el transcurso de 1 a 2 das hbiles.  Para renovar recetas, por favor pida a su farmacia que se ponga en contacto con nuestra oficina. Harland Dingwall de fax es [place] [employer] [phone].  Si tiene un asunto urgente cuando la clnica est cerrada y que no puede esperar hasta el siguiente da hbil, puede llamar/localizar a su doctor(a) al nmero que aparece a continuacin.   Por favor, tenga en cuenta que aunque hacemos todo lo posible para estar disponibles para asuntos urgentes fuera del horario de [place], no estamos disponibles las 24 horas del da, los 7 das de la [place].   Si tiene un problema urgente y no puede comunicarse con nosotros, puede optar por buscar atencin mdica  en el consultorio de su doctor(a), en una clnica privada, en un centro de atencin urgente o en una sala de emergencias.  Si tiene Engineering geologist, por favor llame inmediatamente al 911 o vaya a la sala de emergencias.  Nmeros de bper  - Dr. Nehemiah Massed: [phone]  - Dra. Moye: [phone]  - Dra. Nicole Kindred: [phone]  En caso de inclemencias del [place], por favor llame a Johnsie Kindred principal al [phone] para una actualizacin sobre el [place] de cualquier retraso o cierre.  Consejos para la medicacin en dermatologa: Por favor, guarde las cajas en las que vienen los medicamentos de uso tpico para ayudarle a seguir las instrucciones sobre dnde y cmo usarlos. Las farmacias generalmente imprimen las instrucciones del medicamento slo en las cajas y no directamente en los tubos del [place].   Si su medicamento es muy caro, por favor, pngase en contacto con Zigmund Daniel llamando al [phone] y presione la opcin 4 o envenos un mensaje a travs de Pharmacist, community.   No podemos decirle cul ser su copago por los medicamentos por adelantado ya que esto es  diferente dependiendo de la cobertura de su seguro. Sin embargo, es posible que podamos encontrar un medicamento sustituto a Electrical engineer un formulario para que el seguro cubra el medicamento que se considera necesario.   Si se requiere una autorizacin previa para que su compaa de seguros [place] su medicamento, por favor permtanos de 1 a 2 das hbiles para completar este proceso.  Los precios de los medicamentos varan con frecuencia dependiendo del  lugar de dnde se surte la receta y alguna farmacias pueden ofrecer precios ms baratos.  El sitio web www.goodrx.com tiene cupones para medicamentos de Airline pilot. Los precios aqu no tienen en cuenta lo que podra costar con la ayuda del seguro (puede ser ms barato con su seguro), pero el sitio web puede darle el precio si no utiliz Research scientist (physical sciences).  - Puede imprimir el cupn correspondiente y llevarlo con su receta a la farmacia.  - Tambin puede pasar por nuestra oficina durante el horario de atencin regular y Charity fundraiser una tarjeta de cupones de GoodRx.  - Si necesita que su receta se enve electrnicamente a una farmacia diferente, informe a nuestra oficina a travs de MyChart de [place] o por telfono llamando al [phone] y presione la opcin 4.

## 2021-09-28 NOTE — Progress Notes (Signed)
Follow-Up Visit   Subjective  Cassandra Joyce is a 76 y.o. female who presents for the following: Annual Exam (Tbse , hx of bcc, hx of aks, hx of isks. ).She has noticed scabs on top of head that she picks at.  Also itchy spot lower back.  The patient presents for Total-Body Skin Exam (TBSE) for skin cancer screening and mole check.  The patient has spots, moles and lesions to be evaluated, some may be new or changing and the patient has concerns that these could be cancer.   The following portions of the chart were reviewed this encounter and updated as appropriate:      Review of Systems: No other skin or systemic complaints except as noted in HPI or Assessment and Plan.   Objective  Well appearing patient in no apparent distress; mood and affect are within normal limits.  A full examination was performed including scalp, head, eyes, ears, nose, lips, neck, chest, axillae, abdomen, back, buttocks, bilateral upper extremities, bilateral lower extremities, hands, feet, fingers, toes, fingernails, and toenails. All findings within normal limits unless otherwise noted below.  face Waxy tan macules/patches  spinal lower back x 2 (2) Erythematous stuck-on, waxy papule  Scalp Mild scaling crown, pt states that it itches   crown x 10 (10) Erythematous thin papules/macules with gritty scale.    Assessment & Plan  Seborrheic keratosis face  Reassured benign age-related growth.  Recommend observation.  Discussed cryotherapy if spot(s) become irritated or inflamed.  Patient bothered by spots at face but asymptomatic, discussed topical moisturizer with Sal Acid or Lactic Acid to smooth spots  Discussed spot treat with fade cream which may help fade spots but will not get rid of spots  Discussed cosmetic procedure, noncovered.  $60 for 1st lesion and $15 for each additional lesion if done on the same day.  Maximum charge $350.  One touch-up treatment included no charge. Discussed  risks of treatment including dyspigmentation, small scar, and/or recurrence. Recommend daily broad spectrum sunscreen SPF 30+/photoprotection to treated areas once healed.  Patient deferred treatment at this time.   Inflamed seborrheic keratosis (2) spinal lower back x 2  Symptomatic, irritating, patient would like treated.  Destruction of lesion - spinal lower back x 2  Destruction method: cryotherapy   Informed consent: discussed and consent obtained   Lesion destroyed using liquid nitrogen: Yes   Region frozen until ice ball extended beyond lesion: Yes   Outcome: patient tolerated procedure well with no complications   Post-procedure details: wound care instructions given   Additional details:  Prior to procedure, discussed risks of blister formation, small wound, skin dyspigmentation, or rare scar following cryotherapy. Recommend Vaseline ointment to treated areas while healing.   Seborrheic dermatitis Scalp  With pruritus  Seborrheic Dermatitis  -  is a chronic persistent rash characterized by pinkness and scaling most commonly of the mid face but also can occur on the scalp (dandruff), ears; mid chest, mid back and groin.  It tends to be exacerbated by stress and cooler weather.  People who have neurologic disease may experience new onset or exacerbation of existing seborrheic dermatitis.  The condition is not curable but treatable and can be controlled.  Recommend OTC Head & Shoulders shampoo 2-3x per week, massage into scalp and let sit 3-5 minutes before rinsing.   Actinic keratosis (10) crown x 10  Actinic keratoses are precancerous spots that appear secondary to cumulative UV radiation exposure/sun exposure over time. They are chronic with  expected duration over 1 year. A portion of actinic keratoses will progress to squamous cell carcinoma of the skin. It is not possible to reliably predict which spots will progress to skin cancer and so treatment is recommended to  prevent development of skin cancer.  Recommend daily broad spectrum sunscreen SPF 30+ to sun-exposed areas, reapply every 2 hours as needed.  Recommend staying in the shade or wearing long sleeves, sun glasses (UVA+UVB protection) and wide brim hats (4-inch brim around the entire circumference of the hat). Call for new or changing lesions.  Destruction of lesion - crown x 10  Destruction method: cryotherapy   Informed consent: discussed and consent obtained   Lesion destroyed using liquid nitrogen: Yes   Region frozen until ice ball extended beyond lesion: Yes   Outcome: patient tolerated procedure well with no complications   Post-procedure details: wound care instructions given   Additional details:  Prior to procedure, discussed risks of blister formation, small wound, skin dyspigmentation, or rare scar following cryotherapy. Recommend Vaseline ointment to treated areas while healing.   Lentigines - Scattered tan macules - Due to sun exposure - Benign-appearing, observe - Recommend daily broad spectrum sunscreen SPF 30+ to sun-exposed areas, reapply every 2 hours as needed. - Call for any changes  Seborrheic Keratoses - Stuck-on, waxy, tan-brown papules and/or plaques  - Benign-appearing - Discussed benign etiology and prognosis. - Observe - Call for any changes  Melanocytic Nevi - Tan-brown and/or pink-flesh-colored symmetric macules and papules - Benign appearing on exam today - Observation - Call clinic for new or changing moles - Recommend daily use of broad spectrum spf 30+ sunscreen to sun-exposed areas.   Purpura - Chronic; persistent and recurrent.  Treatable, but not curable. - Violaceous macules and patches - Benign - Related to trauma, age, sun damage and/or use of blood thinners, chronic use of topical and/or oral steroids - Observe - Can use OTC arnica containing moisturizer such as Dermend Bruise Formula if desired - Call for worsening or other  concerns  Dermatofibroma  Right upper arm and right posterior ankle - Firm pink/brown papulenodule with dimple sign - Benign appearing - Call for any changes  Varicose Veins/Spider Veins - Dilated blue, purple or red veins at the lower extremities - Reassured - Smaller vessels can be treated by sclerotherapy (a procedure to inject a medicine into the veins to make them disappear) if desired, but the treatment is not covered by insurance. Larger vessels may be covered if symptomatic and we would refer to vascular surgeon if treatment desired.  Hemangiomas - Red papules - Discussed benign nature - Observe - Call for any changes  Actinic Damage - Chronic condition, secondary to cumulative UV/sun exposure - diffuse scaly erythematous macules with underlying dyspigmentation - Recommend daily broad spectrum sunscreen SPF 30+ to sun-exposed areas, reapply every 2 hours as needed.  - Staying in the shade or wearing long sleeves, sun glasses (UVA+UVB protection) and wide brim hats (4-inch brim around the entire circumference of the hat) are also recommended for sun protection.  - Call for new or changing lesions.  History of Basal Cell Carcinoma of the Skin at left nasal root excised 12/2007 and right forehead by Dr. Koleen Nimrod - No evidence of recurrence today - Recommend regular full body skin exams - Recommend daily broad spectrum sunscreen SPF 30+ to sun-exposed areas, reapply every 2 hours as needed.  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 - these may recur and new lesions may form requiring treatment to prevent transformation into skin cancer - observe for new or changing spots and contact [place] for appointment if occur - photoprotection with sun protective clothing; sunglasses and broad spectrum  sunscreen with SPF of at least 30 + and frequent self skin exams recommended - yearly exams by a dermatologist recommended for persons with history of PreCancerous Actinic Keratoses  Skin cancer screening performed today. Return in about 6 months (around 03/29/2022) for ak and seb derm at scalp, 1 year tbse . I, Ruthell Rummage, CMA, am acting as scribe for Brendolyn Patty, MD.  Documentation: I have reviewed the above documentation for accuracy and completeness, and I agree with the above.  Brendolyn Patty MD

## 2022-04-05 ENCOUNTER — Ambulatory Visit (INDEPENDENT_AMBULATORY_CARE_PROVIDER_SITE_OTHER): Payer: Medicare Other | Admitting: Dermatology

## 2022-04-05 VITALS — BP 126/70 | HR 65

## 2022-04-05 DIAGNOSIS — Z872 Personal history of diseases of the skin and subcutaneous tissue: Secondary | ICD-10-CM

## 2022-04-05 DIAGNOSIS — L820 Inflamed seborrheic keratosis: Secondary | ICD-10-CM | POA: Diagnosis not present

## 2022-04-05 DIAGNOSIS — L821 Other seborrheic keratosis: Secondary | ICD-10-CM

## 2022-04-05 DIAGNOSIS — L578 Other skin changes due to chronic exposure to nonionizing radiation: Secondary | ICD-10-CM

## 2022-04-05 DIAGNOSIS — L814 Other melanin hyperpigmentation: Secondary | ICD-10-CM

## 2022-04-05 DIAGNOSIS — L219 Seborrheic dermatitis, unspecified: Secondary | ICD-10-CM | POA: Diagnosis not present

## 2022-04-05 MED ORDER — FLUOCINONIDE 0.05 % EX SOLN: CUTANEOUS | 3 refills | Status: AC

## 2022-04-05 NOTE — Patient Instructions (Addendum)
Cryotherapy Aftercare  Wash gently with soap and water everyday.   Apply Vaseline and Band-Aid daily until healed.   Recommend OTC Gold Bond Rapid Relief Anti-Itch cream (pramoxine + menthol), CeraVe Anti-itch cream or lotion (pramoxine), Sarna lotion (Original- menthol + camphor or Sensitive- pramoxine) or Eucerin 12 hour Itch Relief lotion (menthol) up to 3 times per day to areas on body that are itchy.   Continue OTC Head & Shoulders shampoo 2-3x per week, massage into scalp and let sit 3-5 minutes before rinsing. Ok to alternate with other shampoo   Can use fluocinonide 0.05 solution apply to affected areas of scalp daily to twice daily as needed for itchy bumps      Basic OTC daily skin care regimen to prevent photoaging:   For dark areas under eyes Recommend OTC Inkey Tranexamic Acid cream to apply to hyperpigmented areas on face at night.  Can buy at [place], Carloyn Ja[place], or online for around $15.   Recommend facial moisturizer with sunscreen SPF 30 every morning (OTC brands include CeraVe AM, Neutrogena, Eucerin, Cetaphil, Aveeno, La Roche Posay).  Can also apply a topical Vit C serum which is an antioxidant (OTC brands include CeraVe, La Roche Posay, and The Ordinary) underneath sunscreen in morning. If you are outside during the day in the summer for extended periods, especially swimming and/or sweating, make sure you apply a water resistant facial sunscreen lotion spf 30 or higher.   At night recommend a cream with retinol (a vitamin A derivative which stimulates collagen production) like CeraVe skin renewing retinol serum or ROC retinol correxion cream or Neutrogena rapid wrinkle repair cream. Retinol may cause skin irritation in people with sensitive skin.  Can use it every other day and/or apply on top of a hyaluronic acid (HA) moisturizer/serum (Neutrogena Hydroboost water cream) if better tolerated that way.  Retinol may also help with lightening brown spots.   Our office sells  high quality, medically tested skin care lines such as Elta MD sunscreens (with Zinc), and Alastin skin care products, which are very effective in treating photoaging. The Alastin line includes cosmeceutical grade Vit.C serum, HA serum, Elastin stimulating moisturizers/serums, lightening serum, and sunscreens.  If you want prescription treatment, then you would need an appointment (Rx tretinoin and fade creams, Botox, filler injections, laser treatments, etc.) These prescriptions and procedures are not covered by insurance but work very well.     Seborrheic Keratosis  What causes seborrheic keratoses? Seborrheic keratoses are harmless, common skin growths that first appear during adult life.  As time goes by, more growths appear.  Some people may develop a large number of them.  Seborrheic keratoses appear on both covered and uncovered body parts.  They are not caused by sunlight.  The tendency to develop seborrheic keratoses can be inherited.  They vary in color from skin-colored to gray, brown, or even black.  They can be either smooth or have a rough, warty surface.   Seborrheic keratoses are superficial and look as if they were stuck on the skin.  Under the microscope this type of keratosis looks like layers upon layers of skin.  That is why at times the top layer may seem to fall off, but the rest of the growth remains and re-grows.    Treatment Seborrheic keratoses do not need to be treated, but can easily be removed in the office.  Seborrheic keratoses often cause symptoms when they rub on clothing or jewelry.  Lesions can be in the way of shaving.  If they become inflamed, they can cause itching, soreness, or burning.  Removal of a seborrheic keratosis can be accomplished by freezing, burning, or surgery. If any spot bleeds, scabs, or grows rapidly, please return to have it checked, as these can be an indication of a skin cancer.       Due to recent changes in healthcare laws, you may  see results of your pathology and/or laboratory studies on MyChart before the doctors have had a chance to review them. We understand that in some cases there may be results that are confusing or concerning to you. Please understand that not all results are received at the same time and often the doctors may need to interpret multiple results in order to provide you with the best plan of care or course of treatment. Therefore, we ask that you please give [place] 2 business days to thoroughly review all your results before contacting the office for clarification. Should we see a critical lab result, you will be contacted sooner.   If You Need Anything After Your Visit  If you have any questions or concerns for your doctor, please call our main line at [phone] and press option 4 to reach your doctor's medical assistant. If no one answers, please leave a voicemail as directed and we will return your call as soon as possible. Messages left after 4 pm will be answered the following business day.   You may also send [place] a message via [place]. We typically respond to MyChart messages within 1-2 business days.  For prescription refills, please ask your pharmacy to contact our office. Our fax number is [phone].  If you have an urgent issue when the clinic is closed that cannot wait until the next business day, you can page your doctor at the number below.    Please note that while we do our best to be available for urgent issues outside of office hours, we are not available 24/7.   If you have an urgent issue and are unable to reach [place], you may choose to seek medical care at your doctor's office, retail clinic, urgent care center, or emergency room.  If you have a medical emergency, please immediately call 911 or go to the emergency department.  Pager Numbers  - Dr. Nehemiah Massed: [phone]  - Dr. Laurence Ferrari: [phone]  - Dr. Nicole Kindred: [phone]  In the event of inclement weather, please call our  main line at [phone] for an update on the status of any delays or closures.  Dermatology Medication Tips: Please keep the boxes that topical medications come in in order to help keep track of the instructions about where and how to use these. Pharmacies typically print the medication instructions only on the boxes and not directly on the medication tubes.   If your medication is too expensive, please contact our office at [phone] option 4 or send [place] a message through [place].   We are unable to tell what your co-pay for medications will be in advance as this is different depending on your insurance coverage. However, we may be able to find a substitute medication at lower cost or fill out paperwork to get insurance to cover a needed medication.   If a prior authorization is required to get your medication covered by your insurance company, please allow [place] 1-2 business days to complete this process.  Drug prices often vary depending on where the prescription is filled and some pharmacies may offer cheaper prices.  The website www.goodrx.com contains  coupons for medications through different pharmacies. The prices here do not account for what the cost may be with help from insurance (it may be cheaper with your insurance), but the website can give you the price if you did not use any insurance.  - You can print the associated coupon and take it with your prescription to the pharmacy.  - You may also stop by our office during regular business hours and pick up a GoodRx coupon card.  - If you need your prescription sent electronically to a different pharmacy, notify our office through [facility] or by phone at [phone] option 4.     Si Usted Necesita Algo Despus de Su Visita  Tambin puede enviarnos un mensaje a travs de Pharmacist, community. Por lo general respondemos a los mensajes de MyChart en el transcurso de 1 a 2 das hbiles.  Para renovar recetas, por favor pida a su  farmacia que se ponga en contacto con nuestra oficina. Harland Dingwall de fax es [place] [phone].  Si tiene un asunto urgente cuando la clnica est cerrada y que no puede esperar hasta el siguiente da hbil, puede llamar/localizar a su doctor(a) al nmero que aparece a continuacin.   Por favor, tenga en cuenta que aunque hacemos todo lo posible para estar disponibles para asuntos urgentes fuera del horario de [place], no estamos disponibles las 24 horas del da, los 7 das de la [place].   Si tiene un problema urgente y no puede comunicarse con nosotros, puede optar por buscar atencin mdica  en el consultorio de su doctor(a), en una clnica privada, en un centro de atencin urgente o en una sala de emergencias.  Si tiene Engineering geologist, por favor llame inmediatamente al 911 o vaya a la sala de emergencias.  Nmeros de bper  - Dr. Nehemiah Massed: [phone]  - Dra. Moye: [phone]  - Dra. Nicole Kindred: [phone]  En caso de inclemencias del [place], por favor llame a Johnsie Kindred principal al [phone] para una actualizacin sobre el [place] de cualquier retraso o cierre.  Consejos para la medicacin en dermatologa: Por favor, guarde las cajas en las que vienen los medicamentos de uso tpico para ayudarle a seguir las instrucciones sobre dnde y cmo usarlos. Las farmacias generalmente imprimen las instrucciones del medicamento slo en las cajas y no directamente en los tubos del [place].   Si su medicamento es muy caro, por favor, pngase en contacto con Zigmund Daniel llamando al [phone] y presione la opcin 4 o envenos un mensaje a travs de Pharmacist, community.   No podemos decirle cul ser su copago por los medicamentos por adelantado ya que esto es diferente dependiendo de la cobertura de su seguro. Sin embargo, es posible que podamos encontrar un medicamento sustituto a Electrical engineer un formulario para que el seguro cubra el medicamento que se considera necesario.    Si se requiere una autorizacin previa para que su compaa de seguros [place] su medicamento, por favor permtanos de 1 a 2 das hbiles para completar este proceso.  Los precios de los medicamentos varan con frecuencia dependiendo del Environmental consultant de dnde se surte la receta y alguna farmacias pueden ofrecer precios ms baratos.  El sitio web www.goodrx.com tiene cupones para medicamentos de Airline pilot. Los precios aqu no tienen en cuenta lo que podra costar con la ayuda del seguro (puede ser ms barato con su seguro), pero el sitio web puede darle el precio si no utiliz Research scientist (physical sciences).  - Puede imprimir el cupn  correspondiente y llevarlo con su receta a la farmacia.  - Tambin puede pasar por nuestra oficina durante el horario de atencin regular y Charity fundraiser una tarjeta de cupones de GoodRx.  - Si necesita que su receta se enve electrnicamente a una farmacia diferente, informe a nuestra oficina a travs de MyChart de  o por telfono llamando al [phone] y presione la opcin 4.

## 2022-04-05 NOTE — Progress Notes (Signed)
Follow-Up Visit   Subjective  Cassandra Joyce is a 77 y.o. female who presents for the following: Seborrheic dermatitis at scalp, hx of aks at crown of scalp, hx of isk at lower back. Reports some itchy areas at back.     The following portions of the chart were reviewed this encounter and updated as appropriate: medications, allergies, medical history  Review of Systems:  No other skin or systemic complaints except as noted in HPI or Assessment and Plan.  Objective  Well appearing patient in no apparent distress; mood and affect are within normal limits.  Areas Examined: Scalp, ears and face  Relevant physical exam findings are noted in the Assessment and Plan.       Assessment & Plan   SEBORRHEIC DERMATITIS Exam: Mild erythema and scale, with two crusted papules at vertex (vrs ISKs)  Chronic and persistent condition with duration or expected duration over one year. Condition is symptomatic/ bothersome to patient. Not currently at goal. Still gets itchy bumps off and on  Seborrheic Dermatitis  -  is a chronic persistent rash characterized by pinkness and scaling most commonly of the mid face but also can occur on the scalp (dandruff), ears; mid chest, mid back and groin.  It tends to be exacerbated by stress and cooler weather.  People who have neurologic disease may experience new onset or exacerbation of existing seborrheic dermatitis.  The condition is not curable but treatable and can be controlled.  Treatment Plan: Start fluocinonide 0.05 % solution - apply to aa of scalp qd/bid prn for itchy bumps  Continue  OTC Head & Shoulders shampoo 2-3x per week, massage into scalp and let sit 3-5 minutes before rinsing. Ok to alternate between regular shampoo    INFLAMED SEBORRHEIC KERATOSIS Exam: Erythematous keratotic or waxy stuck-on papule  Symptomatic, irritating, patient would like treated.  Benign-appearing.  Call clinic for new or changing lesions.   Prior to  procedure, discussed risks of blister formation, small wound, skin dyspigmentation, or rare scar following treatment. Recommend Vaseline ointment to treated areas while healing.  Destruction Procedure Note Destruction method: cryotherapy   Informed consent: discussed and consent obtained   Lesion destroyed using liquid nitrogen: Yes   Outcome: patient tolerated procedure well with no complications   Post-procedure details: wound care instructions given   Locations: left temporal hairline x 1, vertex of scalp x 2, back x 2 # of Lesions Treated: 5   SEBORRHEIC KERATOSIS - Stuck-on, waxy, tan-brown papules and/or plaques  - Benign-appearing - Discussed benign etiology and prognosis. - Observe - Call for any changes  Lentigines - Scattered tan macules - Due to sun exposure - Benign-appearing, observe - Recommend daily broad spectrum sunscreen SPF 30+ to sun-exposed areas, reapply every 2 hours as needed. - Call for any changes   ACTINIC DAMAGE - chronic, secondary to cumulative UV radiation exposure/sun exposure over time - diffuse scaly erythematous macules with underlying dyspigmentation - Recommend daily broad spectrum sunscreen SPF 30+ to sun-exposed areas, reapply every 2 hours as needed.  - Recommend staying in the shade or wearing long sleeves, sun glasses (UVA+UVB protection) and wide brim hats (4-inch brim around the entire circumference of the hat). - Call for new or changing lesions.  Seborrheic dermatitis  Related Medications fluocinonide (LIDEX) 0.05 % external solution Apply to aa of scalp qd/bid prn for itch bumps  HISTORY OF PRECANCEROUS ACTINIC KERATOSIS At crown of scalp - site(s) of PreCancerous Actinic Keratosis clear today. - these may  recur and new lesions may form requiring treatment to prevent transformation into skin cancer - observe for new or changing spots and contact [place] for appointment if occur - photoprotection with sun  protective clothing; sunglasses and broad spectrum sunscreen with SPF of at least 30 + and frequent self skin exams recommended - yearly exams by a dermatologist recommended for persons with history of PreCancerous Actinic Keratoses   Return for tbse for september .  I, Ruthell Rummage, CMA, am acting as scribe for Brendolyn Patty, MD.   Documentation: I have reviewed the above documentation for accuracy and completeness, and I agree with the above.  Brendolyn Patty, MD

## 2022-07-08 ENCOUNTER — Other Ambulatory Visit: Payer: Self-pay | Admitting: Infectious Diseases

## 2022-07-08 DIAGNOSIS — Z1231 Encounter for screening mammogram for malignant neoplasm of breast: Secondary | ICD-10-CM

## 2022-08-11 ENCOUNTER — Other Ambulatory Visit
Admission: RE | Admit: 2022-08-11 | Discharge: 2022-08-11 | Disposition: A | Discharge status: Home / Self Care | Payer: Medicare Other | Source: Ambulatory Visit | Attending: Infectious Diseases | Admitting: Infectious Diseases

## 2022-08-11 ENCOUNTER — Ambulatory Visit
Admission: RE | Admit: 2022-08-11 | Discharge: 2022-08-11 | Disposition: A | Discharge status: Home / Self Care | Payer: Medicare Other | Source: Ambulatory Visit | Attending: Infectious Diseases | Admitting: Infectious Diseases

## 2022-08-11 DIAGNOSIS — I10 Essential (primary) hypertension: Secondary | ICD-10-CM | POA: Insufficient documentation

## 2022-08-11 DIAGNOSIS — Z01818 Encounter for other preprocedural examination: Secondary | ICD-10-CM | POA: Diagnosis not present

## 2022-08-11 DIAGNOSIS — E782 Mixed hyperlipidemia: Secondary | ICD-10-CM | POA: Diagnosis not present

## 2022-08-11 DIAGNOSIS — R0789 Other chest pain: Secondary | ICD-10-CM | POA: Diagnosis not present

## 2022-08-11 DIAGNOSIS — R0609 Other forms of dyspnea: Secondary | ICD-10-CM | POA: Diagnosis not present

## 2022-08-11 DIAGNOSIS — Z1231 Encounter for screening mammogram for malignant neoplasm of breast: Secondary | ICD-10-CM | POA: Diagnosis present

## 2022-08-11 LAB — CBC WITH DIFFERENTIAL/PLATELET
Abs Immature Granulocytes: 0.03 10*3/uL (ref 0.00–0.07)
Basophils Absolute: 0 10*3/uL (ref 0.0–0.1)
Basophils Relative: 0 %
Eosinophils Absolute: 0.1 10*3/uL (ref 0.0–0.5)
Eosinophils Relative: 2 %
HCT: 36.9 % (ref 36.0–46.0)
Hemoglobin: 12.3 g/dL (ref 12.0–15.0)
Immature Granulocytes: 1 %
Lymphocytes Relative: 17 %
Lymphs Abs: 1.1 10*3/uL (ref 0.7–4.0)
MCH: 28.9 pg (ref 26.0–34.0)
MCHC: 33.3 g/dL (ref 30.0–36.0)
MCV: 86.8 fL (ref 80.0–100.0)
Monocytes Absolute: 0.3 10*3/uL (ref 0.1–1.0)
Monocytes Relative: 5 %
Neutro Abs: 4.9 10*3/uL (ref 1.7–7.7)
Neutrophils Relative %: 75 %
Platelets: 182 10*3/uL (ref 150–400)
RBC: 4.25 MIL/uL (ref 3.87–5.11)
RDW: 13.5 % (ref 11.5–15.5)
WBC: 6.5 10*3/uL (ref 4.0–10.5)
nRBC: 0 % (ref 0.0–0.2)

## 2022-08-11 LAB — BASIC METABOLIC PANEL
Anion gap: 10 (ref 5–15)
BUN: 17 mg/dL (ref 8–23)
CO2: 24 mmol/L (ref 22–32)
Calcium: 9.3 mg/dL (ref 8.9–10.3)
Chloride: 102 mmol/L (ref 98–111)
Creatinine, Ser: 0.81 mg/dL (ref 0.44–1.00)
GFR, Estimated: >60 mL/min (ref >60)
Glucose, Bld: 199 mg/dL — ABNORMAL HIGH (ref 70–99)
Potassium: 4.4 mmol/L (ref 3.5–5.1)
Sodium: 136 mmol/L (ref 135–145)

## 2022-08-11 LAB — BRAIN NATRIURETIC PEPTIDE: B Natriuretic Peptide: 266.4 pg/mL — ABNORMAL HIGH (ref 0.0–100.0)

## 2022-08-12 ENCOUNTER — Encounter
Admission: RE | Disposition: A | Discharge status: Home / Self Care | Payer: Self-pay | Source: Home / Self Care | Attending: Internal Medicine

## 2022-08-12 ENCOUNTER — Other Ambulatory Visit: Payer: Self-pay

## 2022-08-12 ENCOUNTER — Ambulatory Visit
Admission: RE | Admit: 2022-08-12 | Discharge: 2022-08-12 | Disposition: A | Discharge status: Home / Self Care | Payer: Medicare Other | Attending: Internal Medicine | Admitting: Internal Medicine

## 2022-08-12 DIAGNOSIS — R079 Chest pain, unspecified: Secondary | ICD-10-CM

## 2022-08-12 DIAGNOSIS — I25110 Atherosclerotic heart disease of native coronary artery with unstable angina pectoris: Secondary | ICD-10-CM | POA: Insufficient documentation

## 2022-08-12 DIAGNOSIS — I2582 Chronic total occlusion of coronary artery: Secondary | ICD-10-CM | POA: Diagnosis not present

## 2022-08-12 DIAGNOSIS — R601 Generalized edema: Secondary | ICD-10-CM

## 2022-08-12 HISTORY — PX: RIGHT/LEFT HEART CATH AND CORONARY ANGIOGRAPHY: CATH118266

## 2022-08-12 LAB — POCT I-STAT EG7
Acid-base deficit: 3 mmol/L — ABNORMAL HIGH (ref 0.0–2.0)
Bicarbonate: 23.5 mmol/L (ref 20.0–28.0)
Calcium, Ion: 1.23 mmol/L (ref 1.15–1.40)
HCT: 32 % — ABNORMAL LOW (ref 36.0–46.0)
Hemoglobin: 10.9 g/dL — ABNORMAL LOW (ref 12.0–15.0)
O2 Saturation: 63 %
Potassium: 3.7 mmol/L (ref 3.5–5.1)
Sodium: 135 mmol/L (ref 135–145)
TCO2: 25 mmol/L (ref 22–32)
pCO2, Ven: 44.7 mmHg (ref 44–60)
pH, Ven: 7.329 (ref 7.25–7.43)
pO2, Ven: 35 mmHg (ref 32–45)

## 2022-08-12 LAB — POCT I-STAT 7, (LYTES, BLD GAS, ICA,H+H)
Acid-base deficit: 2 mmol/L (ref 0.0–2.0)
Bicarbonate: 23.2 mmol/L (ref 20.0–28.0)
Calcium, Ion: 1.24 mmol/L (ref 1.15–1.40)
HCT: 32 % — ABNORMAL LOW (ref 36.0–46.0)
Hemoglobin: 10.9 g/dL — ABNORMAL LOW (ref 12.0–15.0)
O2 Saturation: 92 %
Potassium: 3.9 mmol/L (ref 3.5–5.1)
Sodium: 139 mmol/L (ref 135–145)
TCO2: 24 mmol/L (ref 22–32)
pCO2 arterial: 38.1 mmHg (ref 32–48)
pH, Arterial: 7.392 (ref 7.35–7.45)
pO2, Arterial: 65 mmHg — ABNORMAL LOW (ref 83–108)

## 2022-08-12 LAB — CBC
HCT: 37 % (ref 36.0–46.0)
Hemoglobin: 11.6 g/dL — ABNORMAL LOW (ref 12.0–15.0)
MCH: 28.1 pg (ref 26.0–34.0)
MCHC: 31.4 g/dL (ref 30.0–36.0)
MCV: 89.6 fL (ref 80.0–100.0)
Platelets: 169 10*3/uL (ref 150–400)
RBC: 4.13 MIL/uL (ref 3.87–5.11)
RDW: 13.4 % (ref 11.5–15.5)
WBC: 5.2 10*3/uL (ref 4.0–10.5)
nRBC: 0 % (ref 0.0–0.2)

## 2022-08-12 LAB — BASIC METABOLIC PANEL
Anion gap: 10 (ref 5–15)
BUN: 20 mg/dL (ref 8–23)
CO2: 24 mmol/L (ref 22–32)
Calcium: 9.3 mg/dL (ref 8.9–10.3)
Chloride: 106 mmol/L (ref 98–111)
Creatinine, Ser: 0.9 mg/dL (ref 0.44–1.00)
GFR, Estimated: >60 mL/min (ref >60)
Glucose, Bld: 190 mg/dL — ABNORMAL HIGH (ref 70–99)
Potassium: 4.2 mmol/L (ref 3.5–5.1)
Sodium: 140 mmol/L (ref 135–145)

## 2022-08-12 LAB — BRAIN NATRIURETIC PEPTIDE: B Natriuretic Peptide: 247.2 pg/mL — ABNORMAL HIGH (ref 0.0–100.0)

## 2022-08-12 SURGERY — RIGHT/LEFT HEART CATH AND CORONARY ANGIOGRAPHY
Anesthesia: Moderate Sedation | Laterality: Bilateral

## 2022-08-12 MED ORDER — HEPARIN SODIUM (PORCINE) 1000 UNIT/ML IJ SOLN
INTRAMUSCULAR | Status: AC
  Filled 2022-08-12: qty 10

## 2022-08-12 MED ORDER — ASPIRIN 81 MG PO CHEW
CHEWABLE_TABLET | ORAL | Status: AC
  Filled 2022-08-12: qty 1

## 2022-08-12 MED ORDER — FENTANYL CITRATE (PF) 100 MCG/2ML IJ SOLN
INTRAMUSCULAR | Status: AC
  Filled 2022-08-12: qty 2

## 2022-08-12 MED ORDER — LABETALOL HCL 5 MG/ML IV SOLN: 10.0000 mg | INTRAVENOUS | Status: DC | PRN

## 2022-08-12 MED ORDER — MIDAZOLAM HCL 2 MG/2ML IJ SOLN
INTRAMUSCULAR | Status: AC
  Filled 2022-08-12: qty 2

## 2022-08-12 MED ORDER — ACETAMINOPHEN 325 MG PO TABS: 650.0000 mg | ORAL_TABLET | ORAL | Status: DC | PRN

## 2022-08-12 MED ORDER — LIDOCAINE HCL 1 % IJ SOLN
INTRAMUSCULAR | Status: AC
  Filled 2022-08-12: qty 20

## 2022-08-12 MED ORDER — VERAPAMIL HCL 2.5 MG/ML IV SOLN
INTRAVENOUS | Status: AC
  Filled 2022-08-12: qty 2

## 2022-08-12 MED ORDER — HEPARIN (PORCINE) IN NACL 1000-0.9 UT/500ML-% IV SOLN
INTRAVENOUS | Status: AC
  Filled 2022-08-12: qty 1000

## 2022-08-12 MED ORDER — SODIUM CHLORIDE 0.9 % WEIGHT BASED INFUSION
1.0000 mL/kg/h | INTRAVENOUS | Status: DC
  Administered 2022-08-12: 1 mL/kg/h via INTRAVENOUS

## 2022-08-12 MED ORDER — HEPARIN (PORCINE) IN NACL 2000-0.9 UNIT/L-% IV SOLN
INTRAVENOUS | Status: DC | PRN
  Administered 2022-08-12: 1000 mL

## 2022-08-12 MED ORDER — VERAPAMIL HCL 2.5 MG/ML IV SOLN
INTRAVENOUS | Status: DC | PRN
  Administered 2022-08-12: 2.5 mg via INTRAVENOUS

## 2022-08-12 MED ORDER — FENTANYL CITRATE (PF) 100 MCG/2ML IJ SOLN
INTRAMUSCULAR | Status: DC | PRN
  Administered 2022-08-12: 25 ug via INTRAVENOUS

## 2022-08-12 MED ORDER — ROSUVASTATIN CALCIUM 20 MG PO TABS
40.0000 mg | ORAL_TABLET | Freq: Every day | ORAL | Status: DC
  Administered 2022-08-12: 40 mg via ORAL
  Filled 2022-08-12: qty 2

## 2022-08-12 MED ORDER — SODIUM CHLORIDE 0.9% FLUSH: 3.0000 mL | INTRAVENOUS | Status: DC | PRN

## 2022-08-12 MED ORDER — SODIUM CHLORIDE 0.9% FLUSH: 3.0000 mL | Freq: Two times a day (BID) | INTRAVENOUS | Status: DC

## 2022-08-12 MED ORDER — MIDAZOLAM HCL 2 MG/2ML IJ SOLN
INTRAMUSCULAR | Status: DC | PRN
  Administered 2022-08-12: 1 mg via INTRAVENOUS

## 2022-08-12 MED ORDER — SODIUM CHLORIDE 0.9 % IV SOLN: 250.0000 mL | INTRAVENOUS | Status: DC | PRN

## 2022-08-12 MED ORDER — SODIUM CHLORIDE 0.9 % WEIGHT BASED INFUSION
3.0000 mL/kg/h | INTRAVENOUS | Status: AC
  Administered 2022-08-12: 3 mL/kg/h via INTRAVENOUS

## 2022-08-12 MED ORDER — IOHEXOL 300 MG/ML  SOLN
INTRAMUSCULAR | Status: DC | PRN
  Administered 2022-08-12: 78 mL

## 2022-08-12 MED ORDER — LIDOCAINE HCL (PF) 1 % IJ SOLN
INTRAMUSCULAR | Status: DC | PRN
  Administered 2022-08-12: 2 mL

## 2022-08-12 MED ORDER — ASPIRIN 81 MG PO CHEW
81.0000 mg | CHEWABLE_TABLET | ORAL | Status: AC
  Administered 2022-08-12: 81 mg via ORAL

## 2022-08-12 MED ORDER — HYDRALAZINE HCL 20 MG/ML IJ SOLN: 10.0000 mg | INTRAMUSCULAR | Status: DC | PRN

## 2022-08-12 MED ORDER — HEPARIN SODIUM (PORCINE) 1000 UNIT/ML IJ SOLN
INTRAMUSCULAR | Status: DC | PRN
  Administered 2022-08-12: 3000 [IU] via INTRAVENOUS

## 2022-08-12 MED ORDER — ONDANSETRON HCL 4 MG/2ML IJ SOLN: 4.0000 mg | Freq: Four times a day (QID) | INTRAMUSCULAR | Status: DC | PRN

## 2022-08-12 MED ORDER — ISOSORBIDE MONONITRATE ER 30 MG PO TB24
30.0000 mg | ORAL_TABLET | Freq: Every day | ORAL | Status: DC
  Administered 2022-08-12: 30 mg via ORAL
  Filled 2022-08-12: qty 1

## 2022-08-12 SURGICAL SUPPLY — 14 items
CATH 5FR JL3.5 JR4 ANG PIG MP (CATHETERS) IMPLANT
CATH BALLN WEDGE 5F 110CM (CATHETERS) IMPLANT
DEVICE RAD TR BAND REGULAR (VASCULAR PRODUCTS) IMPLANT
DRAPE BRACHIAL (DRAPES) IMPLANT
GLIDESHEATH SLEND SS 6F .021 (SHEATH) IMPLANT
GUIDEWIRE INQWIRE 1.5J.035X260 (WIRE) IMPLANT
INQWIRE 1.5J .035X260CM (WIRE) ×1
KIT SYRINGE INJ CVI SPIKEX1 (MISCELLANEOUS) IMPLANT
PACK CARDIAC CATH (CUSTOM PROCEDURE TRAY) ×1 IMPLANT
PROTECTION STATION PRESSURIZED (MISCELLANEOUS) ×1
SET ATX-X65L (MISCELLANEOUS) IMPLANT
SHEATH GLIDE SLENDER 4/5FR (SHEATH) IMPLANT
STATION PROTECTION PRESSURIZED (MISCELLANEOUS) IMPLANT
WIRE HITORQ VERSACORE ST 145CM (WIRE) IMPLANT

## 2022-08-12 NOTE — Progress Notes (Signed)
No rx or follow up orders placed after cath. Dr. Juliann Pares was unavailable when pt was to be discharged. Checked with him and he was ok with pt going to follow up appt with Dr. Melton Alar on 8/26 that is already scheduled. He stated that [facility] would contact pt to schedule follow up. Dr. Juliann Pares and his team was made aware of no rx orders being in. He was ok with pt being discharged prior to rx orders being placed. Informed pt that rx was going to be ordered for Imdur and Crestor. Informed her to call her pharmacy tomorrow about med.

## 2022-08-15 ENCOUNTER — Encounter: Payer: Self-pay | Admitting: Internal Medicine

## 2022-09-15 ENCOUNTER — Encounter: Payer: Medicare Other | Attending: Internal Medicine

## 2022-09-15 DIAGNOSIS — E109 Type 1 diabetes mellitus without complications: Secondary | ICD-10-CM | POA: Insufficient documentation

## 2022-09-15 DIAGNOSIS — Z48812 Encounter for surgical aftercare following surgery on the circulatory system: Secondary | ICD-10-CM | POA: Insufficient documentation

## 2022-09-15 DIAGNOSIS — Z951 Presence of aortocoronary bypass graft: Secondary | ICD-10-CM | POA: Insufficient documentation

## 2022-09-15 DIAGNOSIS — Z713 Dietary counseling and surveillance: Secondary | ICD-10-CM | POA: Insufficient documentation

## 2022-09-15 NOTE — Progress Notes (Signed)
Virtual Visit completed. Patient informed on EP and RD appointment and 6 Minute walk test. Patient also informed of patient health questionnaires on My Chart. Patient Verbalizes understanding. Visit diagnosis can be found in [facility] 08/23/2022.

## 2022-09-20 ENCOUNTER — Encounter: Payer: Self-pay | Admitting: Dermatology

## 2022-09-20 ENCOUNTER — Ambulatory Visit (INDEPENDENT_AMBULATORY_CARE_PROVIDER_SITE_OTHER): Payer: Medicare Other | Admitting: Dermatology

## 2022-09-20 VITALS — BP 103/62 | HR 73

## 2022-09-20 DIAGNOSIS — Z872 Personal history of diseases of the skin and subcutaneous tissue: Secondary | ICD-10-CM

## 2022-09-20 DIAGNOSIS — L814 Other melanin hyperpigmentation: Secondary | ICD-10-CM

## 2022-09-20 DIAGNOSIS — L905 Scar conditions and fibrosis of skin: Secondary | ICD-10-CM

## 2022-09-20 DIAGNOSIS — L578 Other skin changes due to chronic exposure to nonionizing radiation: Secondary | ICD-10-CM | POA: Diagnosis not present

## 2022-09-20 DIAGNOSIS — D229 Melanocytic nevi, unspecified: Secondary | ICD-10-CM

## 2022-09-20 DIAGNOSIS — L219 Seborrheic dermatitis, unspecified: Secondary | ICD-10-CM

## 2022-09-20 DIAGNOSIS — D1801 Hemangioma of skin and subcutaneous tissue: Secondary | ICD-10-CM

## 2022-09-20 DIAGNOSIS — Z85828 Personal history of other malignant neoplasm of skin: Secondary | ICD-10-CM

## 2022-09-20 DIAGNOSIS — L821 Other seborrheic keratosis: Secondary | ICD-10-CM

## 2022-09-20 DIAGNOSIS — W908XXA Exposure to other nonionizing radiation, initial encounter: Secondary | ICD-10-CM

## 2022-09-20 DIAGNOSIS — Z86018 Personal history of other benign neoplasm: Secondary | ICD-10-CM

## 2022-09-20 NOTE — Patient Instructions (Signed)
Melanoma ABCDEs  Melanoma is the most dangerous type of skin cancer, and is the leading cause of death from skin disease.  You are more likely to develop melanoma if you: Have light-colored skin, light-colored eyes, or red or blond hair Spend a lot of time in the sun Tan regularly, either outdoors or in a tanning bed Have had blistering sunburns, especially during childhood Have a close family member who has had a melanoma Have atypical moles or large birthmarks  Early detection of melanoma is key since treatment is typically straightforward and cure rates are extremely high if we catch it early.   The first sign of melanoma is often a change in a mole or a new dark spot.  The ABCDE system is a way of remembering the signs of melanoma.  A for asymmetry:  The two halves do not match. B for border:  The edges of the growth are irregular. C for color:  A mixture of colors are present instead of an even brown color. D for diameter:  Melanomas are usually (but not always) greater than 6mm - the size of a pencil eraser. E for evolution:  The spot keeps changing in size, shape, and color.  Please check your skin once per month between visits. You can use a small mirror in front and a large mirror behind you to keep an eye on the back side or your body.   If you see any new or changing lesions before your next follow-up, please call to schedule a visit.  Please continue daily skin protection including broad spectrum sunscreen SPF 30+ to sun-exposed areas, reapplying every 2 hours as needed when you're outdoors.   Staying in the shade or wearing long sleeves, sun glasses (UVA+UVB protection) and wide brim hats (4-inch brim around the entire circumference of the hat) are also recommended for sun protection.    Due to recent changes in healthcare laws, you may see results of your pathology and/or laboratory studies on MyChart before the doctors have had a chance to review them. We understand that in  some cases there may be results that are confusing or concerning to you. Please understand that not all results are received at the same time and often the doctors may need to interpret multiple results in order to provide you with the best plan of care or course of treatment. Therefore, we ask that you please give [place] 2 business days to thoroughly review all your results before contacting the office for clarification. Should we see a critical lab result, you will be contacted sooner.   If You Need Anything After Your Visit  If you have any questions or concerns for your doctor, please call our main line at [phone] and press option 4 to reach your doctor's medical assistant. If no one answers, please leave a voicemail as directed and we will return your call as soon as possible. Messages left after 4 pm will be answered the following business day.   You may also send [place] a message via MyChart. We typically respond to MyChart messages within 1-2 business days.  For prescription refills, please ask your pharmacy to contact our office. Our fax number is [phone].  If you have an urgent issue when the clinic is closed that cannot wait until the next business day, you can page your doctor at the number below.    Please note that while we do our best to be available for urgent issues outside of office hours, we  are not available 24/7.   If you have an urgent issue and are unable to reach [place], you may choose to seek medical care at your doctor's office, retail clinic, urgent care center, or emergency room.  If you have a medical emergency, please immediately call 911 or go to the emergency department.  Pager Numbers  - Dr. Gwen Pounds: [phone]  - Dr. Roseanne Reno: [phone]  - Dr. Katrinka Blazing: [phone]   In the event of inclement weather, please call our main line at [phone] for an update on the status of any delays or closures.  Dermatology Medication Tips: Please keep the boxes that  topical medications come in in order to help keep track of the instructions about where and how to use these. Pharmacies typically print the medication instructions only on the boxes and not directly on the medication tubes.   If your medication is too expensive, please contact our office at [phone] option 4 or send [place] a message through MyChart.   We are unable to tell what your co-pay for medications will be in advance as this is different depending on your insurance coverage. However, we may be able to find a substitute medication at lower cost or fill out paperwork to get insurance to cover a needed medication.   If a prior authorization is required to get your medication covered by your insurance company, please allow [place] 1-2 business days to complete this process.  Drug prices often vary depending on where the prescription is filled and some pharmacies may offer cheaper prices.  The website www.goodrx.com contains coupons for medications through different pharmacies. The prices here do not account for what the cost may be with help from insurance (it may be cheaper with your insurance), but the website can give you the price if you did not use any insurance.  - You can print the associated coupon and take it with your prescription to the pharmacy.  - You may also stop by our office during regular business hours and pick up a GoodRx coupon card.  - If you need your prescription sent electronically to a different pharmacy, notify our office through [facility] or by phone at [phone] option 4.     Si Usted Necesita Algo Despus de Su Visita  Tambin puede enviarnos un mensaje a travs de Clinical cytogeneticist. Por lo general respondemos a los mensajes de MyChart en el transcurso de 1 a 2 das hbiles.  Para renovar recetas, por favor pida a su farmacia que se ponga en contacto con nuestra oficina. Annie Sable de fax es [place] [phone].  Si tiene un asunto urgente cuando la clnica  est cerrada y que no puede esperar hasta el siguiente da hbil, puede llamar/localizar a su doctor(a) al nmero que aparece a continuacin.   Por favor, tenga en cuenta que aunque hacemos todo lo posible para estar disponibles para asuntos urgentes fuera del horario de [place], no estamos disponibles las 24 horas del da, los 7 [address]  [address] de la [place].   Si tiene un problema urgente y no puede comunicarse con nosotros, puede optar por buscar atencin mdica  en el consultorio de su doctor(a), en una clnica privada, en un centro de atencin urgente o en una sala de emergencias.  Si tiene Engineer, drilling, por favor llame inmediatamente al 911 o vaya a la sala de emergencias.  Nmeros de bper  - Dr. Gwen Pounds: [phone]  - Dra. Roseanne Reno: [phone]  - Dr. Katrinka Blazing: [phone]   En caso de inclemencias del  tiempo, por favor llame a [place] lnea principal al [phone] para una actualizacin sobre el [place] de cualquier retraso o cierre.  Consejos para la medicacin en dermatologa: Por favor, guarde las cajas en las que vienen los medicamentos de uso tpico para ayudarle a seguir las instrucciones sobre dnde y cmo usarlos. Las farmacias generalmente imprimen las instrucciones del medicamento slo en las cajas y no directamente en los tubos del [place].   Si su medicamento es muy caro, por favor, pngase en contacto con Rolm [place]ala llamando al [phone] y presione la opcin 4 o envenos un mensaje a travs de Clinical cytogeneticist.   No podemos decirle cul ser su copago por los medicamentos por adelantado ya que esto es diferente dependiendo de la cobertura de su seguro. Sin embargo, es posible que podamos encontrar un medicamento sustituto a Audiological scientist un formulario para que el seguro cubra el medicamento que se considera necesario.   Si se requiere una autorizacin previa para que su compaa de seguros [place] su medicamento, por favor permtanos de 1 a 2 das hbiles para  completar [address]  Los precios de los medicamentos varan con frecuencia dependiendo del Environmental consultant de dnde se surte la receta y alguna farmacias pueden ofrecer precios ms baratos.  El sitio web www.goodrx.com tiene cupones para medicamentos de Health and safety inspector. Los precios aqu no tienen en cuenta lo que podra costar con la ayuda del seguro (puede ser ms barato con su seguro), pero el sitio web puede darle el precio si no utiliz Tourist information centre manager.  - Puede imprimir el cupn correspondiente y llevarlo con su receta a la farmacia.  - Tambin puede pasar por nuestra oficina durante el horario de atencin regular y Education officer, museum una tarjeta de cupones de GoodRx.  - Si necesita que su receta se enve electrnicamente a una farmacia diferente, informe a nuestra oficina a travs de MyChart de [place] o por telfono llamando al [phone] y presione la opcin 4.

## 2022-09-20 NOTE — Progress Notes (Signed)
Follow-Up Visit   Subjective  Cassandra Joyce is a 77 y.o. female who presents for the following:  Patient here today for spot check. 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  Hx of bcc, hx of dysplastic, hx of seb derm, hx of aks, hx of isks.   Husband is with patient and contributes to history.  The patient has spots, moles and lesions to be evaluated, some may be new or changing and the patient may have concern these could be cancer.   The following portions of the chart were reviewed this encounter and updated as appropriate: medications, allergies, medical history  Review of Systems:  No other skin or systemic complaints except as noted in HPI or Assessment and Plan.  Objective  Well appearing patient in no apparent distress; mood and affect are within normal limits.   A focused examination was performed of the following areas: arms, legs, back, chest, face, scalp, ears, hands, abdomen    Relevant exam findings are noted in the Assessment and Plan.    Assessment & Plan   ACTINIC DAMAGE - chronic, secondary to cumulative UV radiation exposure/sun exposure over time - diffuse scaly erythematous macules with underlying dyspigmentation - Recommend daily broad spectrum sunscreen SPF 30+ to sun-exposed areas, reapply every 2 hours as needed.  - Recommend staying in the shade or wearing long sleeves, sun glasses (UVA+UVB protection) and wide brim hats (4-inch brim around the entire circumference of the hat). - Call for new or changing lesions.  SEBORRHEIC KERATOSIS - Stuck-on, waxy, tan-brown papules and/or plaques  - Benign-appearing - Discussed benign etiology and prognosis. - Observe - Call for any changes  HEMANGIOMA Exam: red papule(s) Discussed benign nature. Recommend observation. Call for changes.  MELANOCYTIC NEVI Exam: Tan-brown and/or pink-flesh-colored symmetric macules and papules  Treatment Plan: Benign appearing on exam  today. Recommend observation. Call clinic for new or changing moles. Recommend daily use of broad spectrum spf 30+ sunscreen to sun-exposed areas.   LENTIGINES Exam: scattered tan macules Due to sun exposure Treatment Plan: Benign-appearing, observe. Recommend daily broad spectrum sunscreen SPF 30+ to sun-exposed areas, reapply every 2 hours as needed.  Call for any changes  HISTORY OF DYSPLASTIC NEVUS See history  No evidence of recurrence today Recommend regular full body skin exams Recommend daily broad spectrum sunscreen SPF 30+ to sun-exposed areas, reapply every 2 hours as needed.  Call if any new or changing lesions are noted between office visits  HISTORY OF BASAL CELL CARCINOMA OF THE SKIN See history - No evidence of recurrence today - Recommend regular full body skin exams - Recommend daily broad spectrum sunscreen SPF 30+ to sun-exposed areas, reapply every 2 hours as needed.  - Call if any new or changing lesions are noted between office visits  SEBORRHEIC DERMATITIS Exam: focal areas of pink scaliness at scalp   Chronic and persistent condition with duration or expected duration over one year. Condition is symptomatic/ bothersome to patient. Not currently at goal. Still gets itchy bumps off and on   Seborrheic Dermatitis  -  is a chronic persistent rash characterized by pinkness and scaling most commonly of the mid face but also can occur on the scalp (dandruff), ears; mid chest, mid back and groin.  It tends to be exacerbated by stress and cooler weather.  People who have neurologic disease may experience new onset or exacerbation of existing seborrheic dermatitis.  The condition is not curable but treatable and can  be controlled.   Treatment Plan: Can continue as needed for flares fluocinonide 0.05 % solution - apply to aa of scalp qd/bid prn for itchy bumps   Continue  OTC Head & Shoulders shampoo 2-3x per week, massage into scalp and let sit 3-5 minutes before  rinsing. 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  Observation.  Call clinic for new or changing lesions. Recommend daily broad spectrum sunscreen SPF 30+, reapply every 2 hours as needed. Treatment:  Recommend vaseline to scar after surgeon okays  Recommend Serica moisturizing scar formula cream every night or [facility] brand or Mederma silicone scar sheet every night for the first year after a scar appears to help with scar remodeling if desired. Scars remodel on their own for a full year and will gradually improve in appearance over time.     Return for 6 month tbse .  I, Asher Muir, CMA, am acting as scribe for Willeen Niece, MD.   Documentation: I have reviewed the above documentation for accuracy and completeness, and I agree with the above.  Willeen Niece, MD

## 2022-09-26 ENCOUNTER — Encounter: Payer: Medicare Other | Admitting: *Deleted

## 2022-09-26 VITALS — Ht 66.0 in | Wt 125.9 lb

## 2022-09-26 DIAGNOSIS — Z713 Dietary counseling and surveillance: Secondary | ICD-10-CM | POA: Diagnosis not present

## 2022-09-26 DIAGNOSIS — Z951 Presence of aortocoronary bypass graft: Secondary | ICD-10-CM

## 2022-09-26 DIAGNOSIS — E109 Type 1 diabetes mellitus without complications: Secondary | ICD-10-CM | POA: Diagnosis not present

## 2022-09-26 DIAGNOSIS — Z48812 Encounter for surgical aftercare following surgery on the circulatory system: Secondary | ICD-10-CM | POA: Diagnosis present

## 2022-09-26 NOTE — Patient Instructions (Signed)
Patient Instructions  Patient Details  Name: Cassandra Joyce MRN: [id number] Date of Birth: Dec 27, 1945 Referring Provider:  Alwyn Pea, MD  Below are your personal goals for exercise, nutrition, and risk factors. Our goal is to help you stay on track towards obtaining and maintaining these goals. We will be discussing your progress on these goals with you throughout the program.  Initial Exercise Prescription:  Initial Exercise Prescription - 09/26/22 1500       Date of Initial Exercise RX and Referring Provider   Date 09/26/22    Referring Provider Dr. Dorothyann Peng      Oxygen   Maintain Oxygen Saturation 88% or higher      Treadmill   MPH 2    Grade 1    Minutes 15    METs 2.86      NuStep   Level 2   T6   SPM 80    Minutes 15    METs 2.81      REL-XR   Level 2    Speed 50    Minutes 15    METs 2.81      Biostep-RELP   Level 2    SPM 50    Minutes 15    METs 2.81      Track   Laps 30    Minutes 15    METs 2.63      Prescription Details   Frequency (times per week) 3    Duration Progress to 30 minutes of continuous aerobic without signs/symptoms of physical distress      Intensity   THRR 40-80% of Max Heartrate 99-127    Ratings of Perceived Exertion 11-13    Perceived Dyspnea 0-4      Progression   Progression Continue to progress workloads to maintain intensity without signs/symptoms of physical distress.      Resistance Training   Training Prescription Yes    Weight 1    Reps 10-15             Exercise Goals: Frequency: Be able to perform aerobic exercise two to three times per week in program working toward 2-5 days per week of home exercise.  Intensity: Work with a perceived exertion of 11 (fairly light) - 15 (hard) while following your exercise prescription.  We will make changes to your prescription with you as you progress through the program.   Duration: Be able to do 30 to 45 minutes of continuous aerobic exercise in  addition to a 5 minute warm-up and a 5 minute cool-down routine.   Nutrition Goals: Your personal nutrition goals will be established when you do your nutrition analysis with the dietician.  The following are general nutrition guidelines to follow: Cholesterol < 200mg /day Sodium < 1500mg /day Fiber: Women over 50 yrs - 21 grams per day  Personal Goals:  Personal Goals and Risk Factors at Admission - 09/26/22 1549       Core Components/Risk Factors/Patient Goals on Admission    Weight Management Weight Maintenance    Intervention Weight Management: Develop a combined nutrition and exercise program designed to reach desired caloric intake, while maintaining appropriate intake of nutrient and fiber, sodium and fats, and appropriate energy expenditure required for the weight goal.;Weight Management: Provide education and appropriate resources to help participant work on and attain dietary goals.;Weight Management/Obesity: Establish reasonable short term and long term weight goals.    Expected Outcomes Short Term: Continue to assess and modify interventions until short term weight is  achieved;Long Term: Adherence to nutrition and physical activity/exercise program aimed toward attainment of established weight goal;Understanding recommendations for meals to include 15-35% energy as protein, 25-35% energy from fat, 35-60% energy from carbohydrates, less than 200mg  of dietary cholesterol, 20-35 gm of total fiber daily;Understanding of distribution of calorie intake throughout the day with the consumption of 4-5 meals/snacks;Weight Maintenance: Understanding of the daily nutrition guidelines, which includes 25-35% calories from fat, 7% or less cal from saturated fats, less than 200mg  cholesterol, less than 1.5gm of sodium, & 5 or more servings of fruits and vegetables daily    Diabetes Yes    Intervention Provide education about signs/symptoms and action to take for hypo/hyperglycemia.;Provide education  about proper nutrition, including hydration, and aerobic/resistive exercise prescription along with prescribed medications to achieve blood glucose in normal ranges: Fasting glucose 65-99 mg/dL    Expected Outcomes Short Term: Participant verbalizes understanding of the signs/symptoms and immediate care of hyper/hypoglycemia, proper foot care and importance of medication, aerobic/resistive exercise and nutrition plan for blood glucose control.;Long Term: Attainment of HbA1C < 7%.    Hypertension Yes    Intervention Provide education on lifestyle modifcations including regular physical activity/exercise, weight management, moderate sodium restriction and increased consumption of fresh fruit, vegetables, and low fat dairy, alcohol moderation, and smoking cessation.;Monitor prescription use compliance.    Expected Outcomes Short Term: Continued assessment and intervention until BP is < 140/78mm HG in hypertensive participants. < 130/95mm HG in hypertensive participants with diabetes, heart failure or chronic kidney disease.;Long Term: Maintenance of blood pressure at goal levels.    Lipids Yes    Intervention Provide education and support for participant on nutrition & aerobic/resistive exercise along with prescribed medications to achieve LDL 70mg , HDL >40mg .    Expected Outcomes Short Term: Participant states understanding of desired cholesterol values and is compliant with medications prescribed. Participant is following exercise prescription and nutrition guidelines.;Long Term: Cholesterol controlled with medications as prescribed, with individualized exercise RX and with personalized nutrition plan. Value goals: LDL < 70mg , HDL > 40 mg.             Tobacco Use Initial Evaluation: Social History   Tobacco Use  Smoking Status Never  Smokeless Tobacco Never    Exercise Goals and Review:  Exercise Goals     Row Name 09/26/22 1546             Exercise Goals   Increase Physical Activity  Yes       Intervention Provide advice, education, support and counseling about physical activity/exercise needs.;Develop an individualized exercise prescription for aerobic and resistive training based on initial evaluation findings, risk stratification, comorbidities and participant's personal goals.       Expected Outcomes Short Term: Attend rehab on a regular basis to increase amount of physical activity.;Long Term: Add in home exercise to make exercise part of routine and to increase amount of physical activity.;Long Term: Exercising regularly at least 3-5 days a week.       Increase Strength and Stamina Yes       Intervention Provide advice, education, support and counseling about physical activity/exercise needs.;Develop an individualized exercise prescription for aerobic and resistive training based on initial evaluation findings, risk stratification, comorbidities and participant's personal goals.       Expected Outcomes Short Term: Increase workloads from initial exercise prescription for resistance, speed, and METs.;Short Term: Perform resistance training exercises routinely during rehab and add in resistance training at home;Long Term: Improve cardiorespiratory fitness, muscular endurance and strength as  measured by increased METs and functional capacity ( )       Able to understand and use rate of perceived exertion (RPE) scale Yes       Intervention Provide education and explanation on how to use RPE scale       Expected Outcomes Short Term: Able to use RPE daily in rehab to express subjective intensity level;Long Term:  Able to use RPE to guide intensity level when exercising independently       Able to understand and use Dyspnea scale Yes       Intervention Provide education and explanation on how to use Dyspnea scale       Expected Outcomes Short Term: Able to use Dyspnea scale daily in rehab to express subjective sense of shortness of breath during exertion;Long Term: Able to use  Dyspnea scale to guide intensity level when exercising independently       Knowledge and understanding of Target Heart Rate Range (THRR) Yes       Intervention Provide education and explanation of THRR including how the numbers were predicted and where they are located for reference       Expected Outcomes Short Term: Able to state/look up THRR;Long Term: Able to use THRR to govern intensity when exercising independently;Short Term: Able to use daily as guideline for intensity in rehab       Able to check pulse independently Yes       Intervention Provide education and demonstration on how to check pulse in carotid and radial arteries.;Review the importance of being able to check your own pulse for safety during independent exercise       Expected Outcomes Short Term: Able to explain why pulse checking is important during independent exercise;Long Term: Able to check pulse independently and accurately       Understanding of Exercise Prescription Yes       Intervention Provide education, explanation, and written materials on patient's individual exercise prescription       Expected Outcomes Short Term: Able to explain program exercise prescription;Long Term: Able to explain home exercise prescription to exercise independently                Copy of goals given to participant.

## 2022-09-26 NOTE — Progress Notes (Signed)
Assessment start time: 2:45 PM  Comorbidities T1DM, uses insulin pump  24-hours Recall: B: cheerios, blueberries L: cheese crackers, grapes D: roast beef, potatoes  Intake Patterns Misses meal sometimes, often only eats half of her plate.   Education r/t nutrition plan Patient reports her appetite has gone down a lot over the past ~3-29months. She says that she often only eats half her plate, says she doses her insulin pump before eating. Recently she has dosed her insulin for the entire meal then only been able to eat half. Spoke to her about set out portions she can eat, even if small. Educated on the smaller portions of nutrient dense foods and how she could meet goals even with her lesser appetite. Tuna, cheese, peanut butter, beans, lentils, protein shake, chicken, avocado, ect as some examples of foods with lots of nutrition in smaller quantities. Reviewed heart healthy guidelines, but main goals are on feeding her body with her lesser appetite, choosing calorie dense foods, aiming for smaller more frequent meals and making sure blood sugars stay stable, not dropping due to too much insulin and not enough carbs. Educated on simple sugars and complex carbs, encouraged pairing carbs with protein or healthy fat. Built out several meals and snacks with foods she likes and will eat, focusing small portions, calorie dense foods, adequate protein and blood sugar control.   Goal 1: Eat 3-5 small meal/snacks per day Goal 2: Measure carbs per meal, dose insulin and pair carbs with protein or healthy fats Goal 3: Try greek yogurt or protein shake  End time 3:57 PM

## 2022-09-26 NOTE — Progress Notes (Signed)
Cardiac Individual Treatment Plan  Patient Details  Name: Cassandra Joyce MRN: [id number] Date of Birth: 04-15-1945 Referring Provider:   Flowsheet Row Cardiac Rehab from 09/26/2022 in [facility] Cardiac and Pulmonary Rehab  Referring Provider Dr. Dorothyann Peng       Initial Encounter Date:  Flowsheet Row Cardiac Rehab from 09/26/2022 in [facility] Cardiac and Pulmonary Rehab  Date 09/26/22       Visit Diagnosis: S/P CABG x 3  Patient's Home Medications on Admission:  Current Outpatient Medications:    acetaminophen (TYLENOL) 500 MG tablet, Take 2 tablets (1,000 mg total) by mouth every 8 (eight) hours. (Patient not taking: Reported on 08/12/2022), Disp: 30 tablet, Rfl: 0   Alpha-Lipoic Acid 200 MG TABS, Take by mouth daily. (Patient not taking: Reported on 09/15/2022), Disp: , Rfl:    aspirin (ASPIRIN 81) 81 MG chewable tablet, , Disp: , Rfl:    ASPIRIN 81 PO, Take by mouth daily. (Patient not taking: Reported on 09/15/2022), Disp: , Rfl:    Azelastine HCl 137 MCG/SPRAY SOLN, SMARTSIG:1-2 Spray(s) Both Nares Every 12 Hours PRN (Patient not taking: Reported on 09/15/2022), Disp: , Rfl:    B Complex Vitamins (VITAMIN B COMPLEX) TABS, Take 1 tablet by mouth daily. (Patient not taking: Reported on 09/20/2022), Disp: , Rfl:    Calcium Carb-Cholecalciferol (CALCIUM HIGH POTENCY/VITAMIN D) 600-5 MG-MCG TABS, Take by mouth., Disp: , Rfl:    calcium carbonate (OSCAL) 1500 (600 Ca) MG TABS tablet, Take by mouth 2 (two) times daily with a meal. (Patient not taking: Reported on 09/15/2022), Disp: , Rfl:    Cholecalciferol 1.25 MG (50000 UT) TABS, , Disp: , Rfl:    clindamycin (CLEOCIN) 150 MG capsule, TAKE 4 CAPS 1HR PRIOR TO DENTAL APPT WITH FOOD (Patient not taking: Reported on 09/15/2022), Disp: , Rfl:    Continuous Glucose Sensor (DEXCOM G6 SENSOR) MISC, Use 1 each every 10 (ten) days (Patient not taking: Reported on 09/15/2022), Disp: , Rfl:    Continuous Glucose Sensor (DEXCOM G7 SENSOR) MISC, Use 1 Device  every 10 (ten) days (Patient not taking: Reported on 09/15/2022), Disp: , Rfl:    Continuous Glucose Transmitter (DEXCOM G6 TRANSMITTER) MISC, See admin instructions., Disp: , Rfl:    cyanocobalamin 1000 MCG tablet, Take 1,000 mcg by mouth daily. (Patient not taking: Reported on 09/15/2022), Disp: , Rfl:    enalapril (VASOTEC) 2.5 MG tablet, Take 2.5 mg by mouth. (Patient not taking: Reported on 09/15/2022), Disp: , Rfl:    Enoxaparin Sodium (LOVENOX IJ), Lovenox (Patient not taking: Reported on 09/15/2022), Disp: , Rfl:    Enoxaparin Sodium (LOVENOX IJ), , Disp: , Rfl:    fluocinonide (LIDEX) 0.05 % external solution, Apply to aa of scalp qd/bid prn for itch bumps (Patient not taking: Reported on 09/15/2022), Disp: 60 mL, Rfl: 3   fluticasone (FLONASE) 50 MCG/ACT nasal spray, Place 2 sprays into both nostrils daily. (Patient not taking: Reported on 09/15/2022), Disp: , Rfl:    furosemide (LASIX) 20 MG tablet, PLEASE SEE ATTACHED FOR DETAILED DIRECTIONS (Patient not taking: Reported on 09/15/2022), Disp: , Rfl:    furosemide (LASIX) 40 MG tablet, Take by mouth., Disp: , Rfl:    hydrochlorothiazide (HYDRODIURIL) 12.5 MG tablet, Take 12.5 mg by mouth daily. (Patient not taking: Reported on 09/15/2022), Disp: , Rfl:    HYDROcodone-acetaminophen (NORCO/VICODIN) 5-325 MG tablet, 1 PO Q 6hrs prn pain (Patient not taking: Reported on 09/15/2022), Disp: , Rfl:    influenza vaccine adjuvanted (FLUAD) 0.5 ML injection, Fluad 2016-17  65yr up(PF)45 mcg(15 mcgx3)/0.5 mL intramuscular syringe  ADM 0.5ML IM UTD (Patient not taking: Reported on 09/15/2022), Disp: , Rfl:    influenza vaccine adjuvanted (FLUAD) 0.5 ML injection, ADM 0.5ML IM UTD (Patient not taking: Reported on 09/15/2022), Disp: , Rfl:    insulin lispro (HUMALOG) 100 UNIT/ML injection, Inject 30 Units into the skin once. Pt uses insulin pump-uses 100 units every 3 days, Disp: , Rfl:    isosorbide mononitrate (IMDUR) 30 MG 24 hr tablet, Take 30 mg by mouth  daily., Disp: , Rfl:    KLOR-CON M10 10 MEQ tablet, PLEASE SEE ATTACHED FOR DETAILED DIRECTIONS (Patient not taking: Reported on 09/15/2022), Disp: , Rfl:    KLOR-CON M20 20 MEQ tablet, Take by mouth. (Patient not taking: Reported on 09/15/2022), Disp: , Rfl:    metoCLOPramide (REGLAN) 10 MG tablet, Take 10 mg by mouth 3 (three) times daily., Disp: , Rfl:    metolazone (ZAROXOLYN) 2.5 MG tablet, Take by mouth. (Patient not taking: Reported on 09/15/2022), Disp: , Rfl:    metoprolol succinate (TOPROL-XL) 25 MG 24 hr tablet, Take 12.5 mg by mouth at bedtime., Disp: , Rfl:    metoprolol tartrate (LOPRESSOR) 25 MG tablet, Take 25 mg by mouth 2 (two) times daily., Disp: , Rfl:    mometasone (NASONEX) 50 MCG/ACT nasal spray, Place 2 sprays into the nose daily. (Patient not taking: Reported on 09/15/2022), Disp: , Rfl:    nitroGLYCERIN (NITROSTAT) 0.4 MG SL tablet, PLEASE SEE ATTACHED FOR DETAILED DIRECTIONS, Disp: , Rfl:    omeprazole (PRILOSEC) 40 MG capsule, Take 40 mg by mouth daily., Disp: , Rfl:    ondansetron (ZOFRAN) 4 MG tablet, Take 2 tablets twice a day by oral route. (Patient not taking: Reported on 09/15/2022), Disp: , Rfl:    ondansetron (ZOFRAN-ODT) 4 MG disintegrating tablet, Take 4 mg by mouth every 8 (eight) hours as needed. (Patient not taking: Reported on 09/15/2022), Disp: , Rfl:    oxyCODONE (OXY IR/ROXICODONE) 5 MG immediate release tablet, Take 5 mg by mouth every 6 (six) hours as needed. (Patient not taking: Reported on 09/15/2022), Disp: , Rfl:    polyethylene glycol (MIRALAX / GLYCOLAX) 17 g packet, Take by mouth., Disp: , Rfl:    Potassium Chloride ER 20 MEQ TBCR, Take by mouth., Disp: , Rfl:    rosuvastatin (CRESTOR) 40 MG tablet, Take by mouth., Disp: , Rfl:    senna-docusate (SENOKOT-S) 8.6-50 MG tablet, Take by mouth., Disp: , Rfl:    Sennosides (SENNA) 8.6 MG CAPS, senna (Patient not taking: Reported on 09/15/2022), Disp: , Rfl:    Sennosides (SENNA-TIME PO), senna (Patient not  taking: Reported on 09/15/2022), Disp: , Rfl:    Sennosides (SENNA-TIME PO), senna (Patient not taking: Reported on 09/15/2022), Disp: , Rfl:    Sennosides (SENNA-TIME PO), , Disp: , Rfl:    simvastatin (ZOCOR) 40 MG tablet, Take 40 mg by mouth daily. (Patient not taking: Reported on 09/15/2022), Disp: , Rfl:    triamcinolone ointment (KENALOG) 0.1 %, Apply topically 2 (two) times daily. (Patient not taking: Reported on 09/15/2022), Disp: , Rfl:   Past Medical History: Past Medical History:  Diagnosis Date   Actinic keratosis 09/28/2020   upper sternum, bx   Barrett's esophagus    Basal cell carcinoma 11/13/2007   Left nasal root. Excised 12/07/2007, margins free.   Cancer (HCC)    skin   Diabetes mellitus type 1 (HCC)    Medtronic Medimed Insulin pump   Dysplastic nevus 04/07/2009  RLQA. Mild to moderate atypia, edges free.   Essential (primary) hypertension    Female dyspareunia    GERD (gastroesophageal reflux disease)    Barret's esphagus   Hematuria    Hyperlipidemia    Hypertension    Impaired hearing    left ear hearing loss, tinnitis   Menopause    Microscopic colitis    Nasal polyps    Osteoarthritis    right hip, back   Seasonal allergies    Sinusitis    Vertigo    none recently   Wears hearing aid in both ears     Tobacco Use: Social History   Tobacco Use  Smoking Status Never  Smokeless Tobacco Never    Labs: Review Flowsheet       Latest Ref Rng & Units 03/26/2015 08/03/2015 08/12/2022  Labs for ITP Cardiac and Pulmonary Rehab  Hemoglobin A1c 4.8 - 5.6 % 6.8  7.2  -  PH, Arterial 7.35 - 7.45 - - 7.392   PCO2 arterial 32 - 48 mmHg - - 38.1   Bicarbonate 20.0 - 28.0 mmol/L - - 23.2  23.5   TCO2 22 - 32 mmol/L - - 24  25   Acid-base deficit 0.0 - 2.0 mmol/L - - 2.0  3.0   O2 Saturation % - - 92  63     Details       Multiple values from one day are sorted in reverse-chronological order          Exercise Target Goals: Exercise Program  Goal: Individual exercise prescription set using results from initial 6 min walk test and THRR while considering  patient's activity barriers and safety.   Exercise Prescription Goal: Initial exercise prescription builds to 30-45 minutes a day of aerobic activity, 2-3 days per week.  Home exercise guidelines will be given to patient during program as part of exercise prescription that the participant will acknowledge.   Education: Aerobic Exercise: - Group verbal and visual presentation on the components of exercise prescription. Introduces F.I.T.T principle from ACSM for exercise prescriptions.  Reviews F.I.T.T. principles of aerobic exercise including progression. Written material given at graduation.   Education: Resistance Exercise: - Group verbal and visual presentation on the components of exercise prescription. Introduces F.I.T.T principle from ACSM for exercise prescriptions  Reviews F.I.T.T. principles of resistance exercise including progression. Written material given at graduation.    Education: Exercise & Equipment Safety: - Individual verbal instruction and demonstration of equipment use and safety with use of the equipment. Flowsheet Row Cardiac Rehab from 09/26/2022 in [facility] Cardiac and Pulmonary Rehab  Date 09/26/22  Educator [facility]  Instruction Review Code 1- Verbalizes Understanding       Education: Exercise Physiology & General Exercise Guidelines: - Group verbal and written instruction with models to review the exercise physiology of the cardiovascular system and associated critical values. Provides general exercise guidelines with specific guidelines to those with heart or lung disease.    Education: Flexibility, Balance, Mind/Body Relaxation: - Group verbal and visual presentation with interactive activity on the components of exercise prescription. Introduces F.I.T.T principle from ACSM for exercise prescriptions. Reviews F.I.T.T. principles of flexibility and balance  exercise training including progression. Also discusses the mind body connection.  Reviews various relaxation techniques to help reduce and manage stress (i.e. Deep breathing, progressive muscle relaxation, and visualization). Balance handout provided to take home. Written material given at graduation.   Activity Barriers & Risk Stratification:  Activity Barriers & Cardiac Risk Stratification - 09/26/22 1529  Activity Barriers & Cardiac Risk Stratification   Activity Barriers Other (comment)    Comments still under lifting restrictions post op and history of right hip fracture and repair (currently no pain just limited ROM)    Cardiac Risk Stratification High             6 Minute Walk:  6 Minute Walk     Row Name 09/26/22 1528         6 Minute Walk   Phase Initial     Distance 1300 feet     Walk Time 6 minutes     # of Rest Breaks 0     MPH 2.46     METS 2.81     RPE 12     Perceived Dyspnea  1     VO2 Peak 9.85     Symptoms No     Resting HR 72 bpm     Resting BP 110/62     Resting Oxygen Saturation  99 %     Exercise Oxygen Saturation  during 6 min walk 99 %     Max Ex. HR 97 bpm     Max Ex. BP 138/70     2 Minute Post BP 118/70              Oxygen Initial Assessment:   Oxygen Re-Evaluation:   Oxygen Discharge (Final Oxygen Re-Evaluation):   Initial Exercise Prescription:  Initial Exercise Prescription - 09/26/22 1500       Date of Initial Exercise RX and Referring Provider   Date 09/26/22    Referring Provider Dr. Dorothyann Peng      Oxygen   Maintain Oxygen Saturation 88% or higher      Treadmill   MPH 2    Grade 1    Minutes 15    METs 2.86      NuStep   Level 2   T6   SPM 80    Minutes 15    METs 2.81      REL-XR   Level 2    Speed 50    Minutes 15    METs 2.81      Biostep-RELP   Level 2    SPM 50    Minutes 15    METs 2.81      Track   Laps 30    Minutes 15    METs 2.63      Prescription Details    Frequency (times per week) 3    Duration Progress to 30 minutes of continuous aerobic without signs/symptoms of physical distress      Intensity   THRR 40-80% of Max Heartrate 99-127    Ratings of Perceived Exertion 11-13    Perceived Dyspnea 0-4      Progression   Progression Continue to progress workloads to maintain intensity without signs/symptoms of physical distress.      Resistance Training   Training Prescription Yes    Weight 1    Reps 10-15             Perform Capillary Blood Glucose checks as needed.  Exercise Prescription Changes:   Exercise Prescription Changes     Row Name 09/26/22 1500             Response to Exercise   Blood Pressure (Admit) 110/62       Blood Pressure (Exercise) 138/70       Blood Pressure (Exit) 118/70       Heart  Rate (Admit) 72 bpm       Heart Rate (Exercise) 97 bpm       Heart Rate (Exit) 77 bpm       Oxygen Saturation (Admit) 99 %       Oxygen Saturation (Exercise) 99 %       Oxygen Saturation (Exit) 98 %       Rating of Perceived Exertion (Exercise) 12       Perceived Dyspnea (Exercise) 1       Symptoms none       Comments 6 MWT results                Exercise Comments:   Exercise Goals and Review:   Exercise Goals     Row Name 09/26/22 1546             Exercise Goals   Increase Physical Activity Yes       Intervention Provide advice, education, support and counseling about physical activity/exercise needs.;Develop an individualized exercise prescription for aerobic and resistive training based on initial evaluation findings, risk stratification, comorbidities and participant's personal goals.       Expected Outcomes Short Term: Attend rehab on a regular basis to increase amount of physical activity.;Long Term: Add in home exercise to make exercise part of routine and to increase amount of physical activity.;Long Term: Exercising regularly at least 3-5 days a week.       Increase Strength and Stamina Yes        Intervention Provide advice, education, support and counseling about physical activity/exercise needs.;Develop an individualized exercise prescription for aerobic and resistive training based on initial evaluation findings, risk stratification, comorbidities and participant's personal goals.       Expected Outcomes Short Term: Increase workloads from initial exercise prescription for resistance, speed, and METs.;Short Term: Perform resistance training exercises routinely during rehab and add in resistance training at home;Long Term: Improve cardiorespiratory fitness, muscular endurance and strength as measured by increased METs and functional capacity ( )       Able to understand and use rate of perceived exertion (RPE) scale Yes       Intervention Provide education and explanation on how to use RPE scale       Expected Outcomes Short Term: Able to use RPE daily in rehab to express subjective intensity level;Long Term:  Able to use RPE to guide intensity level when exercising independently       Able to understand and use Dyspnea scale Yes       Intervention Provide education and explanation on how to use Dyspnea scale       Expected Outcomes Short Term: Able to use Dyspnea scale daily in rehab to express subjective sense of shortness of breath during exertion;Long Term: Able to use Dyspnea scale to guide intensity level when exercising independently       Knowledge and understanding of Target Heart Rate Range (THRR) Yes       Intervention Provide education and explanation of THRR including how the numbers were predicted and where they are located for reference       Expected Outcomes Short Term: Able to state/look up THRR;Long Term: Able to use THRR to govern intensity when exercising independently;Short Term: Able to use daily as guideline for intensity in rehab       Able to check pulse independently Yes       Intervention Provide education and demonstration on how to check pulse in carotid  and radial arteries.;Review the  importance of being able to check your own pulse for safety during independent exercise       Expected Outcomes Short Term: Able to explain why pulse checking is important during independent exercise;Long Term: Able to check pulse independently and accurately       Understanding of Exercise Prescription Yes       Intervention Provide education, explanation, and written materials on patient's individual exercise prescription       Expected Outcomes Short Term: Able to explain program exercise prescription;Long Term: Able to explain home exercise prescription to exercise independently                Exercise Goals Re-Evaluation :   Discharge Exercise Prescription (Final Exercise Prescription Changes):  Exercise Prescription Changes - 09/26/22 1500       Response to Exercise   Blood Pressure (Admit) 110/62    Blood Pressure (Exercise) 138/70    Blood Pressure (Exit) 118/70    Heart Rate (Admit) 72 bpm    Heart Rate (Exercise) 97 bpm    Heart Rate (Exit) 77 bpm    Oxygen Saturation (Admit) 99 %    Oxygen Saturation (Exercise) 99 %    Oxygen Saturation (Exit) 98 %    Rating of Perceived Exertion (Exercise) 12    Perceived Dyspnea (Exercise) 1    Symptoms none    Comments 6 MWT results             Nutrition:  Target Goals: Understanding of nutrition guidelines, daily intake of sodium 1500mg , cholesterol 200mg , calories 30% from fat and 7% or less from saturated fats, daily to have 5 or more servings of fruits and vegetables.  Education: All About Nutrition: -Group instruction provided by verbal, written material, interactive activities, discussions, models, and posters to present general guidelines for heart healthy nutrition including fat, fiber, MyPlate, the role of sodium in heart healthy nutrition, utilization of the nutrition label, and utilization of this knowledge for meal planning. Follow up email sent as well. Written material given at  graduation. Flowsheet Row Cardiac Rehab from 09/26/2022 in [facility] Cardiac and Pulmonary Rehab  Education need identified 09/26/22       Biometrics:  Pre Biometrics - 09/26/22 1547       Pre Biometrics   Height 5\' 6"  (1.676 m)    Weight 125 lb 14.4 oz (57.1 kg)    Waist Circumference 31 inches    Hip Circumference 39.5 inches    Waist to Hip Ratio 0.78 %    BMI (Calculated) 20.33    Single Leg Stand 5.62 seconds              Nutrition Therapy Plan and Nutrition Goals:   Nutrition Assessments:  MEDIFICTS Score Key: >=70 Need to make dietary changes  40-70 Heart Healthy Diet <= 40 Therapeutic Level Cholesterol Diet  Flowsheet Row Cardiac Rehab from 09/26/2022 in [facility] Cardiac and Pulmonary Rehab  Picture Your Plate Total Score on Admission 60      Picture Your Plate Scores: <22 Unhealthy dietary pattern with much room for improvement. 41-50 Dietary pattern unlikely to meet recommendations for good health and room for improvement. 51-60 More healthful dietary pattern, with some room for improvement.  >60 Healthy dietary pattern, although there may be some specific behaviors that could be improved.    Nutrition Goals Re-Evaluation:   Nutrition Goals Discharge (Final Nutrition Goals Re-Evaluation):   Psychosocial: Target Goals: Acknowledge presence or absence of significant depression and/or stress, maximize coping skills, provide positive  support system. Participant is able to verbalize types and ability to use techniques and skills needed for reducing stress and depression.   Education: Stress, Anxiety, and Depression - Group verbal and visual presentation to define topics covered.  Reviews how body is impacted by stress, anxiety, and depression.  Also discusses healthy ways to reduce stress and to treat/manage anxiety and depression.  Written material given at graduation.   Education: Sleep Hygiene -Provides group verbal and written instruction about how sleep  can affect your health.  Define sleep hygiene, discuss sleep cycles and impact of sleep habits. Review good sleep hygiene tips.    Initial Review & Psychosocial Screening:  Initial Psych Review & Screening - 09/15/22 1024       Initial Review   Current issues with None Identified      Family Dynamics   Good Support System? Yes    Comments She can look to her husband for support. Joule is feeling better now that she is getting further away from surgery. She is ready to start rehab and get more strength.      Barriers   Psychosocial barriers to participate in program The patient should benefit from training in stress management and relaxation.;There are no identifiable barriers or psychosocial needs.      Screening Interventions   Interventions Encouraged to exercise;To provide support and resources with identified psychosocial needs;Provide feedback about the scores to participant    Expected Outcomes Short Term goal: Utilizing psychosocial counselor, staff and physician to assist with identification of specific Stressors or current issues interfering with healing process. Setting desired goal for each stressor or current issue identified.;Long Term Goal: Stressors or current issues are controlled or eliminated.;Short Term goal: Identification and review with participant of any Quality of Life or Depression concerns found by scoring the questionnaire.;Long Term goal: The participant improves quality of Life and PHQ9 Scores as seen by post scores and/or verbalization of changes             Quality of Life Scores:   Quality of Life - 09/26/22 1548       Quality of Life   Select Quality of Life      Quality of Life Scores   Health/Function Pre 19.6 %    Socioeconomic Pre 22.13 %    Psych/Spiritual Pre 29.14 %    Family Pre 23.4 %    GLOBAL Pre 22.63 %            Scores of 19 and below usually indicate a poorer quality of life in these areas.  A difference of  2-3 points is a  clinically meaningful difference.  A difference of 2-3 points in the total score of the Quality of Life Index has been associated with significant improvement in overall quality of life, self-image, physical symptoms, and general health in studies assessing change in quality of life.  PHQ-9: Review Flowsheet       09/26/2022  Depression screen PHQ 2/9  Decreased Interest 1  Down, Depressed, Hopeless 1  PHQ - 2 Score 2  Altered sleeping 1  Tired, decreased energy 2  Change in appetite 1  Feeling bad or failure about yourself  0  Trouble concentrating 0  Moving slowly or fidgety/restless 0  Suicidal thoughts 0  PHQ-9 Score 6  Difficult doing work/chores Somewhat difficult    Details           Interpretation of Total Score  Total Score Depression Severity:  1-4 = Minimal depression,  5-9 = Mild depression, 10-14 = Moderate depression, 15-19 = Moderately severe depression, 20-27 = Severe depression   Psychosocial Evaluation and Intervention:  Psychosocial Evaluation - 09/15/22 1025       Psychosocial Evaluation & Interventions   Interventions Encouraged to exercise with the program and follow exercise prescription;Stress management education;Relaxation education    Comments She can look to her husband for support. Clintonia is feeling better now that she is getting further away from surgery. She is ready to start rehab and get more strength.    Expected Outcomes Short: Start HeartTrack to help with mood. Long: Maintain a healthy mental state    Continue Psychosocial Services  Follow up required by staff             Psychosocial Re-Evaluation:   Psychosocial Discharge (Final Psychosocial Re-Evaluation):   Vocational Rehabilitation: Provide vocational rehab assistance to qualifying candidates.   Vocational Rehab Evaluation & Intervention:   Education: Education Goals: Education classes will be provided on a variety of topics geared toward better understanding of heart  health and risk factor modification. Participant will state understanding/return demonstration of topics presented as noted by education test scores.  Learning Barriers/Preferences:  Learning Barriers/Preferences - 09/15/22 1021       Learning Barriers/Preferences   Learning Barriers None    Learning Preferences None             General Cardiac Education Topics:  AED/CPR: - Group verbal and written instruction with the use of models to demonstrate the basic use of the AED with the basic ABC's of resuscitation.   Anatomy and Cardiac Procedures: - Group verbal and visual presentation and models provide information about basic cardiac anatomy and function. Reviews the testing methods done to diagnose heart disease and the outcomes of the test results. Describes the treatment choices: Medical Management, Angioplasty, or Coronary Bypass Surgery for treating various heart conditions including Myocardial Infarction, Angina, Valve Disease, and Cardiac Arrhythmias.  Written material given at graduation.   Medication Safety: - Group verbal and visual instruction to review commonly prescribed medications for heart and lung disease. Reviews the medication, class of the drug, and side effects. Includes the steps to properly store meds and maintain the prescription regimen.  Written material given at graduation.   Intimacy: - Group verbal instruction through game format to discuss how heart and lung disease can affect sexual intimacy. Written material given at graduation..   Know Your Numbers and Heart Failure: - Group verbal and visual instruction to discuss disease risk factors for cardiac and pulmonary disease and treatment options.  Reviews associated critical values for Overweight/Obesity, Hypertension, Cholesterol, and Diabetes.  Discusses basics of heart failure: signs/symptoms and treatments.  Introduces Heart Failure Zone chart for action plan for heart failure.  Written material given  at graduation.   Infection Prevention: - Provides verbal and written material to individual with discussion of infection control including proper hand washing and proper equipment cleaning during exercise session. Flowsheet Row Cardiac Rehab from 09/26/2022 in [facility] Cardiac and Pulmonary Rehab  Date 09/26/22  Educator [facility]  Instruction Review Code 1- Verbalizes Understanding       Falls Prevention: - Provides verbal and written material to individual with discussion of falls prevention and safety. Flowsheet Row Cardiac Rehab from 09/26/2022 in [facility] Cardiac and Pulmonary Rehab  Date 09/26/22  Educator [facility]  Instruction Review Code 1- Verbalizes Understanding       Other: -Provides group and verbal instruction on various topics (see comments)  Knowledge Questionnaire Score:  Knowledge Questionnaire Score - 09/26/22 1549       Knowledge Questionnaire Score   Pre Score 25/26             Core Components/Risk Factors/Patient Goals at Admission:  Personal Goals and Risk Factors at Admission - 09/26/22 1549       Core Components/Risk Factors/Patient Goals on Admission    Weight Management Weight Maintenance    Intervention Weight Management: Develop a combined nutrition and exercise program designed to reach desired caloric intake, while maintaining appropriate intake of nutrient and fiber, sodium and fats, and appropriate energy expenditure required for the weight goal.;Weight Management: Provide education and appropriate resources to help participant work on and attain dietary goals.;Weight Management/Obesity: Establish reasonable short term and long term weight goals.    Expected Outcomes Short Term: Continue to assess and modify interventions until short term weight is achieved;Long Term: Adherence to nutrition and physical activity/exercise program aimed toward attainment of established weight goal;Understanding recommendations for meals to include 15-35% energy as protein,  25-35% energy from fat, 35-60% energy from carbohydrates, less than 200mg  of dietary cholesterol, 20-35 gm of total fiber daily;Understanding of distribution of calorie intake throughout the day with the consumption of 4-5 meals/snacks;Weight Maintenance: Understanding of the daily nutrition guidelines, which includes 25-35% calories from fat, 7% or less cal from saturated fats, less than 200mg  cholesterol, less than 1.5gm of sodium, & 5 or more servings of fruits and vegetables daily    Diabetes Yes    Intervention Provide education about signs/symptoms and action to take for hypo/hyperglycemia.;Provide education about proper nutrition, including hydration, and aerobic/resistive exercise prescription along with prescribed medications to achieve blood glucose in normal ranges: Fasting glucose 65-99 mg/dL    Expected Outcomes Short Term: Participant verbalizes understanding of the signs/symptoms and immediate care of hyper/hypoglycemia, proper foot care and importance of medication, aerobic/resistive exercise and nutrition plan for blood glucose control.;Long Term: Attainment of HbA1C < 7%.    Hypertension Yes    Intervention Provide education on lifestyle modifcations including regular physical activity/exercise, weight management, moderate sodium restriction and increased consumption of fresh fruit, vegetables, and low fat dairy, alcohol moderation, and smoking cessation.;Monitor prescription use compliance.    Expected Outcomes Short Term: Continued assessment and intervention until BP is < 140/78mm HG in hypertensive participants. < 130/66mm HG in hypertensive participants with diabetes, heart failure or chronic kidney disease.;Long Term: Maintenance of blood pressure at goal levels.    Lipids Yes    Intervention Provide education and support for participant on nutrition & aerobic/resistive exercise along with prescribed medications to achieve LDL 70mg , HDL >40mg .    Expected Outcomes Short Term:  Participant states understanding of desired cholesterol values and is compliant with medications prescribed. Participant is following exercise prescription and nutrition guidelines.;Long Term: Cholesterol controlled with medications as prescribed, with individualized exercise RX and with personalized nutrition plan. Value goals: LDL < 70mg , HDL > 40 mg.             Education:Diabetes - Individual verbal and written instruction to review signs/symptoms of diabetes, desired ranges of glucose level fasting, after meals and with exercise. Acknowledge that pre and post exercise glucose checks will be done for 3 sessions at entry of program. Flowsheet Row Cardiac Rehab from 09/26/2022 in [facility] Cardiac and Pulmonary Rehab  Date 09/26/22  Educator [facility]  Instruction Review Code 1- Verbalizes Understanding       Core Components/Risk Factors/Patient Goals Review:    Core Components/Risk Factors/Patient  Goals at Discharge (Final Review):    ITP Comments:  ITP Comments     Row Name 09/15/22 1019 09/26/22 1527         ITP Comments Virtual Visit completed. Patient informed on EP and RD appointment and 6 Minute walk test. Patient also informed of patient health questionnaires on My Chart. Patient Verbalizes understanding. Visit diagnosis can be found in [facility] 08/23/2022. Completed and gym orientation. Initial ITP created and sent for review to Dr. Bethann Punches, Medical Director.               Comments: initial ITP

## 2022-09-28 DIAGNOSIS — Z951 Presence of aortocoronary bypass graft: Secondary | ICD-10-CM

## 2022-09-28 LAB — GLUCOSE, CAPILLARY
Glucose-Capillary: 67 mg/dL — ABNORMAL LOW (ref 70–99)
Glucose-Capillary: 70 mg/dL (ref 70–99)

## 2022-09-28 NOTE — Progress Notes (Signed)
Incomplete Session Note  Patient Details  Name: Cassandra Joyce MRN: [id number] Date of Birth: September 26, 1945 Referring Provider:   Flowsheet Row Cardiac Rehab from 09/26/2022 in [facility] Cardiac and Pulmonary Rehab  Referring Provider Dr. Dorothyann Peng       Jehan W Joyce did not complete her rehab session.  Joanmarie's CBG was below the range to exercise. She was given glucose gel as treatment and it did not increase to above the 110 that is needed to exercise. She was asymptomatic. Education provided to her and her husband on what to eat and insulin use prior to exercise.

## 2022-09-29 DIAGNOSIS — Z951 Presence of aortocoronary bypass graft: Secondary | ICD-10-CM

## 2022-09-29 DIAGNOSIS — Z48812 Encounter for surgical aftercare following surgery on the circulatory system: Secondary | ICD-10-CM | POA: Diagnosis not present

## 2022-09-29 LAB — GLUCOSE, CAPILLARY
Glucose-Capillary: 124 mg/dL — ABNORMAL HIGH (ref 70–99)
Glucose-Capillary: 128 mg/dL — ABNORMAL HIGH (ref 70–99)

## 2022-09-29 NOTE — Progress Notes (Signed)
Daily Session Note  Patient Details  Name: Cassandra Joyce MRN: [id number] Date of Birth: [birth year] Referring Provider:   Flowsheet Row Cardiac Rehab from 09/26/2022 in [facility] Cardiac and Pulmonary Rehab  Referring Provider Dr. Dorothyann Peng       Encounter Date: 09/29/2022  Check In:  Session Check In - 09/29/22 1703       Check-In   Supervising physician immediately available to respond to emergencies See telemetry face sheet for immediately available ER MD    Location [facility]-Cardiac & Pulmonary Rehab    Staff Present Maxon Suzzette Righter, , Exercise Physiologist;Javarius Tsosie Jewel Baize, RN BSN;Joseph Reino Kent, RCP,RRT,BSRT    Virtual Visit No    Medication changes reported     No    Fall or balance concerns reported    No    Warm-up and Cool-down Performed on first and last piece of equipment    Resistance Training Performed Yes    VAD Patient? No    PAD/SET Patient? No      Pain Assessment   Currently in Pain? No/denies                Social History   Tobacco Use  Smoking Status Never  Smokeless Tobacco Never    Goals Met:  Independence with exercise equipment Exercise tolerated well No report of concerns or symptoms today Strength training completed today  Goals Unmet:  Not Applicable  Comments: First full day of exercise!  Patient was oriented to gym and equipment including functions, settings, policies, and procedures.  Patient's individual exercise prescription and treatment plan were reviewed.  All starting workloads were established based on the results of the 6 minute walk test done at initial orientation visit.  The plan for exercise progression was also introduced and progression will be customized based on patient's performance and goals.    Dr. Bethann Punches is Medical Director for [facility] Cardiac Rehabilitation.  Dr. Vida Rigger is Medical Director for [facility] Pulmonary Rehabilitation.

## 2022-10-03 ENCOUNTER — Encounter: Payer: Medicare Other | Admitting: *Deleted

## 2022-10-03 DIAGNOSIS — Z951 Presence of aortocoronary bypass graft: Secondary | ICD-10-CM

## 2022-10-03 DIAGNOSIS — Z48812 Encounter for surgical aftercare following surgery on the circulatory system: Secondary | ICD-10-CM | POA: Diagnosis not present

## 2022-10-03 LAB — GLUCOSE, CAPILLARY
Glucose-Capillary: 175 mg/dL — ABNORMAL HIGH (ref 70–99)
Glucose-Capillary: 111 mg/dL — ABNORMAL HIGH (ref 70–99)

## 2022-10-03 NOTE — Progress Notes (Signed)
Daily Session Note  Patient Details  Name: Cassandra Joyce MRN: [id number] Date of Birth: [birth year] Referring Provider:   Flowsheet Row Cardiac Rehab from 09/26/2022 in [facility] Cardiac and Pulmonary Rehab  Referring Provider Dr. Dorothyann Peng       Encounter Date: 10/03/2022  Check In:  Session Check In - 10/03/22 1604       Check-In   Supervising physician immediately available to respond to emergencies See telemetry face sheet for immediately available ER MD    Location [facility]-Cardiac & Pulmonary Rehab    Staff Present Maxon Suzzette Righter, , Exercise Physiologist;Kadance Mccuistion Jewel Baize, RN BSN;Joseph Reino Kent, RCP,RRT,BSRT    Virtual Visit No    Medication changes reported     No    Fall or balance concerns reported    No    Warm-up and Cool-down Performed on first and last piece of equipment    Resistance Training Performed Yes    VAD Patient? No    PAD/SET Patient? No      Pain Assessment   Currently in Pain? No/denies                Social History   Tobacco Use  Smoking Status Never  Smokeless Tobacco Never    Goals Met:  Independence with exercise equipment Exercise tolerated well No report of concerns or symptoms today Strength training completed today  Goals Unmet:  Not Applicable  Comments: Pt able to follow exercise prescription today without complaint.  Will continue to monitor for progression.'   Dr. Bethann Punches is Medical Director for [facility] Cardiac Rehabilitation.  Dr. Vida Rigger is Medical Director for [facility] Pulmonary Rehabilitation.

## 2022-10-05 ENCOUNTER — Encounter: Payer: Medicare Other | Attending: Internal Medicine | Admitting: *Deleted

## 2022-10-05 ENCOUNTER — Encounter: Payer: Self-pay | Admitting: *Deleted

## 2022-10-05 DIAGNOSIS — Z951 Presence of aortocoronary bypass graft: Secondary | ICD-10-CM

## 2022-10-05 DIAGNOSIS — Z48812 Encounter for surgical aftercare following surgery on the circulatory system: Secondary | ICD-10-CM | POA: Insufficient documentation

## 2022-10-05 LAB — GLUCOSE, CAPILLARY
Glucose-Capillary: 131 mg/dL — ABNORMAL HIGH (ref 70–99)
Glucose-Capillary: 108 mg/dL — ABNORMAL HIGH (ref 70–99)

## 2022-10-05 NOTE — Progress Notes (Signed)
Daily Session Note  Patient Details  Name: Cassandra Joyce MRN: [id number] Date of Birth: Nov 01, 1945 Referring Provider:   Flowsheet Row Cardiac Rehab from 09/26/2022 in [facility] Cardiac and Pulmonary Rehab  Referring Provider Dr. Dorothyann Peng       Encounter Date: 10/05/2022  Check In:  Session Check In - 10/05/22 1615       Check-In   Supervising physician immediately available to respond to emergencies See telemetry face sheet for immediately available ER MD    Location [facility]-Cardiac & Pulmonary Rehab    Staff Present Maxon Suzzette Righter, , Exercise Physiologist;Tyneka Scafidi Jewel Baize, RN BSN;Joseph Reino Kent, RCP,RRT,BSRT    Virtual Visit No    Medication changes reported     No    Fall or balance concerns reported    No    Warm-up and Cool-down Performed on first and last piece of equipment    Resistance Training Performed Yes    VAD Patient? No    PAD/SET Patient? No      Pain Assessment   Currently in Pain? No/denies                Social History   Tobacco Use  Smoking Status Never  Smokeless Tobacco Never    Goals Met:  Independence with exercise equipment Exercise tolerated well No report of concerns or symptoms today Strength training completed today  Goals Unmet:  Not Applicable  Comments: Pt able to follow exercise prescription today without complaint.  Will continue to monitor for progression.    Dr. Bethann Punches is Medical Director for [facility] Cardiac Rehabilitation.  Dr. Vida Rigger is Medical Director for [facility] Pulmonary Rehabilitation.

## 2022-10-05 NOTE — Progress Notes (Signed)
Cardiac Individual Treatment Plan  Patient Details  Name: Cassandra Joyce MRN: [id number] Date of Birth: [birth year] Referring Provider:   Flowsheet Row Cardiac Rehab from 09/26/2022 in [facility] Cardiac and Pulmonary Rehab  Referring Provider Dr. Dorothyann Peng       Initial Encounter Date:  Flowsheet Row Cardiac Rehab from 09/26/2022 in [facility] Cardiac and Pulmonary Rehab  Date 09/26/22       Visit Diagnosis: S/P CABG x 3  Patient's Home Medications on Admission:  Current Outpatient Medications:    acetaminophen (TYLENOL) 500 MG tablet, Take 2 tablets (1,000 mg total) by mouth every 8 (eight) hours. (Patient not taking: Reported on 08/12/2022), Disp: 30 tablet, Rfl: 0   Alpha-Lipoic Acid 200 MG TABS, Take by mouth daily. (Patient not taking: Reported on 09/15/2022), Disp: , Rfl:    aspirin (ASPIRIN 81) 81 MG chewable tablet, , Disp: , Rfl:    ASPIRIN 81 PO, Take by mouth daily. (Patient not taking: Reported on 09/15/2022), Disp: , Rfl:    Azelastine HCl 137 MCG/SPRAY SOLN, SMARTSIG:1-2 Spray(s) Both Nares Every 12 Hours PRN (Patient not taking: Reported on 09/15/2022), Disp: , Rfl:    B Complex Vitamins (VITAMIN B COMPLEX) TABS, Take 1 tablet by mouth daily. (Patient not taking: Reported on 09/20/2022), Disp: , Rfl:    Calcium Carb-Cholecalciferol (CALCIUM HIGH POTENCY/VITAMIN D) 600-5 MG-MCG TABS, Take by mouth., Disp: , Rfl:    calcium carbonate (OSCAL) 1500 (600 Ca) MG TABS tablet, Take by mouth 2 (two) times daily with a meal. (Patient not taking: Reported on 09/15/2022), Disp: , Rfl:    Cholecalciferol 1.25 MG (50000 UT) TABS, , Disp: , Rfl:    clindamycin (CLEOCIN) 150 MG capsule, TAKE 4 CAPS 1HR PRIOR TO DENTAL APPT WITH FOOD (Patient not taking: Reported on 09/15/2022), Disp: , Rfl:    Continuous Glucose Sensor (DEXCOM G6 SENSOR) MISC, Use 1 each every 10 (ten) days (Patient not taking: Reported on 09/15/2022), Disp: , Rfl:    Continuous Glucose Sensor (DEXCOM G7 SENSOR) MISC, Use 1 Device  every 10 (ten) days (Patient not taking: Reported on 09/15/2022), Disp: , Rfl:    Continuous Glucose Transmitter (DEXCOM G6 TRANSMITTER) MISC, See admin instructions., Disp: , Rfl:    cyanocobalamin 1000 MCG tablet, Take 1,000 mcg by mouth daily. (Patient not taking: Reported on 09/15/2022), Disp: , Rfl:    enalapril (VASOTEC) 2.5 MG tablet, Take 2.5 mg by mouth. (Patient not taking: Reported on 09/15/2022), Disp: , Rfl:    Enoxaparin Sodium (LOVENOX IJ), Lovenox (Patient not taking: Reported on 09/15/2022), Disp: , Rfl:    Enoxaparin Sodium (LOVENOX IJ), , Disp: , Rfl:    fluocinonide (LIDEX) 0.05 % external solution, Apply to aa of scalp qd/bid prn for itch bumps (Patient not taking: Reported on 09/15/2022), Disp: 60 mL, Rfl: 3   fluticasone (FLONASE) 50 MCG/ACT nasal spray, Place 2 sprays into both nostrils daily. (Patient not taking: Reported on 09/15/2022), Disp: , Rfl:    furosemide (LASIX) 20 MG tablet, PLEASE SEE ATTACHED FOR DETAILED DIRECTIONS (Patient not taking: Reported on 09/15/2022), Disp: , Rfl:    furosemide (LASIX) 40 MG tablet, Take by mouth., Disp: , Rfl:    hydrochlorothiazide (HYDRODIURIL) 12.5 MG tablet, Take 12.5 mg by mouth daily. (Patient not taking: Reported on 09/15/2022), Disp: , Rfl:    HYDROcodone-acetaminophen (NORCO/VICODIN) 5-325 MG tablet, 1 PO Q 6hrs prn pain (Patient not taking: Reported on 09/15/2022), Disp: , Rfl:    influenza vaccine adjuvanted (FLUAD) 0.5 ML injection, Fluad 2016-17  64yr up(PF)45 mcg(15 mcgx3)/0.5 mL intramuscular syringe  ADM 0.5ML IM UTD (Patient not taking: Reported on 09/15/2022), Disp: , Rfl:    influenza vaccine adjuvanted (FLUAD) 0.5 ML injection, ADM 0.5ML IM UTD (Patient not taking: Reported on 09/15/2022), Disp: , Rfl:    insulin lispro (HUMALOG) 100 UNIT/ML injection, Inject 30 Units into the skin once. Pt uses insulin pump-uses 100 units every 3 days, Disp: , Rfl:    isosorbide mononitrate (IMDUR) 30 MG 24 hr tablet, Take 30 mg by mouth  daily., Disp: , Rfl:    KLOR-CON M10 10 MEQ tablet, PLEASE SEE ATTACHED FOR DETAILED DIRECTIONS (Patient not taking: Reported on 09/15/2022), Disp: , Rfl:    KLOR-CON M20 20 MEQ tablet, Take by mouth. (Patient not taking: Reported on 09/15/2022), Disp: , Rfl:    metoCLOPramide (REGLAN) 10 MG tablet, Take 10 mg by mouth 3 (three) times daily., Disp: , Rfl:    metolazone (ZAROXOLYN) 2.5 MG tablet, Take by mouth. (Patient not taking: Reported on 09/15/2022), Disp: , Rfl:    metoprolol succinate (TOPROL-XL) 25 MG 24 hr tablet, Take 12.5 mg by mouth at bedtime., Disp: , Rfl:    metoprolol tartrate (LOPRESSOR) 25 MG tablet, Take 25 mg by mouth 2 (two) times daily., Disp: , Rfl:    mometasone (NASONEX) 50 MCG/ACT nasal spray, Place 2 sprays into the nose daily. (Patient not taking: Reported on 09/15/2022), Disp: , Rfl:    nitroGLYCERIN (NITROSTAT) 0.4 MG SL tablet, PLEASE SEE ATTACHED FOR DETAILED DIRECTIONS, Disp: , Rfl:    omeprazole (PRILOSEC) 40 MG capsule, Take 40 mg by mouth daily., Disp: , Rfl:    ondansetron (ZOFRAN) 4 MG tablet, Take 2 tablets twice a day by oral route. (Patient not taking: Reported on 09/15/2022), Disp: , Rfl:    ondansetron (ZOFRAN-ODT) 4 MG disintegrating tablet, Take 4 mg by mouth every 8 (eight) hours as needed. (Patient not taking: Reported on 09/15/2022), Disp: , Rfl:    oxyCODONE (OXY IR/ROXICODONE) 5 MG immediate release tablet, Take 5 mg by mouth every 6 (six) hours as needed. (Patient not taking: Reported on 09/15/2022), Disp: , Rfl:    polyethylene glycol (MIRALAX / GLYCOLAX) 17 g packet, Take by mouth., Disp: , Rfl:    Potassium Chloride ER 20 MEQ TBCR, Take by mouth., Disp: , Rfl:    rosuvastatin (CRESTOR) 40 MG tablet, Take by mouth., Disp: , Rfl:    senna-docusate (SENOKOT-S) 8.6-50 MG tablet, Take by mouth., Disp: , Rfl:    Sennosides (SENNA) 8.6 MG CAPS, senna (Patient not taking: Reported on 09/15/2022), Disp: , Rfl:    Sennosides (SENNA-TIME PO), senna (Patient not  taking: Reported on 09/15/2022), Disp: , Rfl:    Sennosides (SENNA-TIME PO), senna (Patient not taking: Reported on 09/15/2022), Disp: , Rfl:    Sennosides (SENNA-TIME PO), , Disp: , Rfl:    simvastatin (ZOCOR) 40 MG tablet, Take 40 mg by mouth daily. (Patient not taking: Reported on 09/15/2022), Disp: , Rfl:    triamcinolone ointment (KENALOG) 0.1 %, Apply topically 2 (two) times daily. (Patient not taking: Reported on 09/15/2022), Disp: , Rfl:   Past Medical History: Past Medical History:  Diagnosis Date   Actinic keratosis 09/28/2020   upper sternum, bx   Barrett's esophagus    Basal cell carcinoma 11/13/2007   Left nasal root. Excised 12/07/2007, margins free.   Cancer (HCC)    skin   Diabetes mellitus type 1 (HCC)    Medtronic Medimed Insulin pump   Dysplastic nevus 04/07/2009  RLQA. Mild to moderate atypia, edges free.   Essential (primary) hypertension    Female dyspareunia    GERD (gastroesophageal reflux disease)    Barret's esphagus   Hematuria    Hyperlipidemia    Hypertension    Impaired hearing    left ear hearing loss, tinnitis   Menopause    Microscopic colitis    Nasal polyps    Osteoarthritis    right hip, back   Seasonal allergies    Sinusitis    Vertigo    none recently   Wears hearing aid in both ears     Tobacco Use: Social History   Tobacco Use  Smoking Status Never  Smokeless Tobacco Never    Labs: Review Flowsheet       Latest Ref Rng & Units 03/26/2015 08/03/2015 08/12/2022  Labs for ITP Cardiac and Pulmonary Rehab  Hemoglobin A1c 4.8 - 5.6 % 6.8  7.2  -  PH, Arterial 7.35 - 7.45 - - 7.392   PCO2 arterial 32 - 48 mmHg - - 38.1   Bicarbonate 20.0 - 28.0 mmol/L - - 23.2  23.5   TCO2 22 - 32 mmol/L - - 24  25   Acid-base deficit 0.0 - 2.0 mmol/L - - 2.0  3.0   O2 Saturation % - - 92  63     Details       Multiple values from one day are sorted in reverse-chronological order          Exercise Target Goals: Exercise Program  Goal: Individual exercise prescription set using results from initial 6 min walk test and THRR while considering  patient's activity barriers and safety.   Exercise Prescription Goal: Initial exercise prescription builds to 30-45 minutes a day of aerobic activity, 2-3 days per week.  Home exercise guidelines will be given to patient during program as part of exercise prescription that the participant will acknowledge.   Education: Aerobic Exercise: - Group verbal and visual presentation on the components of exercise prescription. Introduces F.I.T.T principle from ACSM for exercise prescriptions.  Reviews F.I.T.T. principles of aerobic exercise including progression. Written material given at graduation.   Education: Resistance Exercise: - Group verbal and visual presentation on the components of exercise prescription. Introduces F.I.T.T principle from ACSM for exercise prescriptions  Reviews F.I.T.T. principles of resistance exercise including progression. Written material given at graduation.    Education: Exercise & Equipment Safety: - Individual verbal instruction and demonstration of equipment use and safety with use of the equipment. Flowsheet Row Cardiac Rehab from 09/26/2022 in [facility] Cardiac and Pulmonary Rehab  Date 09/26/22  Educator [facility]  Instruction Review Code 1- Verbalizes Understanding       Education: Exercise Physiology & General Exercise Guidelines: - Group verbal and written instruction with models to review the exercise physiology of the cardiovascular system and associated critical values. Provides general exercise guidelines with specific guidelines to those with heart or lung disease.    Education: Flexibility, Balance, Mind/Body Relaxation: - Group verbal and visual presentation with interactive activity on the components of exercise prescription. Introduces F.I.T.T principle from ACSM for exercise prescriptions. Reviews F.I.T.T. principles of flexibility and balance  exercise training including progression. Also discusses the mind body connection.  Reviews various relaxation techniques to help reduce and manage stress (i.e. Deep breathing, progressive muscle relaxation, and visualization). Balance handout provided to take home. Written material given at graduation.   Activity Barriers & Risk Stratification:  Activity Barriers & Cardiac Risk Stratification - 09/26/22 1529  Activity Barriers & Cardiac Risk Stratification   Activity Barriers Other (comment)    Comments still under lifting restrictions post op and history of right hip fracture and repair (currently no pain just limited ROM)    Cardiac Risk Stratification High             6 Minute Walk:  6 Minute Walk     Row Name 09/26/22 1528         6 Minute Walk   Phase Initial     Distance 1300 feet     Walk Time 6 minutes     # of Rest Breaks 0     MPH 2.46     METS 2.81     RPE 12     Perceived Dyspnea  1     VO2 Peak 9.85     Symptoms No     Resting HR 72 bpm     Resting BP 110/62     Resting Oxygen Saturation  99 %     Exercise Oxygen Saturation  during 6 min walk 99 %     Max Ex. HR 97 bpm     Max Ex. BP 138/70     2 Minute Post BP 118/70              Oxygen Initial Assessment:   Oxygen Re-Evaluation:   Oxygen Discharge (Final Oxygen Re-Evaluation):   Initial Exercise Prescription:  Initial Exercise Prescription - 09/26/22 1500       Date of Initial Exercise RX and Referring Provider   Date 09/26/22    Referring Provider Dr. Dorothyann Peng      Oxygen   Maintain Oxygen Saturation 88% or higher      Treadmill   MPH 2    Grade 1    Minutes 15    METs 2.86      NuStep   Level 2   T6   SPM 80    Minutes 15    METs 2.81      REL-XR   Level 2    Speed 50    Minutes 15    METs 2.81      Biostep-RELP   Level 2    SPM 50    Minutes 15    METs 2.81      Track   Laps 30    Minutes 15    METs 2.63      Prescription Details    Frequency (times per week) 3    Duration Progress to 30 minutes of continuous aerobic without signs/symptoms of physical distress      Intensity   THRR 40-80% of Max Heartrate 99-127    Ratings of Perceived Exertion 11-13    Perceived Dyspnea 0-4      Progression   Progression Continue to progress workloads to maintain intensity without signs/symptoms of physical distress.      Resistance Training   Training Prescription Yes    Weight 1    Reps 10-15             Perform Capillary Blood Glucose checks as needed.  Exercise Prescription Changes:   Exercise Prescription Changes     Row Name 09/26/22 1500             Response to Exercise   Blood Pressure (Admit) 110/62       Blood Pressure (Exercise) 138/70       Blood Pressure (Exit) 118/70       Heart  Rate (Admit) 72 bpm       Heart Rate (Exercise) 97 bpm       Heart Rate (Exit) 77 bpm       Oxygen Saturation (Admit) 99 %       Oxygen Saturation (Exercise) 99 %       Oxygen Saturation (Exit) 98 %       Rating of Perceived Exertion (Exercise) 12       Perceived Dyspnea (Exercise) 1       Symptoms none       Comments 6 MWT results                Exercise Comments:   Exercise Comments     Row Name 09/29/22 1704           Exercise Comments First full day of exercise!  Patient was oriented to gym and equipment including functions, settings, policies, and procedures.  Patient's individual exercise prescription and treatment plan were reviewed.  All starting workloads were established based on the results of the 6 minute walk test done at initial orientation visit.  The plan for exercise progression was also introduced and progression will be customized based on patient's performance and goals.                Exercise Goals and Review:   Exercise Goals     Row Name 09/26/22 1546             Exercise Goals   Increase Physical Activity Yes       Intervention Provide advice, education, support  and counseling about physical activity/exercise needs.;Develop an individualized exercise prescription for aerobic and resistive training based on initial evaluation findings, risk stratification, comorbidities and participant's personal goals.       Expected Outcomes Short Term: Attend rehab on a regular basis to increase amount of physical activity.;Long Term: Add in home exercise to make exercise part of routine and to increase amount of physical activity.;Long Term: Exercising regularly at least 3-5 days a week.       Increase Strength and Stamina Yes       Intervention Provide advice, education, support and counseling about physical activity/exercise needs.;Develop an individualized exercise prescription for aerobic and resistive training based on initial evaluation findings, risk stratification, comorbidities and participant's personal goals.       Expected Outcomes Short Term: Increase workloads from initial exercise prescription for resistance, speed, and METs.;Short Term: Perform resistance training exercises routinely during rehab and add in resistance training at home;Long Term: Improve cardiorespiratory fitness, muscular endurance and strength as measured by increased METs and functional capacity ( )       Able to understand and use rate of perceived exertion (RPE) scale Yes       Intervention Provide education and explanation on how to use RPE scale       Expected Outcomes Short Term: Able to use RPE daily in rehab to express subjective intensity level;Long Term:  Able to use RPE to guide intensity level when exercising independently       Able to understand and use Dyspnea scale Yes       Intervention Provide education and explanation on how to use Dyspnea scale       Expected Outcomes Short Term: Able to use Dyspnea scale daily in rehab to express subjective sense of shortness of breath during exertion;Long Term: Able to use Dyspnea scale to guide intensity level when exercising  independently  Knowledge and understanding of Target Heart Rate Range (THRR) Yes       Intervention Provide education and explanation of THRR including how the numbers were predicted and where they are located for reference       Expected Outcomes Short Term: Able to state/look up THRR;Long Term: Able to use THRR to govern intensity when exercising independently;Short Term: Able to use daily as guideline for intensity in rehab       Able to check pulse independently Yes       Intervention Provide education and demonstration on how to check pulse in carotid and radial arteries.;Review the importance of being able to check your own pulse for safety during independent exercise       Expected Outcomes Short Term: Able to explain why pulse checking is important during independent exercise;Long Term: Able to check pulse independently and accurately       Understanding of Exercise Prescription Yes       Intervention Provide education, explanation, and written materials on patient's individual exercise prescription       Expected Outcomes Short Term: Able to explain program exercise prescription;Long Term: Able to explain home exercise prescription to exercise independently                Exercise Goals Re-Evaluation :  Exercise Goals Re-Evaluation     Row Name 09/29/22 1704             Exercise Goal Re-Evaluation   Exercise Goals Review Increase Physical Activity;Able to understand and use rate of perceived exertion (RPE) scale;Knowledge and understanding of Target Heart Rate Range (THRR);Understanding of Exercise Prescription;Increase Strength and Stamina;Able to check pulse independently       Comments Reviewed RPE and dyspnea scale, THR and program prescription with pt today.  Pt voiced understanding and was given a copy of goals to take home.       Expected Outcomes Short: Use RPE daily to regulate intensity.  Long: Follow program prescription in THR.                Discharge  Exercise Prescription (Final Exercise Prescription Changes):  Exercise Prescription Changes - 09/26/22 1500       Response to Exercise   Blood Pressure (Admit) 110/62    Blood Pressure (Exercise) 138/70    Blood Pressure (Exit) 118/70    Heart Rate (Admit) 72 bpm    Heart Rate (Exercise) 97 bpm    Heart Rate (Exit) 77 bpm    Oxygen Saturation (Admit) 99 %    Oxygen Saturation (Exercise) 99 %    Oxygen Saturation (Exit) 98 %    Rating of Perceived Exertion (Exercise) 12    Perceived Dyspnea (Exercise) 1    Symptoms none    Comments 6 MWT results             Nutrition:  Target Goals: Understanding of nutrition guidelines, daily intake of sodium 1500mg , cholesterol 200mg , calories 30% from fat and 7% or less from saturated fats, daily to have 5 or more servings of fruits and vegetables.  Education: All About Nutrition: -Group instruction provided by verbal, written material, interactive activities, discussions, models, and posters to present general guidelines for heart healthy nutrition including fat, fiber, MyPlate, the role of sodium in heart healthy nutrition, utilization of the nutrition label, and utilization of this knowledge for meal planning. Follow up email sent as well. Written material given at graduation. Flowsheet Row Cardiac Rehab from 09/26/2022 in [facility] Cardiac and Pulmonary Rehab  Education need identified 09/26/22       Biometrics:  Pre Biometrics - 09/26/22 1547       Pre Biometrics   Height 5\' 6"  (1.676 m)    Weight 125 lb 14.4 oz (57.1 kg)    Waist Circumference 31 inches    Hip Circumference 39.5 inches    Waist to Hip Ratio 0.78 %    BMI (Calculated) 20.33    Single Leg Stand 5.62 seconds              Nutrition Therapy Plan and Nutrition Goals:  Nutrition Therapy & Goals - 09/26/22 1640       Nutrition Therapy   Diet Carb controlled, cardiac    Protein (specify units) 75    Fiber 25 grams    Whole Grain Foods 3 servings     Saturated Fats 15 max. grams    Fruits and Vegetables 5 servings/day    Sodium 2 grams      Personal Nutrition Goals   Nutrition Goal Eat 3-5 small meal/snacks per day    Personal Goal #2 Measure carbs per meal, dose insulin and pair carbs with protein or healthy fats    Personal Goal #3 Try greek yogurt or protein shake    Comments Patient reports her appetite has gone down a lot over the past ~3-71months. She says that she often only eats half her plate, says she doses her insulin pump before eating. Recently she has dosed her insulin for the entire meal then only been able to eat half. Spoke to her about set out portions she can eat, even if small. Educated on the smaller portions of nutrient dense foods and how she could meet goals even with her lesser appetite. Tuna, cheese, peanut butter, beans, lentils, protein shake, chicken, avocado, ect as some examples of foods with lots of nutrition in smaller quantities. Reviewed heart healthy guidelines, but main goals are on feeding her body with her lesser appetite, choosing calorie dense foods, aiming for smaller more frequent meals and making sure blood sugars stay stable, not dropping due to too much insulin and not enough carbs. Educated on simple sugars and complex carbs, encouraged pairing carbs with protein or healthy fat. Built out several meals and snacks with foods she likes and will eat, focusing small portions, calorie dense foods, adequate protein and blood sugar control.      Intervention Plan   Intervention Prescribe, educate and counsel regarding individualized specific dietary modifications aiming towards targeted core components such as weight, hypertension, lipid management, diabetes, heart failure and other comorbidities.;Nutrition handout(s) given to patient.    Expected Outcomes Short Term Goal: Understand basic principles of dietary content, such as calories, fat, sodium, cholesterol and nutrients.;Short Term Goal: A plan has been  developed with personal nutrition goals set during dietitian appointment.;Long Term Goal: Adherence to prescribed nutrition plan.             Nutrition Assessments:  MEDIFICTS Score Key: >=70 Need to make dietary changes  40-70 Heart Healthy Diet <= 40 Therapeutic Level Cholesterol Diet  Flowsheet Row Cardiac Rehab from 09/26/2022 in [facility] Cardiac and Pulmonary Rehab  Picture Your Plate Total Score on Admission 60      Picture Your Plate Scores: <96 Unhealthy dietary pattern with much room for improvement. 41-50 Dietary pattern unlikely to meet recommendations for good health and room for improvement. 51-60 More healthful dietary pattern, with some room for improvement.  >60 Healthy dietary pattern, although there may be some  specific behaviors that could be improved.    Nutrition Goals Re-Evaluation:   Nutrition Goals Discharge (Final Nutrition Goals Re-Evaluation):   Psychosocial: Target Goals: Acknowledge presence or absence of significant depression and/or stress, maximize coping skills, provide positive support system. Participant is able to verbalize types and ability to use techniques and skills needed for reducing stress and depression.   Education: Stress, Anxiety, and Depression - Group verbal and visual presentation to define topics covered.  Reviews how body is impacted by stress, anxiety, and depression.  Also discusses healthy ways to reduce stress and to treat/manage anxiety and depression.  Written material given at graduation.   Education: Sleep Hygiene -Provides group verbal and written instruction about how sleep can affect your health.  Define sleep hygiene, discuss sleep cycles and impact of sleep habits. Review good sleep hygiene tips.    Initial Review & Psychosocial Screening:  Initial Psych Review & Screening - 09/15/22 1024       Initial Review   Current issues with None Identified      Family Dynamics   Good Support System? Yes    Comments  She can look to her husband for support. Brandyn is feeling better now that she is getting further away from surgery. She is ready to start rehab and get more strength.      Barriers   Psychosocial barriers to participate in program The patient should benefit from training in stress management and relaxation.;There are no identifiable barriers or psychosocial needs.      Screening Interventions   Interventions Encouraged to exercise;To provide support and resources with identified psychosocial needs;Provide feedback about the scores to participant    Expected Outcomes Short Term goal: Utilizing psychosocial counselor, staff and physician to assist with identification of specific Stressors or current issues interfering with healing process. Setting desired goal for each stressor or current issue identified.;Long Term Goal: Stressors or current issues are controlled or eliminated.;Short Term goal: Identification and review with participant of any Quality of Life or Depression concerns found by scoring the questionnaire.;Long Term goal: The participant improves quality of Life and PHQ9 Scores as seen by post scores and/or verbalization of changes             Quality of Life Scores:   Quality of Life - 09/26/22 1548       Quality of Life   Select Quality of Life      Quality of Life Scores   Health/Function Pre 19.6 %    Socioeconomic Pre 22.13 %    Psych/Spiritual Pre 29.14 %    Family Pre 23.4 %    GLOBAL Pre 22.63 %            Scores of 19 and below usually indicate a poorer quality of life in these areas.  A difference of  2-3 points is a clinically meaningful difference.  A difference of 2-3 points in the total score of the Quality of Life Index has been associated with significant improvement in overall quality of life, self-image, physical symptoms, and general health in studies assessing change in quality of life.  PHQ-9: Review Flowsheet       09/26/2022  Depression screen  PHQ 2/9  Decreased Interest 1  Down, Depressed, Hopeless 1  PHQ - 2 Score 2  Altered sleeping 1  Tired, decreased energy 2  Change in appetite 1  Feeling bad or failure about yourself  0  Trouble concentrating 0  Moving slowly or fidgety/restless 0  Suicidal  thoughts 0  PHQ-9 Score 6  Difficult doing work/chores Somewhat difficult    Details           Interpretation of Total Score  Total Score Depression Severity:  1-4 = Minimal depression, 5-9 = Mild depression, 10-14 = Moderate depression, 15-19 = Moderately severe depression, 20-27 = Severe depression   Psychosocial Evaluation and Intervention:  Psychosocial Evaluation - 09/15/22 1025       Psychosocial Evaluation & Interventions   Interventions Encouraged to exercise with the program and follow exercise prescription;Stress management education;Relaxation education    Comments She can look to her husband for support. Shabre is feeling better now that she is getting further away from surgery. She is ready to start rehab and get more strength.    Expected Outcomes Short: Start [facility] to help with mood. Long: Maintain a healthy mental state    Continue Psychosocial Services  Follow up required by staff             Psychosocial Re-Evaluation:   Psychosocial Discharge (Final Psychosocial Re-Evaluation):   Vocational Rehabilitation: Provide vocational rehab assistance to qualifying candidates.   Vocational Rehab Evaluation & Intervention:   Education: Education Goals: Education classes will be provided on a variety of topics geared toward better understanding of heart health and risk factor modification. Participant will state understanding/return demonstration of topics presented as noted by education test scores.  Learning Barriers/Preferences:  Learning Barriers/Preferences - 09/15/22 1021       Learning Barriers/Preferences   Learning Barriers None    Learning Preferences None              General Cardiac Education Topics:  AED/CPR: - Group verbal and written instruction with the use of models to demonstrate the basic use of the AED with the basic ABC's of resuscitation.   Anatomy and Cardiac Procedures: - Group verbal and visual presentation and models provide information about basic cardiac anatomy and function. Reviews the testing methods done to diagnose heart disease and the outcomes of the test results. Describes the treatment choices: Medical Management, Angioplasty, or Coronary Bypass Surgery for treating various heart conditions including Myocardial Infarction, Angina, Valve Disease, and Cardiac Arrhythmias.  Written material given at graduation.   Medication Safety: - Group verbal and visual instruction to review commonly prescribed medications for heart and lung disease. Reviews the medication, class of the drug, and side effects. Includes the steps to properly store meds and maintain the prescription regimen.  Written material given at graduation.   Intimacy: - Group verbal instruction through game format to discuss how heart and lung disease can affect sexual intimacy. Written material given at graduation..   Know Your Numbers and Heart Failure: - Group verbal and visual instruction to discuss disease risk factors for cardiac and pulmonary disease and treatment options.  Reviews associated critical values for Overweight/Obesity, Hypertension, Cholesterol, and Diabetes.  Discusses basics of heart failure: signs/symptoms and treatments.  Introduces Heart Failure Zone chart for action plan for heart failure.  Written material given at graduation.   Infection Prevention: - Provides verbal and written material to individual with discussion of infection control including proper hand washing and proper equipment cleaning during exercise session. Flowsheet Row Cardiac Rehab from 09/26/2022 in [facility] Cardiac and Pulmonary Rehab  Date 09/26/22  Educator [facility]  Instruction  Review Code 1- Verbalizes Understanding       Falls Prevention: - Provides verbal and written material to individual with discussion of falls prevention and safety. Flowsheet Row Cardiac Rehab  from 09/26/2022 in [facility] Cardiac and Pulmonary Rehab  Date 09/26/22  Educator [facility]  Instruction Review Code 1- Verbalizes Understanding       Other: -Provides group and verbal instruction on various topics (see comments)   Knowledge Questionnaire Score:  Knowledge Questionnaire Score - 09/26/22 1549       Knowledge Questionnaire Score   Pre Score 25/26             Core Components/Risk Factors/Patient Goals at Admission:  Personal Goals and Risk Factors at Admission - 09/26/22 1549       Core Components/Risk Factors/Patient Goals on Admission    Weight Management Weight Maintenance    Intervention Weight Management: Develop a combined nutrition and exercise program designed to reach desired caloric intake, while maintaining appropriate intake of nutrient and fiber, sodium and fats, and appropriate energy expenditure required for the weight goal.;Weight Management: Provide education and appropriate resources to help participant work on and attain dietary goals.;Weight Management/Obesity: Establish reasonable short term and long term weight goals.    Expected Outcomes Short Term: Continue to assess and modify interventions until short term weight is achieved;Long Term: Adherence to nutrition and physical activity/exercise program aimed toward attainment of established weight goal;Understanding recommendations for meals to include 15-35% energy as protein, 25-35% energy from fat, 35-60% energy from carbohydrates, less than 200mg  of dietary cholesterol, 20-35 gm of total fiber daily;Understanding of distribution of calorie intake throughout the day with the consumption of 4-5 meals/snacks;Weight Maintenance: Understanding of the daily nutrition guidelines, which includes 25-35% calories from fat,  7% or less cal from saturated fats, less than 200mg  cholesterol, less than 1.5gm of sodium, & 5 or more servings of fruits and vegetables daily    Diabetes Yes    Intervention Provide education about signs/symptoms and action to take for hypo/hyperglycemia.;Provide education about proper nutrition, including hydration, and aerobic/resistive exercise prescription along with prescribed medications to achieve blood glucose in normal ranges: Fasting glucose 65-99 mg/dL    Expected Outcomes Short Term: Participant verbalizes understanding of the signs/symptoms and immediate care of hyper/hypoglycemia, proper foot care and importance of medication, aerobic/resistive exercise and nutrition plan for blood glucose control.;Long Term: Attainment of HbA1C < 7%.    Hypertension Yes    Intervention Provide education on lifestyle modifcations including regular physical activity/exercise, weight management, moderate sodium restriction and increased consumption of fresh fruit, vegetables, and low fat dairy, alcohol moderation, and smoking cessation.;Monitor prescription use compliance.    Expected Outcomes Short Term: Continued assessment and intervention until BP is < 140/47mm HG in hypertensive participants. < 130/82mm HG in hypertensive participants with diabetes, heart failure or chronic kidney disease.;Long Term: Maintenance of blood pressure at goal levels.    Lipids Yes    Intervention Provide education and support for participant on nutrition & aerobic/resistive exercise along with prescribed medications to achieve LDL 70mg , HDL >40mg .    Expected Outcomes Short Term: Participant states understanding of desired cholesterol values and is compliant with medications prescribed. Participant is following exercise prescription and nutrition guidelines.;Long Term: Cholesterol controlled with medications as prescribed, with individualized exercise RX and with personalized nutrition plan. Value goals: LDL < 70mg , HDL > 40  mg.             Education:Diabetes - Individual verbal and written instruction to review signs/symptoms of diabetes, desired ranges of glucose level fasting, after meals and with exercise. Acknowledge that pre and post exercise glucose checks will be done for 3 sessions at entry of program. [employer]  Cardiac Rehab from 09/26/2022 in [facility] Cardiac and Pulmonary Rehab  Date 09/26/22  Educator [facility]  Instruction Review Code 1- Verbalizes Understanding       Core Components/Risk Factors/Patient Goals Review:    Core Components/Risk Factors/Patient Goals at Discharge (Final Review):    ITP Comments:  ITP Comments     Row Name 09/15/22 1019 09/26/22 1527 09/29/22 1704 10/05/22 1216     ITP Comments Virtual Visit completed. Patient informed on EP and RD appointment and 6 Minute walk test. Patient also informed of patient health questionnaires on My Chart. Patient Verbalizes understanding. Visit diagnosis can be found in [facility] 08/23/2022. Completed and gym orientation. Initial ITP created and sent for review to Dr. Bethann Punches, Medical Director. First full day of exercise!  Patient was oriented to gym and equipment including functions, settings, policies, and procedures.  Patient's individual exercise prescription and treatment plan were reviewed.  All starting workloads were established based on the results of the 6 minute walk test done at initial orientation visit.  The plan for exercise progression was also introduced and progression will be customized based on patient's performance and goals. 30 Day review completed. Medical Director ITP review done, changes made as directed, and signed approval by Medical Director.    new to program             Comments:

## 2022-10-06 ENCOUNTER — Encounter: Payer: Medicare Other | Admitting: *Deleted

## 2022-10-06 DIAGNOSIS — Z951 Presence of aortocoronary bypass graft: Secondary | ICD-10-CM

## 2022-10-06 DIAGNOSIS — Z48812 Encounter for surgical aftercare following surgery on the circulatory system: Secondary | ICD-10-CM | POA: Diagnosis not present

## 2022-10-06 NOTE — Progress Notes (Signed)
Daily Session Note  Patient Details  Name: Cassandra Joyce [place] MRN: [id number] Date of Birth: Dec 31, 1945 Referring Provider:   Flowsheet Row Cardiac Rehab from 09/26/2022 in [facility] Cardiac and Pulmonary Rehab  Referring Provider Dr. Dorothyann Peng       Encounter Date: 10/06/2022  Check In:  Session Check In - 10/06/22 1606       Check-In   Supervising physician immediately available to respond to emergencies See telemetry face sheet for immediately available ER MD    Location [facility]-Cardiac & Pulmonary Rehab    Staff Present Maxon Suzzette Righter, , Exercise Physiologist;Marrah Vanevery Jewel Baize, RN BSN;Joseph Reino Kent, RCP,RRT,BSRT    Virtual Visit No    Medication changes reported     No    Fall or balance concerns reported    No    Warm-up and Cool-down Performed on first and last piece of equipment    Resistance Training Performed Yes    VAD Patient? No    PAD/SET Patient? No      Pain Assessment   Currently in Pain? No/denies                Social History   Tobacco Use  Smoking Status Never  Smokeless Tobacco Never    Goals Met:  Independence with exercise equipment Exercise tolerated well No report of concerns or symptoms today Strength training completed today  Goals Unmet:  Not Applicable  Comments: Pt able to follow exercise prescription today without complaint.  Will continue to monitor for progression.    Dr. Bethann Punches is Medical Director for [facility] Cardiac Rehabilitation.  Dr. Vida Rigger is Medical Director for [facility] Pulmonary Rehabilitation.

## 2022-10-10 ENCOUNTER — Encounter: Payer: Medicare Other | Admitting: *Deleted

## 2022-10-10 DIAGNOSIS — Z951 Presence of aortocoronary bypass graft: Secondary | ICD-10-CM

## 2022-10-10 DIAGNOSIS — Z48812 Encounter for surgical aftercare following surgery on the circulatory system: Secondary | ICD-10-CM | POA: Diagnosis not present

## 2022-10-10 NOTE — Progress Notes (Signed)
Daily Session Note  Patient Details  Name: Cassandra Joyce MRN: [id number] Date of Birth: February 02, 1945 Referring Provider:   Flowsheet Row Cardiac Rehab from 09/26/2022 in [facility] Cardiac and Pulmonary Rehab  Referring Provider Dr. Dorothyann Peng       Encounter Date: 10/10/2022  Check In:  Session Check In - 10/10/22 1532       Check-In   Supervising physician immediately available to respond to emergencies See telemetry face sheet for immediately available ER MD    Location [facility]-Cardiac & Pulmonary Rehab    Staff Present Maxon Suzzette Righter, , Exercise Physiologist;Airik Goodlin Jewel Baize, RN BSN;Joseph Shelbie Proctor, RN, [place]    Virtual Visit No    Medication changes reported     No    Fall or balance concerns reported    No    Warm-up and Cool-down Performed on first and last piece of equipment    Resistance Training Performed Yes    VAD Patient? No    PAD/SET Patient? No      Pain Assessment   Currently in Pain? No/denies                Social History   Tobacco Use  Smoking Status Never  Smokeless Tobacco Never    Goals Met:  Independence with exercise equipment Exercise tolerated well No report of concerns or symptoms today Strength training completed today  Goals Unmet:  Not Applicable  Comments: Pt able to follow exercise prescription today without complaint.  Will continue to monitor for progression.    Dr. Bethann Punches is Medical Director for [facility] Cardiac Rehabilitation.  Dr. Vida Rigger is Medical Director for [facility] Pulmonary Rehabilitation.

## 2022-10-12 ENCOUNTER — Encounter: Payer: Medicare Other | Admitting: *Deleted

## 2022-10-12 DIAGNOSIS — Z48812 Encounter for surgical aftercare following surgery on the circulatory system: Secondary | ICD-10-CM | POA: Diagnosis not present

## 2022-10-12 DIAGNOSIS — Z951 Presence of aortocoronary bypass graft: Secondary | ICD-10-CM

## 2022-10-12 NOTE — Progress Notes (Signed)
Daily Session Note  Patient Details  Name: Cassandra Joyce MRN: [id number] Date of Birth: 10/25/1945 Referring Provider:   Flowsheet Row Cardiac Rehab from 09/26/2022 in [facility] Cardiac and Pulmonary Rehab  Referring Provider Dr. Dorothyann Peng       Encounter Date: 10/12/2022  Check In:  Session Check In - 10/12/22 1537       Check-In   Supervising physician immediately available to respond to emergencies See telemetry face sheet for immediately available ER MD    Location [facility]-Cardiac & Pulmonary Rehab    Staff Present Maxon Suzzette Righter, , Exercise Physiologist;Merrell [place] Jewel Baize, RN BSN;Joseph Hood, RCP,RRT,BSRT;Kelly Darlington, BS, ACSM CEP, Exercise Physiologist    Virtual Visit No    Medication changes reported     No    Fall or balance concerns reported    No    Warm-up and Cool-down Performed on first and last piece of equipment    Resistance Training Performed Yes    VAD Patient? No    PAD/SET Patient? No      Pain Assessment   Currently in Pain? No/denies                Social History   Tobacco Use  Smoking Status Never  Smokeless Tobacco Never    Goals Met:  Independence with exercise equipment Exercise tolerated well No report of concerns or symptoms today Strength training completed today  Goals Unmet:  Not Applicable  Comments: Pt able to follow exercise prescription today without complaint.  Will continue to monitor for progression.    Dr. Bethann Punches is Medical Director for [facility] Cardiac Rehabilitation.  Dr. Vida Rigger is Medical Director for [facility] Pulmonary Rehabilitation.

## 2022-10-13 ENCOUNTER — Encounter: Payer: Medicare Other | Admitting: *Deleted

## 2022-10-13 DIAGNOSIS — Z48812 Encounter for surgical aftercare following surgery on the circulatory system: Secondary | ICD-10-CM | POA: Diagnosis not present

## 2022-10-13 DIAGNOSIS — Z951 Presence of aortocoronary bypass graft: Secondary | ICD-10-CM

## 2022-10-13 NOTE — Progress Notes (Signed)
Daily Session Note  Patient Details  Name: Cassandra Joyce MRN: [id number] Date of Birth: March 28, 1945 Referring Provider:   Flowsheet Row Cardiac Rehab from 09/26/2022 in [facility] Cardiac and Pulmonary Rehab  Referring Provider Dr. Dorothyann Peng       Encounter Date: 10/13/2022  Check In:  Session Check In - 10/13/22 1536       Check-In   Supervising physician immediately available to respond to emergencies See telemetry face sheet for immediately available ER MD    Location [facility]-Cardiac & Pulmonary Rehab    Staff Present Susann Givens, RN BSN;Joseph [place], RCP,RRT,BSRT;Noah Tickle, [place], Exercise Physiologist;Maxon [place] BS, , Exercise Physiologist    Virtual Visit No    Medication changes reported     No    Fall or balance concerns reported    No    Warm-up and Cool-down Performed on first and last piece of equipment    Resistance Training Performed Yes    VAD Patient? No    PAD/SET Patient? No      Pain Assessment   Currently in Pain? No/denies                Social History   Tobacco Use  Smoking Status Never  Smokeless Tobacco Never    Goals Met:  Independence with exercise equipment Exercise tolerated well No report of concerns or symptoms today Strength training completed today  Goals Unmet:  Not Applicable  Comments: Pt able to follow exercise prescription today without complaint.  Will continue to monitor for progression.    Dr. Bethann Punches is Medical Director for [facility] Cardiac Rehabilitation.  Dr. Vida Rigger is Medical Director for [facility] Pulmonary Rehabilitation.

## 2022-10-17 ENCOUNTER — Encounter: Payer: Medicare Other | Admitting: *Deleted

## 2022-10-17 DIAGNOSIS — Z951 Presence of aortocoronary bypass graft: Secondary | ICD-10-CM

## 2022-10-17 DIAGNOSIS — Z48812 Encounter for surgical aftercare following surgery on the circulatory system: Secondary | ICD-10-CM | POA: Diagnosis not present

## 2022-10-17 NOTE — Progress Notes (Signed)
Daily Session Note  Patient Details  Name: Cassandra Joyce MRN: [id number] Date of Birth: November 01, 1945 Referring Provider:   Flowsheet Row Cardiac Rehab from 09/26/2022 in [facility] Cardiac and Pulmonary Rehab  Referring Provider Dr. Dorothyann Peng       Encounter Date: 10/17/2022  Check In:  Session Check In - 10/17/22 1530       Check-In   Supervising physician immediately available to respond to emergencies See telemetry face sheet for immediately available ER MD    Location [facility]-Cardiac & Pulmonary Rehab    Staff Present Maxon Suzzette Righter, , Exercise Physiologist;Rya Rausch Jewel Baize, RN BSN;Joseph Shelbie Proctor, RN, ADN    Virtual Visit No    Medication changes reported     No    Fall or balance concerns reported    No    Warm-up and Cool-down Performed on first and last piece of equipment    Resistance Training Performed Yes    VAD Patient? No    PAD/SET Patient? No      Pain Assessment   Currently in Pain? No/denies                Social History   Tobacco Use  Smoking Status Never  Smokeless Tobacco Never    Goals Met:  Independence with exercise equipment Exercise tolerated well No report of concerns or symptoms today Strength training completed today  Goals Unmet:  Not Applicable  Comments: Pt able to follow exercise prescription today without complaint.  Will continue to monitor for progression.    Dr. Bethann Punches is Medical Director for [facility] Cardiac Rehabilitation.  Dr. Vida Rigger is Medical Director for [facility] Pulmonary Rehabilitation.

## 2022-10-19 ENCOUNTER — Encounter: Payer: Medicare Other | Admitting: *Deleted

## 2022-10-19 DIAGNOSIS — Z951 Presence of aortocoronary bypass graft: Secondary | ICD-10-CM

## 2022-10-19 DIAGNOSIS — Z48812 Encounter for surgical aftercare following surgery on the circulatory system: Secondary | ICD-10-CM | POA: Diagnosis not present

## 2022-10-19 NOTE — Progress Notes (Signed)
Daily Session Note  Patient Details  Name: Cassandra Joyce MRN: [id number] Date of Birth: 07-Nov-1945 Referring Provider:   Flowsheet Row Cardiac Rehab from 09/26/2022 in [facility] Cardiac and Pulmonary Rehab  Referring Provider Dr. Dorothyann Peng       Encounter Date: 10/19/2022  Check In:  Session Check In - 10/19/22 1544       Check-In   Supervising physician immediately available to respond to emergencies See telemetry face sheet for immediately available ER MD    Location [facility]-Cardiac & Pulmonary Rehab    Staff Present Lanny Hurst, RN, ADN;Female Minish Jewel Baize, RN BSN;Joseph Hood, RCP,RRT,BSRT;Cora Collum, RN, BSN, CCRP    Virtual Visit No    Medication changes reported     No    Fall or balance concerns reported    No    Warm-up and Cool-down Performed on first and last piece of equipment    Resistance Training Performed Yes    VAD Patient? No    PAD/SET Patient? No      Pain Assessment   Currently in Pain? No/denies                Social History   Tobacco Use  Smoking Status Never  Smokeless Tobacco Never    Goals Met:  Independence with exercise equipment Exercise tolerated well No report of concerns or symptoms today Strength training completed today  Goals Unmet:  Not Applicable  Comments: Pt able to follow exercise prescription today without complaint.  Will continue to monitor for progression.    Dr. Bethann Punches is Medical Director for [facility] Cardiac Rehabilitation.  Dr. Vida Rigger is Medical Director for [facility] Pulmonary Rehabilitation.

## 2022-10-20 ENCOUNTER — Encounter: Payer: Medicare Other | Admitting: *Deleted

## 2022-10-20 DIAGNOSIS — Z951 Presence of aortocoronary bypass graft: Secondary | ICD-10-CM

## 2022-10-20 DIAGNOSIS — Z48812 Encounter for surgical aftercare following surgery on the circulatory system: Secondary | ICD-10-CM | POA: Diagnosis not present

## 2022-10-20 NOTE — Progress Notes (Signed)
Daily Session Note  Patient Details  Name: Cassandra Joyce MRN: [id number] Date of Birth: 1945/07/19 Referring Provider:   Flowsheet Row Cardiac Rehab from 09/26/2022 in [facility] Cardiac and Pulmonary Rehab  Referring Provider Dr. Dorothyann Peng       Encounter Date: 10/20/2022  Check In:  Session Check In - 10/20/22 1526       Check-In   Supervising physician immediately available to respond to emergencies See telemetry face sheet for immediately available ER MD    Location [facility]-Cardiac & Pulmonary Rehab    Staff Present Susann Givens, RN BSN;Joseph Reino Kent, Guinevere Ferrari, RN, [place]    Virtual Visit No    Medication changes reported     No    Fall or balance concerns reported    No    Warm-up and Cool-down Performed on first and last piece of equipment    Resistance Training Performed Yes    VAD Patient? No    PAD/SET Patient? No      Pain Assessment   Currently in Pain? No/denies                Social History   Tobacco Use  Smoking Status Never  Smokeless Tobacco Never    Goals Met:  Independence with exercise equipment Exercise tolerated well No report of concerns or symptoms today Strength training completed today  Goals Unmet:  Not Applicable  Comments: Pt able to follow exercise prescription today without complaint.  Will continue to monitor for progression.    Dr. Bethann Punches is Medical Director for [facility] Cardiac Rehabilitation.  Dr. Vida Rigger is Medical Director for [facility] Pulmonary Rehabilitation.

## 2022-10-24 ENCOUNTER — Encounter: Payer: Medicare Other | Admitting: *Deleted

## 2022-10-24 DIAGNOSIS — Z951 Presence of aortocoronary bypass graft: Secondary | ICD-10-CM

## 2022-10-24 DIAGNOSIS — Z48812 Encounter for surgical aftercare following surgery on the circulatory system: Secondary | ICD-10-CM | POA: Diagnosis not present

## 2022-10-24 NOTE — Progress Notes (Signed)
Daily Session Note  Patient Details  Name: Cassandra Joyce MRN: [id number] Date of Birth: [birth year] Referring Provider:   Flowsheet Row Cardiac Rehab from 09/26/2022 in [facility] Cardiac and Pulmonary Rehab  Referring Provider Dr. Dorothyann Peng       Encounter Date: 10/24/2022  Check In:  Session Check In - 10/24/22 1528       Check-In   Supervising physician immediately available to respond to emergencies See telemetry face sheet for immediately available ER MD    Location [facility]-Cardiac & Pulmonary Rehab    Staff Present Maxon Conetta BS, , Exercise Physiologist;Joseph Rod Can, RN BSN    Virtual Visit No    Medication changes reported     No    Fall or balance concerns reported    No    Warm-up and Cool-down Performed on first and last piece of equipment    Resistance Training Performed Yes    VAD Patient? No    PAD/SET Patient? No      Pain Assessment   Currently in Pain? No/denies                Social History   Tobacco Use  Smoking Status Never  Smokeless Tobacco Never    Goals Met:  Independence with exercise equipment Exercise tolerated well No report of concerns or symptoms today Strength training completed today  Goals Unmet:  Not Applicable  Comments: Pt able to follow exercise prescription today without complaint.  Will continue to monitor for progression.    Dr. Bethann Punches is Medical Director for [facility] Cardiac Rehabilitation.  Dr. Vida Rigger is Medical Director for [facility] Pulmonary Rehabilitation.

## 2022-10-25 ENCOUNTER — Other Ambulatory Visit
Admission: RE | Admit: 2022-10-25 | Discharge: 2022-10-25 | Disposition: A | Discharge status: Home / Self Care | Payer: Medicare Other | Source: Ambulatory Visit | Attending: Infectious Diseases | Admitting: Infectious Diseases

## 2022-10-25 DIAGNOSIS — I25119 Atherosclerotic heart disease of native coronary artery with unspecified angina pectoris: Secondary | ICD-10-CM | POA: Insufficient documentation

## 2022-10-25 DIAGNOSIS — Z951 Presence of aortocoronary bypass graft: Secondary | ICD-10-CM | POA: Diagnosis not present

## 2022-10-25 DIAGNOSIS — E10649 Type 1 diabetes mellitus with hypoglycemia without coma: Secondary | ICD-10-CM | POA: Insufficient documentation

## 2022-10-25 DIAGNOSIS — E1059 Type 1 diabetes mellitus with other circulatory complications: Secondary | ICD-10-CM | POA: Insufficient documentation

## 2022-10-25 DIAGNOSIS — E1069 Type 1 diabetes mellitus with other specified complication: Secondary | ICD-10-CM | POA: Insufficient documentation

## 2022-10-25 DIAGNOSIS — I152 Hypertension secondary to endocrine disorders: Secondary | ICD-10-CM | POA: Diagnosis not present

## 2022-10-25 DIAGNOSIS — E785 Hyperlipidemia, unspecified: Secondary | ICD-10-CM | POA: Insufficient documentation

## 2022-10-25 DIAGNOSIS — E1159 Type 2 diabetes mellitus with other circulatory complications: Secondary | ICD-10-CM | POA: Insufficient documentation

## 2022-10-25 LAB — BRAIN NATRIURETIC PEPTIDE: B Natriuretic Peptide: 752.1 pg/mL — ABNORMAL HIGH (ref 0.0–100.0)

## 2022-10-26 ENCOUNTER — Encounter: Payer: Medicare Other | Admitting: *Deleted

## 2022-10-26 DIAGNOSIS — Z951 Presence of aortocoronary bypass graft: Secondary | ICD-10-CM

## 2022-10-26 DIAGNOSIS — Z48812 Encounter for surgical aftercare following surgery on the circulatory system: Secondary | ICD-10-CM | POA: Diagnosis not present

## 2022-10-26 NOTE — Progress Notes (Signed)
Daily Session Note  Patient Details  Name: Cassandra Joyce MRN: [id number] Date of Birth: [birth year] Referring Provider:   Flowsheet Row Cardiac Rehab from 09/26/2022 in [facility] Cardiac and Pulmonary Rehab  Referring Provider Dr. Dorothyann Peng       Encounter Date: 10/26/2022  Check In:  Session Check In - 10/26/22 1537       Check-In   Supervising physician immediately available to respond to emergencies See telemetry face sheet for immediately available ER MD    Location [facility]-Cardiac & Pulmonary Rehab    Staff Present Lanny Hurst, RN, ADN;Tranesha Lessner Jewel Baize, RN BSN;Joseph Hood, RCP,RRT,BSRT;Cora Collum, RN, BSN, CCRP    Virtual Visit No    Medication changes reported     No    Fall or balance concerns reported    No    Warm-up and Cool-down Performed on first and last piece of equipment    Resistance Training Performed Yes    VAD Patient? No    PAD/SET Patient? No      Pain Assessment   Currently in Pain? No/denies                Social History   Tobacco Use  Smoking Status Never  Smokeless Tobacco Never    Goals Met:  Independence with exercise equipment Exercise tolerated well No report of concerns or symptoms today Strength training completed today  Goals Unmet:  Not Applicable  Comments: Pt able to follow exercise prescription today without complaint.  Will continue to monitor for progression.    Dr. Bethann Punches is Medical Director for [facility] Cardiac Rehabilitation.  Dr. Vida Rigger is Medical Director for [facility] Pulmonary Rehabilitation.

## 2022-10-27 ENCOUNTER — Encounter: Payer: Medicare Other | Admitting: *Deleted

## 2022-10-27 DIAGNOSIS — Z48812 Encounter for surgical aftercare following surgery on the circulatory system: Secondary | ICD-10-CM | POA: Diagnosis not present

## 2022-10-27 DIAGNOSIS — Z951 Presence of aortocoronary bypass graft: Secondary | ICD-10-CM

## 2022-10-27 NOTE — Progress Notes (Signed)
Daily Session Note  Patient Details  Name: Cassandra Joyce MRN: [id number] Date of Birth: 1945/03/20 Referring Provider:   Flowsheet Row Cardiac Rehab from 09/26/2022 in [facility] Cardiac and Pulmonary Rehab  Referring Provider Dr. Dorothyann Peng       Encounter Date: 10/27/2022  Check In:  Session Check In - 10/27/22 1547       Check-In   Supervising physician immediately available to respond to emergencies See telemetry face sheet for immediately available ER MD    Location [facility]-Cardiac & Pulmonary Rehab    Staff Present Susann Givens, RN BSN;Megan Katrinka Blazing, RN, Bobetta Lime, RCP,RRT,BSRT    Virtual Visit No    Medication changes reported     No    Fall or balance concerns reported    No    Warm-up and Cool-down Performed on first and last piece of equipment    Resistance Training Performed Yes    VAD Patient? No    PAD/SET Patient? No      Pain Assessment   Currently in Pain? No/denies                Social History   Tobacco Use  Smoking Status Never  Smokeless Tobacco Never    Goals Met:  Independence with exercise equipment Exercise tolerated well No report of concerns or symptoms today Strength training completed today  Goals Unmet:  Not Applicable  Comments: Pt able to follow exercise prescription today without complaint.  Will continue to monitor for progression.    Dr. Bethann Punches is Medical Director for [facility] Cardiac Rehabilitation.  Dr. Vida Rigger is Medical Director for [facility] Pulmonary Rehabilitation.

## 2022-10-31 ENCOUNTER — Encounter: Payer: Medicare Other | Admitting: *Deleted

## 2022-10-31 DIAGNOSIS — Z951 Presence of aortocoronary bypass graft: Secondary | ICD-10-CM

## 2022-10-31 DIAGNOSIS — Z48812 Encounter for surgical aftercare following surgery on the circulatory system: Secondary | ICD-10-CM | POA: Diagnosis not present

## 2022-10-31 NOTE — Progress Notes (Signed)
Daily Session Note  Patient Details  Name: Cassandra Joyce MRN: [id number] Date of Birth: [birth year] Referring Provider:   Flowsheet Row Cardiac Rehab from 09/26/2022 in [facility] Cardiac and Pulmonary Rehab  Referring Provider Dr. Dorothyann Peng       Encounter Date: 10/31/2022  Check In:  Session Check In - 10/31/22 1539       Check-In   Supervising physician immediately available to respond to emergencies See telemetry face sheet for immediately available ER MD    Location [facility]-Cardiac & Pulmonary Rehab    Staff Present Maxon Suzzette Righter, , Exercise Physiologist;Kyair Ditommaso Jewel Baize, RN BSN;Joseph Hood, RCP,RRT,BSRT;Kelly Palisade, BS, ACSM CEP, Exercise Physiologist    Virtual Visit No    Medication changes reported     No    Fall or balance concerns reported    No    Warm-up and Cool-down Performed on first and last piece of equipment    Resistance Training Performed Yes    VAD Patient? No    PAD/SET Patient? No      Pain Assessment   Currently in Pain? No/denies                Social History   Tobacco Use  Smoking Status Never  Smokeless Tobacco Never    Goals Met:  Independence with exercise equipment Exercise tolerated well No report of concerns or symptoms today Strength training completed today  Goals Unmet:  Not Applicable  Comments: Pt able to follow exercise prescription today without complaint.  Will continue to monitor for progression.    Dr. Bethann Punches is Medical Director for [facility] Cardiac Rehabilitation.  Dr. Vida Rigger is Medical Director for [facility] Pulmonary Rehabilitation.

## 2022-11-02 ENCOUNTER — Encounter: Payer: Self-pay | Admitting: *Deleted

## 2022-11-02 ENCOUNTER — Encounter: Payer: Medicare Other | Admitting: *Deleted

## 2022-11-02 DIAGNOSIS — Z951 Presence of aortocoronary bypass graft: Secondary | ICD-10-CM

## 2022-11-02 DIAGNOSIS — Z48812 Encounter for surgical aftercare following surgery on the circulatory system: Secondary | ICD-10-CM | POA: Diagnosis not present

## 2022-11-02 NOTE — Progress Notes (Signed)
Cardiac Individual Treatment Plan  Patient Details  Name: Telesha W Swaziland MRN: [id number] Date of Birth: 04-23-1945 Referring Provider:   Flowsheet Row Cardiac Rehab from 09/26/2022 in [facility] Cardiac and Pulmonary Rehab  Referring Provider Dr. Dorothyann Peng       Initial Encounter Date:  Flowsheet Row Cardiac Rehab from 09/26/2022 in [facility] Cardiac and Pulmonary Rehab  Date 09/26/22       Visit Diagnosis: S/P CABG x 3  Patient's Home Medications on Admission:  Current Outpatient Medications:    acetaminophen (TYLENOL) 500 MG tablet, Take 2 tablets (1,000 mg total) by mouth every 8 (eight) hours. (Patient not taking: Reported on 08/12/2022), Disp: 30 tablet, Rfl: 0   Alpha-Lipoic Acid 200 MG TABS, Take by mouth daily. (Patient not taking: Reported on 09/15/2022), Disp: , Rfl:    aspirin (ASPIRIN 81) 81 MG chewable tablet, , Disp: , Rfl:    ASPIRIN 81 PO, Take by mouth daily. (Patient not taking: Reported on 09/15/2022), Disp: , Rfl:    Azelastine HCl 137 MCG/SPRAY SOLN, SMARTSIG:1-2 Spray(s) Both Nares Every 12 Hours PRN (Patient not taking: Reported on 09/15/2022), Disp: , Rfl:    B Complex Vitamins (VITAMIN B COMPLEX) TABS, Take 1 tablet by mouth daily. (Patient not taking: Reported on 09/20/2022), Disp: , Rfl:    Calcium Carb-Cholecalciferol (CALCIUM HIGH POTENCY/VITAMIN D) 600-5 MG-MCG TABS, Take by mouth., Disp: , Rfl:    calcium carbonate (OSCAL) 1500 (600 Ca) MG TABS tablet, Take by mouth 2 (two) times daily with a meal. (Patient not taking: Reported on 09/15/2022), Disp: , Rfl:    Cholecalciferol 1.25 MG (50000 UT) TABS, , Disp: , Rfl:    clindamycin (CLEOCIN) 150 MG capsule, TAKE 4 CAPS 1HR PRIOR TO DENTAL APPT WITH FOOD (Patient not taking: Reported on 09/15/2022), Disp: , Rfl:    Continuous Glucose Sensor (DEXCOM G6 SENSOR) MISC, Use 1 each every 10 (ten) days (Patient not taking: Reported on 09/15/2022), Disp: , Rfl:    Continuous Glucose Sensor (DEXCOM G7 SENSOR) MISC, Use 1 Device  every 10 (ten) days (Patient not taking: Reported on 09/15/2022), Disp: , Rfl:    Continuous Glucose Transmitter (DEXCOM G6 TRANSMITTER) MISC, See admin instructions., Disp: , Rfl:    cyanocobalamin 1000 MCG tablet, Take 1,000 mcg by mouth daily. (Patient not taking: Reported on 09/15/2022), Disp: , Rfl:    enalapril (VASOTEC) 2.5 MG tablet, Take 2.5 mg by mouth. (Patient not taking: Reported on 09/15/2022), Disp: , Rfl:    Enoxaparin Sodium (LOVENOX IJ), Lovenox (Patient not taking: Reported on 09/15/2022), Disp: , Rfl:    Enoxaparin Sodium (LOVENOX IJ), , Disp: , Rfl:    fluocinonide (LIDEX) 0.05 % external solution, Apply to aa of scalp qd/bid prn for itch bumps (Patient not taking: Reported on 09/15/2022), Disp: 60 mL, Rfl: 3   fluticasone (FLONASE) 50 MCG/ACT nasal spray, Place 2 sprays into both nostrils daily. (Patient not taking: Reported on 09/15/2022), Disp: , Rfl:    furosemide (LASIX) 20 MG tablet, PLEASE SEE ATTACHED FOR DETAILED DIRECTIONS (Patient not taking: Reported on 09/15/2022), Disp: , Rfl:    hydrochlorothiazide (HYDRODIURIL) 12.5 MG tablet, Take 12.5 mg by mouth daily. (Patient not taking: Reported on 09/15/2022), Disp: , Rfl:    HYDROcodone-acetaminophen (NORCO/VICODIN) 5-325 MG tablet, 1 PO Q 6hrs prn pain (Patient not taking: Reported on 09/15/2022), Disp: , Rfl:    influenza vaccine adjuvanted (FLUAD) 0.5 ML injection, Fluad 2016-17 106yr up(PF)45 mcg(15 mcgx3)/0.5 mL intramuscular syringe  ADM 0.5ML IM UTD (Patient not  taking: Reported on 09/15/2022), Disp: , Rfl:    influenza vaccine adjuvanted (FLUAD) 0.5 ML injection, ADM 0.5ML IM UTD (Patient not taking: Reported on 09/15/2022), Disp: , Rfl:    insulin lispro (HUMALOG) 100 UNIT/ML injection, Inject 30 Units into the skin once. Pt uses insulin pump-uses 100 units every 3 days, Disp: , Rfl:    isosorbide mononitrate (IMDUR) 30 MG 24 hr tablet, Take 30 mg by mouth daily., Disp: , Rfl:    KLOR-CON M10 10 MEQ tablet, PLEASE SEE ATTACHED  FOR DETAILED DIRECTIONS (Patient not taking: Reported on 09/15/2022), Disp: , Rfl:    KLOR-CON M20 20 MEQ tablet, Take by mouth. (Patient not taking: Reported on 09/15/2022), Disp: , Rfl:    metoCLOPramide (REGLAN) 10 MG tablet, Take 10 mg by mouth 3 (three) times daily., Disp: , Rfl:    metolazone (ZAROXOLYN) 2.5 MG tablet, Take by mouth. (Patient not taking: Reported on 09/15/2022), Disp: , Rfl:    metoprolol succinate (TOPROL-XL) 25 MG 24 hr tablet, Take 12.5 mg by mouth at bedtime., Disp: , Rfl:    metoprolol tartrate (LOPRESSOR) 25 MG tablet, Take 25 mg by mouth 2 (two) times daily., Disp: , Rfl:    mometasone (NASONEX) 50 MCG/ACT nasal spray, Place 2 sprays into the nose daily. (Patient not taking: Reported on 09/15/2022), Disp: , Rfl:    nitroGLYCERIN (NITROSTAT) 0.4 MG SL tablet, PLEASE SEE ATTACHED FOR DETAILED DIRECTIONS, Disp: , Rfl:    omeprazole (PRILOSEC) 40 MG capsule, Take 40 mg by mouth daily., Disp: , Rfl:    ondansetron (ZOFRAN) 4 MG tablet, Take 2 tablets twice a day by oral route. (Patient not taking: Reported on 09/15/2022), Disp: , Rfl:    ondansetron (ZOFRAN-ODT) 4 MG disintegrating tablet, Take 4 mg by mouth every 8 (eight) hours as needed. (Patient not taking: Reported on 09/15/2022), Disp: , Rfl:    oxyCODONE (OXY IR/ROXICODONE) 5 MG immediate release tablet, Take 5 mg by mouth every 6 (six) hours as needed. (Patient not taking: Reported on 09/15/2022), Disp: , Rfl:    polyethylene glycol (MIRALAX / GLYCOLAX) 17 g packet, Take by mouth., Disp: , Rfl:    Potassium Chloride ER 20 MEQ TBCR, Take by mouth., Disp: , Rfl:    rosuvastatin (CRESTOR) 40 MG tablet, Take by mouth., Disp: , Rfl:    senna-docusate (SENOKOT-S) 8.6-50 MG tablet, Take by mouth., Disp: , Rfl:    Sennosides (SENNA) 8.6 MG CAPS, senna (Patient not taking: Reported on 09/15/2022), Disp: , Rfl:    Sennosides (SENNA-TIME PO), senna (Patient not taking: Reported on 09/15/2022), Disp: , Rfl:    Sennosides (SENNA-TIME  PO), senna (Patient not taking: Reported on 09/15/2022), Disp: , Rfl:    Sennosides (SENNA-TIME PO), , Disp: , Rfl:    simvastatin (ZOCOR) 40 MG tablet, Take 40 mg by mouth daily. (Patient not taking: Reported on 09/15/2022), Disp: , Rfl:    triamcinolone ointment (KENALOG) 0.1 %, Apply topically 2 (two) times daily. (Patient not taking: Reported on 09/15/2022), Disp: , Rfl:   Past Medical History: Past Medical History:  Diagnosis Date   Actinic keratosis 09/28/2020   upper sternum, bx   Barrett's esophagus    Basal cell carcinoma 11/13/2007   Left nasal root. Excised 12/07/2007, margins free.   Cancer (HCC)    skin   Diabetes mellitus type 1 (HCC)    Medtronic Medimed Insulin pump   Dysplastic nevus 04/07/2009   RLQA. Mild to moderate atypia, edges free.   Essential (primary) hypertension  Female dyspareunia    GERD (gastroesophageal reflux disease)    Barret's esphagus   Hematuria    Hyperlipidemia    Hypertension    Impaired hearing    left ear hearing loss, tinnitis   Menopause    Microscopic colitis    Nasal polyps    Osteoarthritis    right hip, back   Seasonal allergies    Sinusitis    Vertigo    none recently   Wears hearing aid in both ears     Tobacco Use: Social History   Tobacco Use  Smoking Status Never  Smokeless Tobacco Never    Labs: Review Flowsheet       Latest Ref Rng & Units 03/26/2015 08/03/2015 08/12/2022  Labs for ITP Cardiac and Pulmonary Rehab  Hemoglobin A1c 4.8 - 5.6 % 6.8  7.2  -  PH, Arterial 7.35 - 7.45 - - 7.392   PCO2 arterial 32 - 48 mmHg - - 38.1   Bicarbonate 20.0 - 28.0 mmol/L - - 23.2  23.5   TCO2 22 - 32 mmol/L - - 24  25   Acid-base deficit 0.0 - 2.0 mmol/L - - 2.0  3.0   O2 Saturation % - - 92  63     Details       Multiple values from one day are sorted in reverse-chronological order          Exercise Target Goals: Exercise Program Goal: Individual exercise prescription set using results from initial 6 min  walk test and THRR while considering  patient's activity barriers and safety.   Exercise Prescription Goal: Initial exercise prescription builds to 30-45 minutes a day of aerobic activity, 2-3 days per week.  Home exercise guidelines will be given to patient during program as part of exercise prescription that the participant will acknowledge.   Education: Aerobic Exercise: - Group verbal and visual presentation on the components of exercise prescription. Introduces F.I.T.T principle from ACSM for exercise prescriptions.  Reviews F.I.T.T. principles of aerobic exercise including progression. Written material given at graduation.   Education: Resistance Exercise: - Group verbal and visual presentation on the components of exercise prescription. Introduces F.I.T.T principle from ACSM for exercise prescriptions  Reviews F.I.T.T. principles of resistance exercise including progression. Written material given at graduation. Flowsheet Row Cardiac Rehab from 10/26/2022 in [facility] Cardiac and Pulmonary Rehab  Date 10/26/22  Educator [facility]  Instruction Review Code 1- [employer] Understanding        Education: Exercise & Equipment Safety: - Individual verbal instruction and demonstration of equipment use and safety with use of the equipment. Flowsheet Row Cardiac Rehab from 10/26/2022 in [facility] Cardiac and Pulmonary Rehab  Date 09/26/22  Educator [facility]  Instruction Review Code 1- Verbalizes Understanding       Education: Exercise Physiology & General Exercise Guidelines: - Group verbal and written instruction with models to review the exercise physiology of the cardiovascular system and associated critical values. Provides general exercise guidelines with specific guidelines to those with heart or lung disease.  Flowsheet Row Cardiac Rehab from 10/26/2022 in [facility] Cardiac and Pulmonary Rehab  Date 10/19/22  Educator [facility]  Instruction Review Code 1- [employer] Understanding       Education:  Flexibility, Balance, Mind/Body Relaxation: - Group verbal and visual presentation with interactive activity on the components of exercise prescription. Introduces F.I.T.T principle from ACSM for exercise prescriptions. Reviews F.I.T.T. principles of flexibility and balance exercise training including progression. Also discusses the mind body connection.  Reviews various relaxation  techniques to help reduce and manage stress (i.e. Deep breathing, progressive muscle relaxation, and visualization). Balance handout provided to take home. Written material given at graduation. Flowsheet Row Cardiac Rehab from 10/26/2022 in [facility] Cardiac and Pulmonary Rehab  Date 10/26/22  Educator [facility]  Instruction Review Code 1- Verbalizes Understanding       Activity Barriers & Risk Stratification:  Activity Barriers & Cardiac Risk Stratification - 09/26/22 1529       Activity Barriers & Cardiac Risk Stratification   Activity Barriers Other (comment)    Comments still under lifting restrictions post op and history of right hip fracture and repair (currently no pain just limited ROM)    Cardiac Risk Stratification High             6 Minute Walk:  6 Minute Walk     Row Name 09/26/22 1528         6 Minute Walk   Phase Initial     Distance 1300 feet     Walk Time 6 minutes     # of Rest Breaks 0     MPH 2.46     METS 2.81     RPE 12     Perceived Dyspnea  1     VO2 Peak 9.85     Symptoms No     Resting HR 72 bpm     Resting BP 110/62     Resting Oxygen Saturation  99 %     Exercise Oxygen Saturation  during 6 min walk 99 %     Max Ex. HR 97 bpm     Max Ex. BP 138/70     2 Minute Post BP 118/70              Oxygen Initial Assessment:   Oxygen Re-Evaluation:   Oxygen Discharge (Final Oxygen Re-Evaluation):   Initial Exercise Prescription:  Initial Exercise Prescription - 09/26/22 1500       Date of Initial Exercise RX and Referring Provider   Date 09/26/22    Referring  Provider Dr. Dorothyann Peng      Oxygen   Maintain Oxygen Saturation 88% or higher      Treadmill   MPH 2    Grade 1    Minutes 15    METs 2.86      NuStep   Level 2   T6   SPM 80    Minutes 15    METs 2.81      REL-XR   Level 2    Speed 50    Minutes 15    METs 2.81      Biostep-RELP   Level 2    SPM 50    Minutes 15    METs 2.81      Track   Laps 30    Minutes 15    METs 2.63      Prescription Details   Frequency (times per week) 3    Duration Progress to 30 minutes of continuous aerobic without signs/symptoms of physical distress      Intensity   THRR 40-80% of Max Heartrate 99-127    Ratings of Perceived Exertion 11-13    Perceived Dyspnea 0-4      Progression   Progression Continue to progress workloads to maintain intensity without signs/symptoms of physical distress.      Resistance Training   Training Prescription Yes    Weight 1    Reps 10-15  Perform Capillary Blood Glucose checks as needed.  Exercise Prescription Changes:   Exercise Prescription Changes     Row Name 09/26/22 1500 10/12/22 1000 10/27/22 0700         Response to Exercise   Blood Pressure (Admit) 110/62 130/60 122/60     Blood Pressure (Exercise) 138/70 134/60 128/54     Blood Pressure (Exit) 118/70 122/60 98/58     Heart Rate (Admit) 72 bpm 74 bpm 67 bpm     Heart Rate (Exercise) 97 bpm 96 bpm 94 bpm     Heart Rate (Exit) 77 bpm 80 bpm 72 bpm     Oxygen Saturation (Admit) 99 % -- --     Oxygen Saturation (Exercise) 99 % -- --     Oxygen Saturation (Exit) 98 % -- --     Rating of Perceived Exertion (Exercise) 12 15 13      Perceived Dyspnea (Exercise) 1 0 --     Symptoms none none none     Comments 6 MWT results First 2 weeks of exercise --     Duration -- Progress to 30 minutes of  aerobic without signs/symptoms of physical distress Progress to 30 minutes of  aerobic without signs/symptoms of physical distress     Intensity -- THRR unchanged THRR  unchanged       Progression   Progression -- Continue to progress workloads to maintain intensity without signs/symptoms of physical distress. Continue to progress workloads to maintain intensity without signs/symptoms of physical distress.     Average METs -- 2.07 2.4       Resistance Training   Training Prescription -- Yes Yes     Weight -- 1 1 lb     Reps -- 10-15 10-15       Interval Training   Interval Training -- No No       Treadmill   MPH -- -- 1.9     Grade -- -- 0     Minutes -- -- 15     METs -- -- 2.45       Recumbant Bike   Level -- 1 --     Watts -- 25 --     Minutes -- 15 --     METs -- 3.34 --       NuStep   Level -- 2 4  T6 nustep     Minutes -- 15 15     METs -- 1.5 2       REL-XR   Level -- -- 2     Minutes -- -- 15     METs -- -- 5.4       T5 Nustep   Level -- 1  T6 --     Minutes -- 15 --     METs -- 1.7 --       Biostep-RELP   Level -- 2 2     Minutes -- 15 15     METs -- 2 3       Track   Laps -- 22 --     Minutes -- 15 --     METs -- 2.2 --       Oxygen   Maintain Oxygen Saturation -- 88% or higher 88% or higher              Exercise Comments:   Exercise Comments     Row Name 09/29/22 1704           Exercise Comments First  full day of exercise!  Patient was oriented to gym and equipment including functions, settings, policies, and procedures.  Patient's individual exercise prescription and treatment plan were reviewed.  All starting workloads were established based on the results of the 6 minute walk test done at initial orientation visit.  The plan for exercise progression was also introduced and progression will be customized based on patient's performance and goals.                Exercise Goals and Review:   Exercise Goals     Row Name 09/26/22 1546             Exercise Goals   Increase Physical Activity Yes       Intervention Provide advice, education, support and counseling about physical  activity/exercise needs.;Develop an individualized exercise prescription for aerobic and resistive training based on initial evaluation findings, risk stratification, comorbidities and participant's personal goals.       Expected Outcomes Short Term: Attend rehab on a regular basis to increase amount of physical activity.;Long Term: Add in home exercise to make exercise part of routine and to increase amount of physical activity.;Long Term: Exercising regularly at least 3-5 days a week.       Increase Strength and Stamina Yes       Intervention Provide advice, education, support and counseling about physical activity/exercise needs.;Develop an individualized exercise prescription for aerobic and resistive training based on initial evaluation findings, risk stratification, comorbidities and participant's personal goals.       Expected Outcomes Short Term: Increase workloads from initial exercise prescription for resistance, speed, and METs.;Short Term: Perform resistance training exercises routinely during rehab and add in resistance training at home;Long Term: Improve cardiorespiratory fitness, muscular endurance and strength as measured by increased METs and functional capacity ( )       Able to understand and use rate of perceived exertion (RPE) scale Yes       Intervention Provide education and explanation on how to use RPE scale       Expected Outcomes Short Term: Able to use RPE daily in rehab to express subjective intensity level;Long Term:  Able to use RPE to guide intensity level when exercising independently       Able to understand and use Dyspnea scale Yes       Intervention Provide education and explanation on how to use Dyspnea scale       Expected Outcomes Short Term: Able to use Dyspnea scale daily in rehab to express subjective sense of shortness of breath during exertion;Long Term: Able to use Dyspnea scale to guide intensity level when exercising independently       Knowledge and  understanding of Target Heart Rate Range (THRR) Yes       Intervention Provide education and explanation of THRR including how the numbers were predicted and where they are located for reference       Expected Outcomes Short Term: Able to state/look up THRR;Long Term: Able to use THRR to govern intensity when exercising independently;Short Term: Able to use daily as guideline for intensity in rehab       Able to check pulse independently Yes       Intervention Provide education and demonstration on how to check pulse in carotid and radial arteries.;Review the importance of being able to check your own pulse for safety during independent exercise       Expected Outcomes Short Term: Able to explain why pulse checking is important during independent  exercise;Long Term: Able to check pulse independently and accurately       Understanding of Exercise Prescription Yes       Intervention Provide education, explanation, and written materials on patient's individual exercise prescription       Expected Outcomes Short Term: Able to explain program exercise prescription;Long Term: Able to explain home exercise prescription to exercise independently                Exercise Goals Re-Evaluation :  Exercise Goals Re-Evaluation     Row Name 09/29/22 1704 10/12/22 1056 10/27/22 0804         Exercise Goal Re-Evaluation   Exercise Goals Review Increase Physical Activity;Able to understand and use rate of perceived exertion (RPE) scale;Knowledge and understanding of Target Heart Rate Range (THRR);Understanding of Exercise Prescription;Increase Strength and Stamina;Able to check pulse independently Increase Physical Activity;Increase Strength and Stamina;Understanding of Exercise Prescription Increase Physical Activity;Increase Strength and Stamina;Understanding of Exercise Prescription     Comments Reviewed RPE and dyspnea scale, THR and program prescription with pt today.  Pt voiced understanding and was given  a copy of goals to take home. Deeanna is off to a good start in the program. She has already increased her level on the T4 nustep, and Biostep from level 1 to 2. We will continue to monitor her progress in the program. Sindee is doing well in rehab. She has maintained her workloads on most machines although she did improve from level 2 to level 4 on the T6 nustep. She also did increase her speed on the treadmill from 1.8 mph to 1.9 mph with no incline. We will continue to monitor her progress in the program.     Expected Outcomes Short: Use RPE daily to regulate intensity.  Long: Follow program prescription in THR. Short: Continue to follow current exercise prescription. Long: Continue exercise to improve strength and stamina. Short: Continue to progressively increase treadmill workload. Long: Continue exercise to improve strength and stamina.              Discharge Exercise Prescription (Final Exercise Prescription Changes):  Exercise Prescription Changes - 10/27/22 0700       Response to Exercise   Blood Pressure (Admit) 122/60    Blood Pressure (Exercise) 128/54    Blood Pressure (Exit) 98/58    Heart Rate (Admit) 67 bpm    Heart Rate (Exercise) 94 bpm    Heart Rate (Exit) 72 bpm    Rating of Perceived Exertion (Exercise) 13    Symptoms none    Duration Progress to 30 minutes of  aerobic without signs/symptoms of physical distress    Intensity THRR unchanged      Progression   Progression Continue to progress workloads to maintain intensity without signs/symptoms of physical distress.    Average METs 2.4      Resistance Training   Training Prescription Yes    Weight 1 lb    Reps 10-15      Interval Training   Interval Training No      Treadmill   MPH 1.9    Grade 0    Minutes 15    METs 2.45      NuStep   Level 4   T6 nustep   Minutes 15    METs 2      REL-XR   Level 2    Minutes 15    METs 5.4      Biostep-RELP   Level 2    Minutes 15  METs 3      Oxygen    Maintain Oxygen Saturation 88% or higher             Nutrition:  Target Goals: Understanding of nutrition guidelines, daily intake of sodium 1500mg , cholesterol 200mg , calories 30% from fat and 7% or less from saturated fats, daily to have 5 or more servings of fruits and vegetables.  Education: All About Nutrition: -Group instruction provided by verbal, written material, interactive activities, discussions, models, and posters to present general guidelines for heart healthy nutrition including fat, fiber, MyPlate, the role of sodium in heart healthy nutrition, utilization of the nutrition label, and utilization of this knowledge for meal planning. Follow up email sent as well. Written material given at graduation. Flowsheet Row Cardiac Rehab from 10/26/2022 in [facility] Cardiac and Pulmonary Rehab  Education need identified 09/26/22       Biometrics:  Pre Biometrics - 09/26/22 1547       Pre Biometrics   Height 5\' 6"  (1.676 m)    Weight 125 lb 14.4 oz (57.1 kg)    Waist Circumference 31 inches    Hip Circumference 39.5 inches    Waist to Hip Ratio 0.78 %    BMI (Calculated) 20.33    Single Leg Stand 5.62 seconds              Nutrition Therapy Plan and Nutrition Goals:  Nutrition Therapy & Goals - 09/26/22 1640       Nutrition Therapy   Diet Carb controlled, cardiac    Protein (specify units) 75    Fiber 25 grams    Whole Grain Foods 3 servings    Saturated Fats 15 max. grams    Fruits and Vegetables 5 servings/day    Sodium 2 grams      Personal Nutrition Goals   Nutrition Goal Eat 3-5 small meal/snacks per day    Personal Goal #2 Measure carbs per meal, dose insulin and pair carbs with protein or healthy fats    Personal Goal #3 Try greek yogurt or protein shake    Comments Patient reports her appetite has gone down a lot over the past ~3-37months. She says that she often only eats half her plate, says she doses her insulin pump before eating. Recently she has  dosed her insulin for the entire meal then only been able to eat half. Spoke to her about set out portions she can eat, even if small. Educated on the smaller portions of nutrient dense foods and how she could meet goals even with her lesser appetite. Tuna, cheese, peanut butter, beans, lentils, protein shake, chicken, avocado, ect as some examples of foods with lots of nutrition in smaller quantities. Reviewed heart healthy guidelines, but main goals are on feeding her body with her lesser appetite, choosing calorie dense foods, aiming for smaller more frequent meals and making sure blood sugars stay stable, not dropping due to too much insulin and not enough carbs. Educated on simple sugars and complex carbs, encouraged pairing carbs with protein or healthy fat. Built out several meals and snacks with foods she likes and will eat, focusing small portions, calorie dense foods, adequate protein and blood sugar control.      Intervention Plan   Intervention Prescribe, educate and counsel regarding individualized specific dietary modifications aiming towards targeted core components such as weight, hypertension, lipid management, diabetes, heart failure and other comorbidities.;Nutrition handout(s) given to patient.    Expected Outcomes Short Term Goal: Understand basic principles of dietary  content, such as calories, fat, sodium, cholesterol and nutrients.;Short Term Goal: A plan has been developed with personal nutrition goals set during dietitian appointment.;Long Term Goal: Adherence to prescribed nutrition plan.             Nutrition Assessments:  MEDIFICTS Score Key: >=70 Need to make dietary changes  40-70 Heart Healthy Diet <= 40 Therapeutic Level Cholesterol Diet  Flowsheet Row Cardiac Rehab from 09/26/2022 in [facility] Cardiac and Pulmonary Rehab  Picture Your Plate Total Score on Admission 60      Picture Your Plate Scores: <62 Unhealthy dietary pattern with much room for  improvement. 41-50 Dietary pattern unlikely to meet recommendations for good health and room for improvement. 51-60 More healthful dietary pattern, with some room for improvement.  >60 Healthy dietary pattern, although there may be some specific behaviors that could be improved.    Nutrition Goals Re-Evaluation:  Nutrition Goals Re-Evaluation     Row Name 10/19/22 1614             Goals   Nutrition Goal Eat 3-5 small meal/snacks per day       Comment Necole voices concerns with poor appetite. She cannot think of any foods that she enjoys at this time. She is working to try to incr snacks/meals to 3-5 /day but she is going to begin to drink protein shakes especially when she has little appetite to ensure she is getting more calories and protein needed. She does not drink much water, but does drink tea with artificial sweetner. We discussed replacing at least one tea drink with a glass of water.       Expected Outcome Short:  Try drinking protein shake when appetite is poor, replace tea with one glass of water/day  Long:  Maintain healthy diet/lifestyle         Personal Goal #2 Re-Evaluation   Personal Goal #2 Measure carbs per meal, dose insulin and pair carbs with protein or healthy fats         Personal Goal #3 Re-Evaluation   Personal Goal #3 Try greek yogurt or protein shake                Nutrition Goals Discharge (Final Nutrition Goals Re-Evaluation):  Nutrition Goals Re-Evaluation - 10/19/22 1614       Goals   Nutrition Goal Eat 3-5 small meal/snacks per day    Comment Nada voices concerns with poor appetite. She cannot think of any foods that she enjoys at this time. She is working to try to incr snacks/meals to 3-5 /day but she is going to begin to drink protein shakes especially when she has little appetite to ensure she is getting more calories and protein needed. She does not drink much water, but does drink tea with artificial sweetner. We discussed replacing at  least one tea drink with a glass of water.    Expected Outcome Short:  Try drinking protein shake when appetite is poor, replace tea with one glass of water/day  Long:  Maintain healthy diet/lifestyle      Personal Goal #2 Re-Evaluation   Personal Goal #2 Measure carbs per meal, dose insulin and pair carbs with protein or healthy fats      Personal Goal #3 Re-Evaluation   Personal Goal #3 Try greek yogurt or protein shake             Psychosocial: Target Goals: Acknowledge presence or absence of significant depression and/or stress, maximize coping skills, provide positive support system.  Participant is able to verbalize types and ability to use techniques and skills needed for reducing stress and depression.   Education: Stress, Anxiety, and Depression - Group verbal and visual presentation to define topics covered.  Reviews how body is impacted by stress, anxiety, and depression.  Also discusses healthy ways to reduce stress and to treat/manage anxiety and depression.  Written material given at graduation. Flowsheet Row Cardiac Rehab from 10/26/2022 in [facility] Cardiac and Pulmonary Rehab  Date 10/12/22  Educator SB  Instruction Review Code 1- [employer] Understanding       Education: Sleep Hygiene -Provides group verbal and written instruction about how sleep can affect your health.  Define sleep hygiene, discuss sleep cycles and impact of sleep habits. Review good sleep hygiene tips.    Initial Review & Psychosocial Screening:  Initial Psych Review & Screening - 09/15/22 1024       Initial Review   Current issues with None Identified      Family Dynamics   Good Support System? Yes    Comments She can look to her husband for support. Ellasandra is feeling better now that she is getting further away from surgery. She is ready to start rehab and get more strength.      Barriers   Psychosocial barriers to participate in program The patient should benefit from training in stress  management and relaxation.;There are no identifiable barriers or psychosocial needs.      Screening Interventions   Interventions Encouraged to exercise;To provide support and resources with identified psychosocial needs;Provide feedback about the scores to participant    Expected Outcomes Short Term goal: Utilizing psychosocial counselor, staff and physician to assist with identification of specific Stressors or current issues interfering with healing process. Setting desired goal for each stressor or current issue identified.;Long Term Goal: Stressors or current issues are controlled or eliminated.;Short Term goal: Identification and review with participant of any Quality of Life or Depression concerns found by scoring the questionnaire.;Long Term goal: The participant improves quality of Life and PHQ9 Scores as seen by post scores and/or verbalization of changes             Quality of Life Scores:   Quality of Life - 09/26/22 1548       Quality of Life   Select Quality of Life      Quality of Life Scores   Health/Function Pre 19.6 %    Socioeconomic Pre 22.13 %    Psych/Spiritual Pre 29.14 %    Family Pre 23.4 %    GLOBAL Pre 22.63 %            Scores of 19 and below usually indicate a poorer quality of life in these areas.  A difference of  2-3 points is a clinically meaningful difference.  A difference of 2-3 points in the total score of the Quality of Life Index has been associated with significant improvement in overall quality of life, self-image, physical symptoms, and general health in studies assessing change in quality of life.  PHQ-9: Review Flowsheet       10/31/2022 09/26/2022  Depression screen PHQ 2/9  Decreased Interest 0 1  Down, Depressed, Hopeless 0 1  PHQ - 2 Score 0 2  Altered sleeping 1 1  Tired, decreased energy 1 2  Change in appetite 0 1  Feeling bad or failure about yourself  0 0  Trouble concentrating 0 0  Moving slowly or fidgety/restless 0  0  Suicidal thoughts 0 0  PHQ-9 Score 2 6  Difficult doing work/chores Not difficult at all Somewhat difficult    Details           Interpretation of Total Score  Total Score Depression Severity:  1-4 = Minimal depression, 5-9 = Mild depression, 10-14 = Moderate depression, 15-19 = Moderately severe depression, 20-27 = Severe depression   Psychosocial Evaluation and Intervention:  Psychosocial Evaluation - 09/15/22 1025       Psychosocial Evaluation & Interventions   Interventions Encouraged to exercise with the program and follow exercise prescription;Stress management education;Relaxation education    Comments She can look to her husband for support. Brileigh is feeling better now that she is getting further away from surgery. She is ready to start rehab and get more strength.    Expected Outcomes Short: Start [facility] to help with mood. Long: Maintain a healthy mental state    Continue Psychosocial Services  Follow up required by staff             Psychosocial Re-Evaluation:  Psychosocial Re-Evaluation     Row Name 10/19/22 1610 10/31/22 1552           Psychosocial Re-Evaluation   Current issues with Current Stress Concerns None Identified      Comments Penelopy "worries about so many things" especially things she hears on tv/news. She is working on decreasing outside stressors by not watching the news and cutting out other things that she knows increase her stress. She will continue to exercise that will also help improve overall health including psychosocial health. Reviewed patient health questionnaire (PHQ-9) with patient for follow up. Previously, patients score indicated signs/symptoms of depression.  Reviewed to see if patient is improving symptom wise while in program.  Score improved and patient states that it is because she has been able to get out and garden more.      Expected Outcomes Short:  Continue to cut out stressors at home, continue exercise  Long:   Maintain healthy mental state Short: Continue to attend [facility] regularly for regular exercise and social engagement. Long: Continue to improve symptoms and manage a positive mental state.      Interventions Encouraged to attend Pulmonary Rehabilitation for the exercise Encouraged to attend Cardiac Rehabilitation for the exercise      Continue Psychosocial Services  Follow up required by staff Follow up required by staff               Psychosocial Discharge (Final Psychosocial Re-Evaluation):  Psychosocial Re-Evaluation - 10/31/22 1552       Psychosocial Re-Evaluation   Current issues with None Identified    Comments Reviewed patient health questionnaire (PHQ-9) with patient for follow up. Previously, patients score indicated signs/symptoms of depression.  Reviewed to see if patient is improving symptom wise while in program.  Score improved and patient states that it is because she has been able to get out and garden more.    Expected Outcomes Short: Continue to attend [facility] regularly for regular exercise and social engagement. Long: Continue to improve symptoms and manage a positive mental state.    Interventions Encouraged to attend Cardiac Rehabilitation for the exercise    Continue Psychosocial Services  Follow up required by staff             Vocational Rehabilitation: Provide vocational rehab assistance to qualifying candidates.   Vocational Rehab Evaluation & Intervention:   Education: Education Goals: Education classes will be provided on a variety of topics geared toward better understanding  of heart health and risk factor modification. Participant will state understanding/return demonstration of topics presented as noted by education test scores.  Learning Barriers/Preferences:  Learning Barriers/Preferences - 09/15/22 1021       Learning Barriers/Preferences   Learning Barriers None    Learning Preferences None             General Cardiac  Education Topics:  AED/CPR: - Group verbal and written instruction with the use of models to demonstrate the basic use of the AED with the basic ABC's of resuscitation.   Anatomy and Cardiac Procedures: - Group verbal and visual presentation and models provide information about basic cardiac anatomy and function. Reviews the testing methods done to diagnose heart disease and the outcomes of the test results. Describes the treatment choices: Medical Management, Angioplasty, or Coronary Bypass Surgery for treating various heart conditions including Myocardial Infarction, Angina, Valve Disease, and Cardiac Arrhythmias.  Written material given at graduation.   Medication Safety: - Group verbal and visual instruction to review commonly prescribed medications for heart and lung disease. Reviews the medication, class of the drug, and side effects. Includes the steps to properly store meds and maintain the prescription regimen.  Written material given at graduation.   Intimacy: - Group verbal instruction through game format to discuss how heart and lung disease can affect sexual intimacy. Written material given at graduation..   Know Your Numbers and Heart Failure: - Group verbal and visual instruction to discuss disease risk factors for cardiac and pulmonary disease and treatment options.  Reviews associated critical values for Overweight/Obesity, Hypertension, Cholesterol, and Diabetes.  Discusses basics of heart failure: signs/symptoms and treatments.  Introduces Heart Failure Zone chart for action plan for heart failure.  Written material given at graduation. Flowsheet Row Cardiac Rehab from 10/26/2022 in [facility] Cardiac and Pulmonary Rehab  Date 10/05/22  Educator SB  Instruction Review Code 1- Verbalizes Understanding       Infection Prevention: - Provides verbal and written material to individual with discussion of infection control including proper hand washing and proper equipment cleaning  during exercise session. Flowsheet Row Cardiac Rehab from 10/26/2022 in [facility] Cardiac and Pulmonary Rehab  Date 09/26/22  Educator [facility]  Instruction Review Code 1- Verbalizes Understanding       Falls Prevention: - Provides verbal and written material to individual with discussion of falls prevention and safety. Flowsheet Row Cardiac Rehab from 10/26/2022 in [facility] Cardiac and Pulmonary Rehab  Date 09/26/22  Educator [facility]  Instruction Review Code 1- Verbalizes Understanding       Other: -Provides group and verbal instruction on various topics (see comments)   Knowledge Questionnaire Score:  Knowledge Questionnaire Score - 09/26/22 1549       Knowledge Questionnaire Score   Pre Score 25/26             Core Components/Risk Factors/Patient Goals at Admission:  Personal Goals and Risk Factors at Admission - 09/26/22 1549       Core Components/Risk Factors/Patient Goals on Admission    Weight Management Weight Maintenance    Intervention Weight Management: Develop a combined nutrition and exercise program designed to reach desired caloric intake, while maintaining appropriate intake of nutrient and fiber, sodium and fats, and appropriate energy expenditure required for the weight goal.;Weight Management: Provide education and appropriate resources to help participant work on and attain dietary goals.;Weight Management/Obesity: Establish reasonable short term and long term weight goals.    Expected Outcomes Short Term: Continue to assess and modify interventions  until short term weight is achieved;Long Term: Adherence to nutrition and physical activity/exercise program aimed toward attainment of established weight goal;Understanding recommendations for meals to include 15-35% energy as protein, 25-35% energy from fat, 35-60% energy from carbohydrates, less than 200mg  of dietary cholesterol, 20-35 gm of total fiber daily;Understanding of distribution of calorie intake throughout the day  with the consumption of 4-5 meals/snacks;Weight Maintenance: Understanding of the daily nutrition guidelines, which includes 25-35% calories from fat, 7% or less cal from saturated fats, less than 200mg  cholesterol, less than 1.5gm of sodium, & 5 or more servings of fruits and vegetables daily    Diabetes Yes    Intervention Provide education about signs/symptoms and action to take for hypo/hyperglycemia.;Provide education about proper nutrition, including hydration, and aerobic/resistive exercise prescription along with prescribed medications to achieve blood glucose in normal ranges: Fasting glucose 65-99 mg/dL    Expected Outcomes Short Term: Participant verbalizes understanding of the signs/symptoms and immediate care of hyper/hypoglycemia, proper foot care and importance of medication, aerobic/resistive exercise and nutrition plan for blood glucose control.;Long Term: Attainment of HbA1C < 7%.    Hypertension Yes    Intervention Provide education on lifestyle modifcations including regular physical activity/exercise, weight management, moderate sodium restriction and increased consumption of fresh fruit, vegetables, and low fat dairy, alcohol moderation, and smoking cessation.;Monitor prescription use compliance.    Expected Outcomes Short Term: Continued assessment and intervention until BP is < 140/79mm HG in hypertensive participants. < 130/47mm HG in hypertensive participants with diabetes, heart failure or chronic kidney disease.;Long Term: Maintenance of blood pressure at goal levels.    Lipids Yes    Intervention Provide education and support for participant on nutrition & aerobic/resistive exercise along with prescribed medications to achieve LDL 70mg , HDL >40mg .    Expected Outcomes Short Term: Participant states understanding of desired cholesterol values and is compliant with medications prescribed. Participant is following exercise prescription and nutrition guidelines.;Long Term:  Cholesterol controlled with medications as prescribed, with individualized exercise RX and with personalized nutrition plan. Value goals: LDL < 70mg , HDL > 40 mg.             Education:Diabetes - Individual verbal and written instruction to review signs/symptoms of diabetes, desired ranges of glucose level fasting, after meals and with exercise. Acknowledge that pre and post exercise glucose checks will be done for 3 sessions at entry of program. Flowsheet Row Cardiac Rehab from 10/26/2022 in [facility] Cardiac and Pulmonary Rehab  Date 09/26/22  Educator [facility]  Instruction Review Code 1- Verbalizes Understanding       Core Components/Risk Factors/Patient Goals Review:   Goals and Risk Factor Review     Row Name 10/19/22 1600             Core Components/Risk Factors/Patient Goals Review   Personal Goals Review Diabetes       Review Somara continues to do well in rehab. She has been sleeping late and struggles with low energy. Her blood glucose is managed by her insulin pump and states her readings have varied more than normal recently. She reports poor appetite despite trying to implement recommendations to incr snacks/meals to 3-5 per day. She voices understanding of signs and symptoms of hypoglycemia and her husband also helps her manage. Her monitor will turn off insulin when readings become too low. Discussed plan to increase protein shake intake when she has poor appetite to help manage glucose as well. Discussed trying to walk some after she wakes up in the mornings and  to increase water intake that may help with her energy levels. Continue rehab to help improve energy as well.       Expected Outcomes Short: Continue rehab and walking some at home to help improve energy; implementing nutrition goals to aid in better glucose management  Long:  Independantly manage risk factors                Core Components/Risk Factors/Patient Goals at Discharge (Final Review):   Goals and Risk  Factor Review - 10/19/22 1600       Core Components/Risk Factors/Patient Goals Review   Personal Goals Review Diabetes    Review Monifa continues to do well in rehab. She has been sleeping late and struggles with low energy. Her blood glucose is managed by her insulin pump and states her readings have varied more than normal recently. She reports poor appetite despite trying to implement recommendations to incr snacks/meals to 3-5 per day. She voices understanding of signs and symptoms of hypoglycemia and her husband also helps her manage. Her monitor will turn off insulin when readings become too low. Discussed plan to increase protein shake intake when she has poor appetite to help manage glucose as well. Discussed trying to walk some after she wakes up in the mornings and to increase water intake that may help with her energy levels. Continue rehab to help improve energy as well.    Expected Outcomes Short: Continue rehab and walking some at home to help improve energy; implementing nutrition goals to aid in better glucose management  Long:  Independantly manage risk factors             ITP Comments:  ITP Comments     Row Name 09/15/22 1019 09/26/22 1527 09/29/22 1704 10/05/22 1216 11/02/22 1308   ITP Comments Virtual Visit completed. Patient informed on EP and RD appointment and 6 Minute walk test. Patient also informed of patient health questionnaires on My Chart. Patient Verbalizes understanding. Visit diagnosis can be found in [facility] 08/23/2022. Completed and gym orientation. Initial ITP created and sent for review to Dr. Bethann Punches, Medical Director. First full day of exercise!  Patient was oriented to gym and equipment including functions, settings, policies, and procedures.  Patient's individual exercise prescription and treatment plan were reviewed.  All starting workloads were established based on the results of the 6 minute walk test done at initial orientation visit.  The plan for  exercise progression was also introduced and progression will be customized based on patient's performance and goals. 30 Day review completed. Medical Director ITP review done, changes made as directed, and signed approval by Medical Director.    new to program 30 Day review completed. Medical Director ITP review done, changes made as directed, and signed approval by Medical Director.            Comments:

## 2022-11-02 NOTE — Progress Notes (Signed)
Daily Session Note  Patient Details  Name: Cassandra Joyce MRN: [id number] Date of Birth: Jan 28, 1945 Referring Provider:   Flowsheet Row Cardiac Rehab from 09/26/2022 in [facility] Cardiac and Pulmonary Rehab  Referring Provider Dr. Dorothyann Peng       Encounter Date: 11/02/2022  Check In:  Session Check In - 11/02/22 1617       Check-In   Supervising physician immediately available to respond to emergencies See telemetry face sheet for immediately available ER MD    Location [facility]-Cardiac & Pulmonary Rehab    Staff Present Cora Collum, RN, BSN, CCRP;Joseph Farmington, RCP,RRT,BSRT;Meredith Havana, RN Atilano Median, RN, ADN    Virtual Visit No    Medication changes reported     Yes   lasix added   Fall or balance concerns reported    No    Warm-up and Cool-down Performed on first and last piece of equipment    Resistance Training Performed Yes    VAD Patient? No    PAD/SET Patient? No      Pain Assessment   Currently in Pain? No/denies                Social History   Tobacco Use  Smoking Status Never  Smokeless Tobacco Never    Goals Met:  Independence with exercise equipment Exercise tolerated well No report of concerns or symptoms today  Goals Unmet:  Not Applicable  Comments: Pt able to follow exercise prescription today without complaint.  Will continue to monitor for progression.    Dr. Bethann Punches is Medical Director for [facility] Cardiac [facility].  Dr. Vida Rigger is Medical Director for [facility] Pulmonary Rehabilitation.

## 2022-11-03 ENCOUNTER — Encounter: Payer: Medicare Other | Admitting: *Deleted

## 2022-11-03 DIAGNOSIS — Z48812 Encounter for surgical aftercare following surgery on the circulatory system: Secondary | ICD-10-CM | POA: Diagnosis not present

## 2022-11-03 DIAGNOSIS — Z951 Presence of aortocoronary bypass graft: Secondary | ICD-10-CM

## 2022-11-03 NOTE — Progress Notes (Signed)
Daily Session Note  Patient Details  Name: Cassandra Joyce MRN: [id number] Date of Birth: [birth year] Referring Provider:   Flowsheet Row Cardiac Rehab from 09/26/2022 in [facility] Cardiac and Pulmonary Rehab  Referring Provider Dr. Dorothyann Peng       Encounter Date: 11/03/2022  Check In:  Session Check In - 11/03/22 1539       Check-In   Supervising physician immediately available to respond to emergencies See telemetry face sheet for immediately available ER MD    Location [facility]-Cardiac & Pulmonary Rehab    Staff Present Cora Collum, RN, BSN, CCRP;Joseph [place], RCP,RRT,BSRT;Meredith [place], RN Atilano Median, RN, ADN    Virtual Visit No    Medication changes reported     No    Fall or balance concerns reported    No    Warm-up and Cool-down Performed on first and last piece of equipment    Resistance Training Performed Yes    VAD Patient? No    PAD/SET Patient? No      Pain Assessment   Currently in Pain? No/denies                Social History   Tobacco Use  Smoking Status Never  Smokeless Tobacco Never    Goals Met:  Independence with exercise equipment Exercise tolerated well No report of concerns or symptoms today Strength training completed today  Goals Unmet:  Not Applicable  Comments: Pt able to follow exercise prescription today without complaint.  Will continue to monitor for progression.    Dr. Bethann Punches is Medical Director for [facility] Cardiac Rehabilitation.  Dr. Vida Rigger is Medical Director for [facility] Pulmonary Rehabilitation.

## 2022-11-07 ENCOUNTER — Encounter: Payer: Medicare Other | Attending: Internal Medicine | Admitting: *Deleted

## 2022-11-07 DIAGNOSIS — Z951 Presence of aortocoronary bypass graft: Secondary | ICD-10-CM | POA: Insufficient documentation

## 2022-11-07 NOTE — Progress Notes (Signed)
Daily Session Note  Patient Details  Name: Cassandra Joyce MRN: [id number] Date of Birth: March 28, 1945 Referring Provider:   Flowsheet Row Cardiac Rehab from 09/26/2022 in [facility] Cardiac and Pulmonary Rehab  Referring Provider Dr. Dorothyann Peng       Encounter Date: 11/07/2022  Check In:  Session Check In - 11/07/22 1536       Check-In   Supervising physician immediately available to respond to emergencies See telemetry face sheet for immediately available ER MD    Location [facility]-Cardiac & Pulmonary Rehab    Staff Present Maxon Suzzette Righter, , Exercise Physiologist;Contrell [place] Jewel Baize, RN BSN;Joseph Reino [place], RCP,RRT,BSRT    Virtual Visit No    Medication changes reported     No    Fall or balance concerns reported    No    Warm-up and Cool-down Performed on first and last piece of equipment    Resistance Training Performed Yes    VAD Patient? No    PAD/SET Patient? No      Pain Assessment   Currently in Pain? No/denies                Social History   Tobacco Use  Smoking Status Never  Smokeless Tobacco Never    Goals Met:  Independence with exercise equipment Exercise tolerated well No report of concerns or symptoms today Strength training completed today  Goals Unmet:  Not Applicable  Comments: Pt able to follow exercise prescription today without complaint.  Will continue to monitor for progression.    Dr. Bethann Punches is Medical Director for [facility] Cardiac Rehabilitation.  Dr. Vida Rigger is Medical Director for [facility] Pulmonary Rehabilitation.

## 2022-11-09 ENCOUNTER — Encounter: Payer: Medicare Other | Admitting: *Deleted

## 2022-11-09 DIAGNOSIS — Z951 Presence of aortocoronary bypass graft: Secondary | ICD-10-CM | POA: Diagnosis not present

## 2022-11-09 NOTE — Progress Notes (Signed)
Daily Session Note  Patient Details  Name: Cassandra Joyce MRN: [id number] Date of Birth: [birth year] Referring Provider:   Flowsheet Row Cardiac Rehab from 09/26/2022 in [facility] Cardiac and Pulmonary Rehab  Referring Provider Dr. Dorothyann Peng       Encounter Date: 11/09/2022  Check In:  Session Check In - 11/09/22 1533       Check-In   Supervising physician immediately available to respond to emergencies See telemetry face sheet for immediately available ER MD    Location [facility]-Cardiac & Pulmonary Rehab    Staff Present Tommye Standard, BS, ACSM CEP, Exercise Physiologist;Megan Katrinka Blazing, RN, ADN;Marlie Kuennen Jewel Baize, RN BSN;Joseph Reino Kent, RCP,RRT,BSRT    Virtual Visit No    Medication changes reported     No    Fall or balance concerns reported    No    Warm-up and Cool-down Performed on first and last piece of equipment    Resistance Training Performed Yes    VAD Patient? No    PAD/SET Patient? No      Pain Assessment   Currently in Pain? No/denies                Social History   Tobacco Use  Smoking Status Never  Smokeless Tobacco Never    Goals Met:  Independence with exercise equipment Exercise tolerated well No report of concerns or symptoms today Strength training completed today  Goals Unmet:  Not Applicable  Comments: Pt able to follow exercise prescription today without complaint.  Will continue to monitor for progression.    Dr. Bethann Punches is Medical Director for [facility] Cardiac Rehabilitation.  Dr. Vida Rigger is Medical Director for [facility] Pulmonary Rehabilitation.

## 2022-11-10 ENCOUNTER — Encounter: Payer: Medicare Other | Admitting: *Deleted

## 2022-11-10 DIAGNOSIS — Z951 Presence of aortocoronary bypass graft: Secondary | ICD-10-CM | POA: Diagnosis not present

## 2022-11-10 NOTE — Progress Notes (Signed)
Daily Session Note  Patient Details  Name: Cassandra Joyce MRN: [id number] Date of Birth: 1945/03/24 Referring Provider:   Flowsheet Row Cardiac Rehab from 09/26/2022 in [facility] Cardiac and Pulmonary Rehab  Referring Provider Dr. Dorothyann Peng       Encounter Date: 11/10/2022  Check In:  Session Check In - 11/10/22 1525       Check-In   Supervising physician immediately available to respond to emergencies See telemetry face sheet for immediately available ER MD    Location [facility]-Cardiac & Pulmonary Rehab    Staff Present Susann Givens, RN BSN;Joseph [place], RCP,RRT,BSRT;Noah Tickle, [place], Exercise Physiologist;Megan Katrinka Blazing, RN, [place]    Virtual Visit No    Medication changes reported     No    Fall or balance concerns reported    No    Warm-up and Cool-down Performed on first and last piece of equipment    Resistance Training Performed Yes    VAD Patient? No    PAD/SET Patient? No      Pain Assessment   Currently in Pain? No/denies                Social History   Tobacco Use  Smoking Status Never  Smokeless Tobacco Never    Goals Met:  Independence with exercise equipment Exercise tolerated well No report of concerns or symptoms today Strength training completed today  Goals Unmet:  Not Applicable  Comments: Pt able to follow exercise prescription today without complaint.  Will continue to monitor for progression.    Dr. Bethann Punches is Medical Director for [facility] Cardiac Rehabilitation.  Dr. Vida Rigger is Medical Director for [facility] Pulmonary Rehabilitation.

## 2022-11-14 ENCOUNTER — Encounter: Payer: Medicare Other | Admitting: *Deleted

## 2022-11-14 DIAGNOSIS — Z951 Presence of aortocoronary bypass graft: Secondary | ICD-10-CM

## 2022-11-14 NOTE — Progress Notes (Signed)
Daily Session Note  Patient Details  Name: Cassandra Joyce MRN: [id number] Date of Birth: 1945-01-18 Referring Provider:   Flowsheet Row Cardiac Rehab from 09/26/2022 in [facility] Cardiac and Pulmonary Rehab  Referring Provider Dr. Dorothyann Peng       Encounter Date: 11/14/2022  Check In:  Session Check In - 11/14/22 1542       Check-In   Supervising physician immediately available to respond to emergencies See telemetry face sheet for immediately available ER MD    Location [facility]-Cardiac & Pulmonary Rehab    Staff Present Maxon [place] BS, , Exercise Physiologist;Megan Katrinka Blazing, RN, ADN;Hadja Harral Jewel Baize, RN BSN;Joseph [place], RCP,RRT,BSRT    Virtual Visit No    Medication changes reported     No    Fall or balance concerns reported    No    Warm-up and Cool-down Performed on first and last piece of equipment    Resistance Training Performed Yes    VAD Patient? No    PAD/SET Patient? No      Pain Assessment   Currently in Pain? No/denies                Social History   Tobacco Use  Smoking Status Never  Smokeless Tobacco Never    Goals Met:  Independence with exercise equipment Exercise tolerated well No report of concerns or symptoms today Strength training completed today  Goals Unmet:  Not Applicable  Comments: Pt able to follow exercise prescription today without complaint.  Will continue to monitor for progression.    Dr. Bethann Punches is Medical Director for [facility] Cardiac Rehabilitation.  Dr. Vida Rigger is Medical Director for [facility] Pulmonary Rehabilitation.

## 2022-11-16 ENCOUNTER — Encounter: Payer: Medicare Other | Admitting: *Deleted

## 2022-11-16 DIAGNOSIS — Z951 Presence of aortocoronary bypass graft: Secondary | ICD-10-CM

## 2022-11-16 NOTE — Progress Notes (Signed)
Daily Session Note  Patient Details  Name: Cassandra Joyce MRN: [id number] Date of Birth: 1945-04-24 Referring Provider:   Flowsheet Row Cardiac Rehab from 09/26/2022 in [facility] Cardiac and Pulmonary Rehab  Referring Provider Dr. Dorothyann Peng       Encounter Date: 11/16/2022  Check In:  Session Check In - 11/16/22 1551       Check-In   Supervising physician immediately available to respond to emergencies See telemetry face sheet for immediately available ER MD    Location [facility]-Cardiac & Pulmonary Rehab    Staff Present Susann Givens, RN BSN;Joseph [place], RCP,RRT,BSRT;Kelly [place], BS, ACSM CEP, Exercise Physiologist;Megan Katrinka Blazing, RN, ADN    Virtual Visit No    Medication changes reported     No    Fall or balance concerns reported    No    Warm-up and Cool-down Performed on first and last piece of equipment    Resistance Training Performed Yes    VAD Patient? No    PAD/SET Patient? No      Pain Assessment   Currently in Pain? No/denies                Social History   Tobacco Use  Smoking Status Never  Smokeless Tobacco Never    Goals Met:  Independence with exercise equipment Exercise tolerated well No report of concerns or symptoms today Strength training completed today  Goals Unmet:  Not Applicable  Comments: Pt able to follow exercise prescription today without complaint.  Will continue to monitor for progression.    Dr. Bethann Punches is Medical Director for [facility] Cardiac Rehabilitation.  Dr. Vida Rigger is Medical Director for [facility] Pulmonary Rehabilitation.

## 2022-11-17 ENCOUNTER — Encounter: Payer: Medicare Other | Admitting: *Deleted

## 2022-11-17 DIAGNOSIS — Z951 Presence of aortocoronary bypass graft: Secondary | ICD-10-CM

## 2022-11-17 NOTE — Progress Notes (Signed)
Daily Session Note  Patient Details  Name: Cassandra Joyce MRN: [id number] Date of Birth: 16-Nov-1945 Referring Provider:   Flowsheet Row Cardiac Rehab from 09/26/2022 in [facility] Cardiac and Pulmonary Rehab  Referring Provider Dr. Dorothyann Peng       Encounter Date: 11/17/2022  Check In:  Session Check In - 11/17/22 1533       Check-In   Supervising physician immediately available to respond to emergencies See telemetry face sheet for immediately available ER MD    Location [facility]-Cardiac & Pulmonary Rehab    Staff Present Susann Givens, RN BSN;Joseph [place], RCP,RRT,BSRT;Maxon [place] BS, , Exercise Physiologist;Megan Katrinka Blazing, RN, [place]    Virtual Visit No    Medication changes reported     No    Fall or balance concerns reported    No    Warm-up and Cool-down Performed on first and last piece of equipment    Resistance Training Performed Yes    VAD Patient? No    PAD/SET Patient? No      Pain Assessment   Currently in Pain? No/denies                Social History   Tobacco Use  Smoking Status Never  Smokeless Tobacco Never    Goals Met:  Independence with exercise equipment Exercise tolerated well No report of concerns or symptoms today Strength training completed today  Goals Unmet:  Not Applicable  Comments: Pt able to follow exercise prescription today without complaint.  Will continue to monitor for progression.    Dr. Bethann Punches is Medical Director for [facility] Cardiac Rehabilitation.  Dr. Vida Rigger is Medical Director for [facility] Pulmonary Rehabilitation.

## 2022-11-21 ENCOUNTER — Encounter: Payer: Medicare Other | Admitting: *Deleted

## 2022-11-21 DIAGNOSIS — Z951 Presence of aortocoronary bypass graft: Secondary | ICD-10-CM | POA: Diagnosis not present

## 2022-11-21 NOTE — Progress Notes (Signed)
Daily Session Note  Patient Details  Name: Cassandra Joyce MRN: [id number] Date of Birth: 1945-10-13 Referring Provider:   Flowsheet Row Cardiac Rehab from 09/26/2022 in [facility] Cardiac and Pulmonary Rehab  Referring Provider Dr. Dorothyann Peng       Encounter Date: 11/21/2022  Check In:  Session Check In - 11/21/22 1533       Check-In   Supervising physician immediately available to respond to emergencies See telemetry face sheet for immediately available ER MD    Location [facility]-Cardiac & Pulmonary Rehab    Staff Present Susann Givens, RN BSN;Joseph [place], RCP,RRT,BSRT;Krista [place] RN, Atilano Median, RN, [place]    Virtual Visit No    Medication changes reported     No    Fall or balance concerns reported    No    Warm-up and Cool-down Performed on first and last piece of equipment    Resistance Training Performed Yes    VAD Patient? No    PAD/SET Patient? No      Pain Assessment   Currently in Pain? No/denies                Social History   Tobacco Use  Smoking Status Never  Smokeless Tobacco Never    Goals Met:  Independence with exercise equipment Exercise tolerated well No report of concerns or symptoms today Strength training completed today  Goals Unmet:  Not Applicable  Comments: Pt able to follow exercise prescription today without complaint.  Will continue to monitor for progression.    Dr. Bethann Punches is Medical Director for [facility] Cardiac Rehabilitation.  Dr. Vida Rigger is Medical Director for [facility] Pulmonary Rehabilitation.

## 2022-11-21 NOTE — Patient Instructions (Signed)
Discharge Patient Instructions  Patient Details  Name: Cassandra Joyce MRN: [id number] Date of Birth: 02-18-1945 Referring Provider:  Mick Sell, MD   Number of Visits: 36  Reason for Discharge:  Patient reached a stable level of exercise. Patient independent in their exercise. Patient has met program and personal goals.   Diagnosis:  S/P CABG x 3  Initial Exercise Prescription:  Initial Exercise Prescription - 09/26/22 1500       Date of Initial Exercise RX and Referring Provider   Date 09/26/22    Referring Provider Dr. Dorothyann Peng      Oxygen   Maintain Oxygen Saturation 88% or higher      Treadmill   MPH 2    Grade 1    Minutes 15    METs 2.86      NuStep   Level 2   T6   SPM 80    Minutes 15    METs 2.81      REL-XR   Level 2    Speed 50    Minutes 15    METs 2.81      Biostep-RELP   Level 2    SPM 50    Minutes 15    METs 2.81      Track   Laps 30    Minutes 15    METs 2.63      Prescription Details   Frequency (times per week) 3    Duration Progress to 30 minutes of continuous aerobic without signs/symptoms of physical distress      Intensity   THRR 40-80% of Max Heartrate 99-127    Ratings of Perceived Exertion 11-13    Perceived Dyspnea 0-4      Progression   Progression Continue to progress workloads to maintain intensity without signs/symptoms of physical distress.      Resistance Training   Training Prescription Yes    Weight 1    Reps 10-15             Discharge Exercise Prescription (Final Exercise Prescription Changes):  Exercise Prescription Changes - 11/09/22 1500       Response to Exercise   Blood Pressure (Admit) 100/60    Blood Pressure (Exit) 102/58    Heart Rate (Admit) 66 bpm    Heart Rate (Exercise) 87 bpm    Heart Rate (Exit) 73 bpm    Rating of Perceived Exertion (Exercise) 13    Perceived Dyspnea (Exercise) 0    Symptoms none    Duration Progress to 30 minutes of  aerobic without  signs/symptoms of physical distress    Intensity THRR unchanged      Progression   Progression Continue to progress workloads to maintain intensity without signs/symptoms of physical distress.    Average METs 2.3      Resistance Training   Training Prescription Yes    Weight 4 lb    Reps 10-15      Interval Training   Interval Training No      Treadmill   MPH 2.4    Grade 0    Minutes 15    METs 2.84      REL-XR   Level 2    Minutes 15    METs 3.1      T5 Nustep   Level 2   T6   Minutes 15    METs 1.8      Biostep-RELP   Level 2    Minutes 15  METs 2      Oxygen   Maintain Oxygen Saturation 88% or higher             Functional Capacity:  6 Minute Walk     Row Name 09/26/22 1528 11/21/22 1552       6 Minute Walk   Phase Initial Discharge    Distance 1300 feet 1365 feet    Distance % Change -- 5 %    Distance Feet Change -- 65 ft    Walk Time 6 minutes 6 minutes    # of Rest Breaks 0 0    MPH 2.46 2.59    METS 2.81 2.48    RPE 12 13    Perceived Dyspnea  1 0    VO2 Peak 9.85 8.68    Symptoms No Yes (comment)    Comments -- back pain 8/10    Resting HR 72 bpm 62 bpm    Resting BP 110/62 100/58    Resting Oxygen Saturation  99 % 98 %    Exercise Oxygen Saturation  during 6 min walk 99 % 99 %    Max Ex. HR 97 bpm 73 bpm    Max Ex. BP 138/70 130/60    2 Minute Post BP 118/70 124/60               Nutrition & Weight - Outcomes:  Pre Biometrics - 09/26/22 1547       Pre Biometrics   Height 5\' 6"  (1.676 m)    Weight 125 lb 14.4 oz (57.1 kg)    Waist Circumference 31 inches    Hip Circumference 39.5 inches    Waist to Hip Ratio 0.78 %    BMI (Calculated) 20.33    Single Leg Stand 5.62 seconds              Nutrition:  Nutrition Therapy & Goals - 09/26/22 1640       Nutrition Therapy   Diet Carb controlled, cardiac    Protein (specify units) 75    Fiber 25 grams    Whole Grain Foods 3 servings    Saturated Fats 15 max.  grams    Fruits and Vegetables 5 servings/day    Sodium 2 grams      Personal Nutrition Goals   Nutrition Goal Eat 3-5 small meal/snacks per day    Personal Goal #2 Measure carbs per meal, dose insulin and pair carbs with protein or healthy fats    Personal Goal #3 Try greek yogurt or protein shake    Comments Patient reports her appetite has gone down a lot over the past ~3-40months. She says that she often only eats half her plate, says she doses her insulin pump before eating. Recently she has dosed her insulin for the entire meal then only been able to eat half. Spoke to her about set out portions she can eat, even if small. Educated on the smaller portions of nutrient dense foods and how she could meet goals even with her lesser appetite. Tuna, cheese, peanut butter, beans, lentils, protein shake, chicken, avocado, ect as some examples of foods with lots of nutrition in smaller quantities. Reviewed heart healthy guidelines, but main goals are on feeding her body with her lesser appetite, choosing calorie dense foods, aiming for smaller more frequent meals and making sure blood sugars stay stable, not dropping due to too much insulin and not enough carbs. Educated on simple sugars and complex carbs, encouraged pairing carbs with protein  or healthy fat. Built out several meals and snacks with foods she likes and will eat, focusing small portions, calorie dense foods, adequate protein and blood sugar control.      Intervention Plan   Intervention Prescribe, educate and counsel regarding individualized specific dietary modifications aiming towards targeted core components such as weight, hypertension, lipid management, diabetes, heart failure and other comorbidities.;Nutrition handout(s) given to patient.    Expected Outcomes Short Term Goal: Understand basic principles of dietary content, such as calories, fat, sodium, cholesterol and nutrients.;Short Term Goal: A plan has been developed with personal  nutrition goals set during dietitian appointment.;Long Term Goal: Adherence to prescribed nutrition plan.

## 2022-11-23 ENCOUNTER — Encounter: Payer: Medicare Other | Admitting: *Deleted

## 2022-11-23 ENCOUNTER — Encounter: Payer: Self-pay | Admitting: *Deleted

## 2022-11-23 DIAGNOSIS — Z951 Presence of aortocoronary bypass graft: Secondary | ICD-10-CM

## 2022-11-23 NOTE — Progress Notes (Signed)
Cardiac Individual Treatment Plan  Patient Details  Name: Cassandra Joyce MRN: [id number] Date of Birth: September 19, 1945 Referring Provider:   Flowsheet Row Cardiac Rehab from 09/26/2022 in [facility] Cardiac and Pulmonary Rehab  Referring Provider Dr. Dorothyann Peng       Initial Encounter Date:  Flowsheet Row Cardiac Rehab from 09/26/2022 in [facility] Cardiac and Pulmonary Rehab  Date 09/26/22       Visit Diagnosis: S/P CABG x 3  Patient's Home Medications on Admission:  Current Outpatient Medications:    acetaminophen (TYLENOL) 500 MG tablet, Take 2 tablets (1,000 mg total) by mouth every 8 (eight) hours. (Patient not taking: Reported on 08/12/2022), Disp: 30 tablet, Rfl: 0   Alpha-Lipoic Acid 200 MG TABS, Take by mouth daily. (Patient not taking: Reported on 09/15/2022), Disp: , Rfl:    aspirin (ASPIRIN 81) 81 MG chewable tablet, , Disp: , Rfl:    ASPIRIN 81 PO, Take by mouth daily. (Patient not taking: Reported on 09/15/2022), Disp: , Rfl:    Azelastine HCl 137 MCG/SPRAY SOLN, SMARTSIG:1-2 Spray(s) Both Nares Every 12 Hours PRN (Patient not taking: Reported on 09/15/2022), Disp: , Rfl:    B Complex Vitamins (VITAMIN B COMPLEX) TABS, Take 1 tablet by mouth daily. (Patient not taking: Reported on 09/20/2022), Disp: , Rfl:    Calcium Carb-Cholecalciferol (CALCIUM HIGH POTENCY/VITAMIN D) 600-5 MG-MCG TABS, Take by mouth., Disp: , Rfl:    calcium carbonate (OSCAL) 1500 (600 Ca) MG TABS tablet, Take by mouth 2 (two) times daily with a meal. (Patient not taking: Reported on 09/15/2022), Disp: , Rfl:    Cholecalciferol 1.25 MG (50000 UT) TABS, , Disp: , Rfl:    clindamycin (CLEOCIN) 150 MG capsule, TAKE 4 CAPS 1HR PRIOR TO DENTAL APPT WITH FOOD (Patient not taking: Reported on 09/15/2022), Disp: , Rfl:    Continuous Glucose Sensor (DEXCOM G6 SENSOR) MISC, Use 1 each every 10 (ten) days (Patient not taking: Reported on 09/15/2022), Disp: , Rfl:    Continuous Glucose Sensor (DEXCOM G7 SENSOR) MISC, Use 1 Device  every 10 (ten) days (Patient not taking: Reported on 09/15/2022), Disp: , Rfl:    Continuous Glucose Transmitter (DEXCOM G6 TRANSMITTER) MISC, See admin instructions., Disp: , Rfl:    cyanocobalamin 1000 MCG tablet, Take 1,000 mcg by mouth daily. (Patient not taking: Reported on 09/15/2022), Disp: , Rfl:    enalapril (VASOTEC) 2.5 MG tablet, Take 2.5 mg by mouth. (Patient not taking: Reported on 09/15/2022), Disp: , Rfl:    Enoxaparin Sodium (LOVENOX IJ), Lovenox (Patient not taking: Reported on 09/15/2022), Disp: , Rfl:    Enoxaparin Sodium (LOVENOX IJ), , Disp: , Rfl:    fluocinonide (LIDEX) 0.05 % external solution, Apply to aa of scalp qd/bid prn for itch bumps (Patient not taking: Reported on 09/15/2022), Disp: 60 mL, Rfl: 3   fluticasone (FLONASE) 50 MCG/ACT nasal spray, Place 2 sprays into both nostrils daily. (Patient not taking: Reported on 09/15/2022), Disp: , Rfl:    furosemide (LASIX) 20 MG tablet, PLEASE SEE ATTACHED FOR DETAILED DIRECTIONS (Patient not taking: Reported on 09/15/2022), Disp: , Rfl:    hydrochlorothiazide (HYDRODIURIL) 12.5 MG tablet, Take 12.5 mg by mouth daily. (Patient not taking: Reported on 09/15/2022), Disp: , Rfl:    HYDROcodone-acetaminophen (NORCO/VICODIN) 5-325 MG tablet, 1 PO Q 6hrs prn pain (Patient not taking: Reported on 09/15/2022), Disp: , Rfl:    influenza vaccine adjuvanted (FLUAD) 0.5 ML injection, Fluad 2016-17 24yr up(PF)45 mcg(15 mcgx3)/0.5 mL intramuscular syringe  ADM 0.5ML IM UTD (Patient not  taking: Reported on 09/15/2022), Disp: , Rfl:    influenza vaccine adjuvanted (FLUAD) 0.5 ML injection, ADM 0.5ML IM UTD (Patient not taking: Reported on 09/15/2022), Disp: , Rfl:    insulin lispro (HUMALOG) 100 UNIT/ML injection, Inject 30 Units into the skin once. Pt uses insulin pump-uses 100 units every 3 days, Disp: , Rfl:    isosorbide mononitrate (IMDUR) 30 MG 24 hr tablet, Take 30 mg by mouth daily., Disp: , Rfl:    KLOR-CON M10 10 MEQ tablet, PLEASE SEE ATTACHED  FOR DETAILED DIRECTIONS (Patient not taking: Reported on 09/15/2022), Disp: , Rfl:    KLOR-CON M20 20 MEQ tablet, Take by mouth. (Patient not taking: Reported on 09/15/2022), Disp: , Rfl:    metoCLOPramide (REGLAN) 10 MG tablet, Take 10 mg by mouth 3 (three) times daily., Disp: , Rfl:    metolazone (ZAROXOLYN) 2.5 MG tablet, Take by mouth. (Patient not taking: Reported on 09/15/2022), Disp: , Rfl:    metoprolol succinate (TOPROL-XL) 25 MG 24 hr tablet, Take 12.5 mg by mouth at bedtime., Disp: , Rfl:    metoprolol tartrate (LOPRESSOR) 25 MG tablet, Take 25 mg by mouth 2 (two) times daily., Disp: , Rfl:    mometasone (NASONEX) 50 MCG/ACT nasal spray, Place 2 sprays into the nose daily. (Patient not taking: Reported on 09/15/2022), Disp: , Rfl:    nitroGLYCERIN (NITROSTAT) 0.4 MG SL tablet, PLEASE SEE ATTACHED FOR DETAILED DIRECTIONS, Disp: , Rfl:    omeprazole (PRILOSEC) 40 MG capsule, Take 40 mg by mouth daily., Disp: , Rfl:    ondansetron (ZOFRAN) 4 MG tablet, Take 2 tablets twice a day by oral route. (Patient not taking: Reported on 09/15/2022), Disp: , Rfl:    ondansetron (ZOFRAN-ODT) 4 MG disintegrating tablet, Take 4 mg by mouth every 8 (eight) hours as needed. (Patient not taking: Reported on 09/15/2022), Disp: , Rfl:    oxyCODONE (OXY IR/ROXICODONE) 5 MG immediate release tablet, Take 5 mg by mouth every 6 (six) hours as needed. (Patient not taking: Reported on 09/15/2022), Disp: , Rfl:    polyethylene glycol (MIRALAX / GLYCOLAX) 17 g packet, Take by mouth., Disp: , Rfl:    Potassium Chloride ER 20 MEQ TBCR, Take by mouth., Disp: , Rfl:    rosuvastatin (CRESTOR) 40 MG tablet, Take by mouth., Disp: , Rfl:    senna-docusate (SENOKOT-S) 8.6-50 MG tablet, Take by mouth., Disp: , Rfl:    Sennosides (SENNA) 8.6 MG CAPS, senna (Patient not taking: Reported on 09/15/2022), Disp: , Rfl:    Sennosides (SENNA-TIME PO), senna (Patient not taking: Reported on 09/15/2022), Disp: , Rfl:    Sennosides (SENNA-TIME  PO), senna (Patient not taking: Reported on 09/15/2022), Disp: , Rfl:    Sennosides (SENNA-TIME PO), , Disp: , Rfl:    simvastatin (ZOCOR) 40 MG tablet, Take 40 mg by mouth daily. (Patient not taking: Reported on 09/15/2022), Disp: , Rfl:    triamcinolone ointment (KENALOG) 0.1 %, Apply topically 2 (two) times daily. (Patient not taking: Reported on 09/15/2022), Disp: , Rfl:   Past Medical History: Past Medical History:  Diagnosis Date   Actinic keratosis 09/28/2020   upper sternum, bx   Barrett's esophagus    Basal cell carcinoma 11/13/2007   Left nasal root. Excised 12/07/2007, margins free.   Cancer (HCC)    skin   Diabetes mellitus type 1 (HCC)    Medtronic Medimed Insulin pump   Dysplastic nevus 04/07/2009   RLQA. Mild to moderate atypia, edges free.   Essential (primary) hypertension  Female dyspareunia    GERD (gastroesophageal reflux disease)    Barret's esphagus   Hematuria    Hyperlipidemia    Hypertension    Impaired hearing    left ear hearing loss, tinnitis   Menopause    Microscopic colitis    Nasal polyps    Osteoarthritis    right hip, back   Seasonal allergies    Sinusitis    Vertigo    none recently   Wears hearing aid in both ears     Tobacco Use: Social History   Tobacco Use  Smoking Status Never  Smokeless Tobacco Never    Labs: Review Flowsheet       Latest Ref Rng & Units 03/26/2015 08/03/2015 08/12/2022  Labs for ITP Cardiac and Pulmonary Rehab  Hemoglobin A1c 4.8 - 5.6 % 6.8  7.2  -  PH, Arterial 7.35 - 7.45 - - 7.392   PCO2 arterial 32 - 48 mmHg - - 38.1   Bicarbonate 20.0 - 28.0 mmol/L - - 23.2  23.5   TCO2 22 - 32 mmol/L - - 24  25   Acid-base deficit 0.0 - 2.0 mmol/L - - 2.0  3.0   O2 Saturation % - - 92  63     Details       Multiple values from one day are sorted in reverse-chronological order          Exercise Target Goals: Exercise Program Goal: Individual exercise prescription set using results from initial 6 min  walk test and THRR while considering  patient's activity barriers and safety.   Exercise Prescription Goal: Initial exercise prescription builds to 30-45 minutes a day of aerobic activity, 2-3 days per week.  Home exercise guidelines will be given to patient during program as part of exercise prescription that the participant will acknowledge.   Education: Aerobic Exercise: - Group verbal and visual presentation on the components of exercise prescription. Introduces F.I.T.T principle from ACSM for exercise prescriptions.  Reviews F.I.T.T. principles of aerobic exercise including progression. Written material given at graduation.   Education: Resistance Exercise: - Group verbal and visual presentation on the components of exercise prescription. Introduces F.I.T.T principle from ACSM for exercise prescriptions  Reviews F.I.T.T. principles of resistance exercise including progression. Written material given at graduation. Flowsheet Row Cardiac Rehab from 11/16/2022 in [facility] Cardiac and Pulmonary Rehab  Date 10/26/22  Educator [facility]  Instruction Review Code 1- [employer] Understanding        Education: Exercise & Equipment Safety: - Individual verbal instruction and demonstration of equipment use and safety with use of the equipment. Flowsheet Row Cardiac Rehab from 11/16/2022 in [facility] Cardiac and Pulmonary Rehab  Date 09/26/22  Educator [facility]  Instruction Review Code 1- Verbalizes Understanding       Education: Exercise Physiology & General Exercise Guidelines: - Group verbal and written instruction with models to review the exercise physiology of the cardiovascular system and associated critical values. Provides general exercise guidelines with specific guidelines to those with heart or lung disease.  Flowsheet Row Cardiac Rehab from 11/16/2022 in [facility] Cardiac and Pulmonary Rehab  Date 10/19/22  Educator [facility]  Instruction Review Code 1- [employer] Understanding       Education:  Flexibility, Balance, Mind/Body Relaxation: - Group verbal and visual presentation with interactive activity on the components of exercise prescription. Introduces F.I.T.T principle from ACSM for exercise prescriptions. Reviews F.I.T.T. principles of flexibility and balance exercise training including progression. Also discusses the mind body connection.  Reviews various relaxation  techniques to help reduce and manage stress (i.e. Deep breathing, progressive muscle relaxation, and visualization). Balance handout provided to take home. Written material given at graduation. Flowsheet Row Cardiac Rehab from 11/16/2022 in [facility] Cardiac and Pulmonary Rehab  Date 10/26/22  Educator [facility]  Instruction Review Code 1- Verbalizes Understanding       Activity Barriers & Risk Stratification:  Activity Barriers & Cardiac Risk Stratification - 09/26/22 1529       Activity Barriers & Cardiac Risk Stratification   Activity Barriers Other (comment)    Comments still under lifting restrictions post op and history of right hip fracture and repair (currently no pain just limited ROM)    Cardiac Risk Stratification High             6 Minute Walk:  6 Minute Walk     Row Name 09/26/22 1528 11/21/22 1552       6 Minute Walk   Phase Initial Discharge    Distance 1300 feet 1365 feet    Distance % Change -- 5 %    Distance Feet Change -- 65 ft    Walk Time 6 minutes 6 minutes    # of Rest Breaks 0 0    MPH 2.46 2.59    METS 2.81 2.48    RPE 12 13    Perceived Dyspnea  1 0    VO2 Peak 9.85 8.68    Symptoms No Yes (comment)    Comments -- back pain 8/10    Resting HR 72 bpm 62 bpm    Resting BP 110/62 100/58    Resting Oxygen Saturation  99 % 98 %    Exercise Oxygen Saturation  during 6 min walk 99 % 99 %    Max Ex. HR 97 bpm 73 bpm    Max Ex. BP 138/70 130/60    2 Minute Post BP 118/70 124/60             Oxygen Initial Assessment:   Oxygen Re-Evaluation:   Oxygen Discharge (Final  Oxygen Re-Evaluation):   Initial Exercise Prescription:  Initial Exercise Prescription - 09/26/22 1500       Date of Initial Exercise RX and Referring Provider   Date 09/26/22    Referring Provider Dr. Dorothyann Peng      Oxygen   Maintain Oxygen Saturation 88% or higher      Treadmill   MPH 2    Grade 1    Minutes 15    METs 2.86      NuStep   Level 2   T6   SPM 80    Minutes 15    METs 2.81      REL-XR   Level 2    Speed 50    Minutes 15    METs 2.81      Biostep-RELP   Level 2    SPM 50    Minutes 15    METs 2.81      Track   Laps 30    Minutes 15    METs 2.63      Prescription Details   Frequency (times per week) 3    Duration Progress to 30 minutes of continuous aerobic without signs/symptoms of physical distress      Intensity   THRR 40-80% of Max Heartrate 99-127    Ratings of Perceived Exertion 11-13    Perceived Dyspnea 0-4      Progression   Progression Continue to progress workloads to maintain intensity  without signs/symptoms of physical distress.      Resistance Training   Training Prescription Yes    Weight 1    Reps 10-15             Perform Capillary Blood Glucose checks as needed.  Exercise Prescription Changes:   Exercise Prescription Changes     Row Name 09/26/22 1500 10/12/22 1000 10/27/22 0700 11/09/22 1500       Response to Exercise   Blood Pressure (Admit) 110/62 130/60 122/60 100/60    Blood Pressure (Exercise) 138/70 134/60 128/54 --    Blood Pressure (Exit) 118/70 122/60 98/58 102/58    Heart Rate (Admit) 72 bpm 74 bpm 67 bpm 66 bpm    Heart Rate (Exercise) 97 bpm 96 bpm 94 bpm 87 bpm    Heart Rate (Exit) 77 bpm 80 bpm 72 bpm 73 bpm    Oxygen Saturation (Admit) 99 % -- -- --    Oxygen Saturation (Exercise) 99 % -- -- --    Oxygen Saturation (Exit) 98 % -- -- --    Rating of Perceived Exertion (Exercise) 12 15 13 13     Perceived Dyspnea (Exercise) 1 0 -- 0    Symptoms none none none none    Comments 6  MWT results First 2 weeks of exercise -- --    Duration -- Progress to 30 minutes of  aerobic without signs/symptoms of physical distress Progress to 30 minutes of  aerobic without signs/symptoms of physical distress Progress to 30 minutes of  aerobic without signs/symptoms of physical distress    Intensity -- THRR unchanged THRR unchanged THRR unchanged      Progression   Progression -- Continue to progress workloads to maintain intensity without signs/symptoms of physical distress. Continue to progress workloads to maintain intensity without signs/symptoms of physical distress. Continue to progress workloads to maintain intensity without signs/symptoms of physical distress.    Average METs -- 2.07 2.4 2.3      Resistance Training   Training Prescription -- Yes Yes Yes    Weight -- 1 1 lb 4 lb    Reps -- 10-15 10-15 10-15      Interval Training   Interval Training -- No No No      Treadmill   MPH -- -- 1.9 2.4    Grade -- -- 0 0    Minutes -- -- 15 15    METs -- -- 2.45 2.84      Recumbant Bike   Level -- 1 -- --    Watts -- 25 -- --    Minutes -- 15 -- --    METs -- 3.34 -- --      NuStep   Level -- 2 4  T6 nustep --    Minutes -- 15 15 --    METs -- 1.5 2 --      REL-XR   Level -- -- 2 2    Minutes -- -- 15 15    METs -- -- 5.4 3.1      T5 Nustep   Level -- 1  T6 -- 2  T6    Minutes -- 15 -- 15    METs -- 1.7 -- 1.8      Biostep-RELP   Level -- 2 2 2     Minutes -- 15 15 15     METs -- 2 3 2       Track   Laps -- 22 -- --    Minutes -- 15 -- --  METs -- 2.2 -- --      Oxygen   Maintain Oxygen Saturation -- 88% or higher 88% or higher 88% or higher             Exercise Comments:   Exercise Comments     Row Name 09/29/22 1704           Exercise Comments First full day of exercise!  Patient was oriented to gym and equipment including functions, settings, policies, and procedures.  Patient's individual exercise prescription and treatment plan were  reviewed.  All starting workloads were established based on the results of the 6 minute walk test done at initial orientation visit.  The plan for exercise progression was also introduced and progression will be customized based on patient's performance and goals.                Exercise Goals and Review:   Exercise Goals     Row Name 09/26/22 1546             Exercise Goals   Increase Physical Activity Yes       Intervention Provide advice, education, support and counseling about physical activity/exercise needs.;Develop an individualized exercise prescription for aerobic and resistive training based on initial evaluation findings, risk stratification, comorbidities and participant's personal goals.       Expected Outcomes Short Term: Attend rehab on a regular basis to increase amount of physical activity.;Long Term: Add in home exercise to make exercise part of routine and to increase amount of physical activity.;Long Term: Exercising regularly at least 3-5 days a week.       Increase Strength and Stamina Yes       Intervention Provide advice, education, support and counseling about physical activity/exercise needs.;Develop an individualized exercise prescription for aerobic and resistive training based on initial evaluation findings, risk stratification, comorbidities and participant's personal goals.       Expected Outcomes Short Term: Increase workloads from initial exercise prescription for resistance, speed, and METs.;Short Term: Perform resistance training exercises routinely during rehab and add in resistance training at home;Long Term: Improve cardiorespiratory fitness, muscular endurance and strength as measured by increased METs and functional capacity ( )       Able to understand and use rate of perceived exertion (RPE) scale Yes       Intervention Provide education and explanation on how to use RPE scale       Expected Outcomes Short Term: Able to use RPE daily in rehab to  express subjective intensity level;Long Term:  Able to use RPE to guide intensity level when exercising independently       Able to understand and use Dyspnea scale Yes       Intervention Provide education and explanation on how to use Dyspnea scale       Expected Outcomes Short Term: Able to use Dyspnea scale daily in rehab to express subjective sense of shortness of breath during exertion;Long Term: Able to use Dyspnea scale to guide intensity level when exercising independently       Knowledge and understanding of Target Heart Rate Range (THRR) Yes       Intervention Provide education and explanation of THRR including how the numbers were predicted and where they are located for reference       Expected Outcomes Short Term: Able to state/look up THRR;Long Term: Able to use THRR to govern intensity when exercising independently;Short Term: Able to use daily as guideline for intensity in rehab  Able to check pulse independently Yes       Intervention Provide education and demonstration on how to check pulse in carotid and radial arteries.;Review the importance of being able to check your own pulse for safety during independent exercise       Expected Outcomes Short Term: Able to explain why pulse checking is important during independent exercise;Long Term: Able to check pulse independently and accurately       Understanding of Exercise Prescription Yes       Intervention Provide education, explanation, and written materials on patient's individual exercise prescription       Expected Outcomes Short Term: Able to explain program exercise prescription;Long Term: Able to explain home exercise prescription to exercise independently                Exercise Goals Re-Evaluation :  Exercise Goals Re-Evaluation     Row Name 09/29/22 1704 10/12/22 1056 10/27/22 0804 11/09/22 1537 11/14/22 1553     Exercise Goal Re-Evaluation   Exercise Goals Review Increase Physical Activity;Able to understand  and use rate of perceived exertion (RPE) scale;Knowledge and understanding of Target Heart Rate Range (THRR);Understanding of Exercise Prescription;Increase Strength and Stamina;Able to check pulse independently Increase Physical Activity;Increase Strength and Stamina;Understanding of Exercise Prescription Increase Physical Activity;Increase Strength and Stamina;Understanding of Exercise Prescription Increase Physical Activity;Increase Strength and Stamina;Understanding of Exercise Prescription Increase Strength and Stamina;Understanding of Exercise Prescription;Increase Physical Activity   Comments Reviewed RPE and dyspnea scale, THR and program prescription with pt today.  Pt voiced understanding and was given a copy of goals to take home. Caitriona is off to a good start in the program. She has already increased her level on the T4 nustep, and Biostep from level 1 to 2. We will continue to monitor her progress in the program. Cecely is doing well in rehab. She has maintained her workloads on most machines although she did improve from level 2 to level 4 on the T6 nustep. She also did increase her speed on the treadmill from 1.8 mph to 1.9 mph with no incline. We will continue to monitor her progress in the program. Gianelle is doing well in rehab. She has been able to increase her workload on the treadmill from 1.9 mph to 2.4 mph. She was also able to maintain her intensity on the T6 nustep and biostep. We will continue to monitor her progress in the prorgram. Jahkayla is improving her levels on all her stations in rehab. She states she is going to walk at home and at the mall for exercise. She has a few weeks left until she is done with the program.   Expected Outcomes Short: Use RPE daily to regulate intensity.  Long: Follow program prescription in THR. Short: Continue to follow current exercise prescription. Long: Continue exercise to improve strength and stamina. Short: Continue to progressively increase treadmill  workload. Long: Continue exercise to improve strength and stamina. Short: Continue to progressively increase treadmill workload. Long: Continue exercise to improve strength and stamina. Short: graduate [employer]. Long: Exercise on a routine basis independently            Discharge Exercise Prescription (Final Exercise Prescription Changes):  Exercise Prescription Changes - 11/09/22 1500       Response to Exercise   Blood Pressure (Admit) 100/60    Blood Pressure (Exit) 102/58    Heart Rate (Admit) 66 bpm    Heart Rate (Exercise) 87 bpm    Heart Rate (Exit) 73 bpm  Rating of Perceived Exertion (Exercise) 13    Perceived Dyspnea (Exercise) 0    Symptoms none    Duration Progress to 30 minutes of  aerobic without signs/symptoms of physical distress    Intensity THRR unchanged      Progression   Progression Continue to progress workloads to maintain intensity without signs/symptoms of physical distress.    Average METs 2.3      Resistance Training   Training Prescription Yes    Weight 4 lb    Reps 10-15      Interval Training   Interval Training No      Treadmill   MPH 2.4    Grade 0    Minutes 15    METs 2.84      REL-XR   Level 2    Minutes 15    METs 3.1      T5 Nustep   Level 2   T6   Minutes 15    METs 1.8      Biostep-RELP   Level 2    Minutes 15    METs 2      Oxygen   Maintain Oxygen Saturation 88% or higher             Nutrition:  Target Goals: Understanding of nutrition guidelines, daily intake of sodium 1500mg , cholesterol 200mg , calories 30% from fat and 7% or less from saturated fats, daily to have 5 or more servings of fruits and vegetables.  Education: All About Nutrition: -Group instruction provided by verbal, written material, interactive activities, discussions, models, and posters to present general guidelines for heart healthy nutrition including fat, fiber, MyPlate, the role of sodium in heart healthy nutrition, utilization  of the nutrition label, and utilization of this knowledge for meal planning. Follow up email sent as well. Written material given at graduation. Flowsheet Row Cardiac Rehab from 11/16/2022 in [facility] Cardiac and Pulmonary Rehab  Education need identified 09/26/22  Date 11/09/22  [part 2 11/16/22]  Educator [facility]  Instruction Review Code 1- Verbalizes Understanding       Biometrics:  Pre Biometrics - 09/26/22 1547       Pre Biometrics   Height 5\' 6"  (1.676 m)    Weight 125 lb 14.4 oz (57.1 kg)    Waist Circumference 31 inches    Hip Circumference 39.5 inches    Waist to Hip Ratio 0.78 %    BMI (Calculated) 20.33    Single Leg Stand 5.62 seconds              Nutrition Therapy Plan and Nutrition Goals:  Nutrition Therapy & Goals - 09/26/22 1640       Nutrition Therapy   Diet Carb controlled, cardiac    Protein (specify units) 75    Fiber 25 grams    Whole Grain Foods 3 servings    Saturated Fats 15 max. grams    Fruits and Vegetables 5 servings/day    Sodium 2 grams      Personal Nutrition Goals   Nutrition Goal Eat 3-5 small meal/snacks per day    Personal Goal #2 Measure carbs per meal, dose insulin and pair carbs with protein or healthy fats    Personal Goal #3 Try greek yogurt or protein shake    Comments Patient reports her appetite has gone down a lot over the past ~3-43months. She says that she often only eats half her plate, says she doses her insulin pump before eating. Recently she has dosed her insulin  for the entire meal then only been able to eat half. Spoke to her about set out portions she can eat, even if small. Educated on the smaller portions of nutrient dense foods and how she could meet goals even with her lesser appetite. Tuna, cheese, peanut butter, beans, lentils, protein shake, chicken, avocado, ect as some examples of foods with lots of nutrition in smaller quantities. Reviewed heart healthy guidelines, but main goals are on feeding her body with her  lesser appetite, choosing calorie dense foods, aiming for smaller more frequent meals and making sure blood sugars stay stable, not dropping due to too much insulin and not enough carbs. Educated on simple sugars and complex carbs, encouraged pairing carbs with protein or healthy fat. Built out several meals and snacks with foods she likes and will eat, focusing small portions, calorie dense foods, adequate protein and blood sugar control.      Intervention Plan   Intervention Prescribe, educate and counsel regarding individualized specific dietary modifications aiming towards targeted core components such as weight, hypertension, lipid management, diabetes, heart failure and other comorbidities.;Nutrition handout(s) given to patient.    Expected Outcomes Short Term Goal: Understand basic principles of dietary content, such as calories, fat, sodium, cholesterol and nutrients.;Short Term Goal: A plan has been developed with personal nutrition goals set during dietitian appointment.;Long Term Goal: Adherence to prescribed nutrition plan.             Nutrition Assessments:  MEDIFICTS Score Key: >=70 Need to make dietary changes  40-70 Heart Healthy Diet <= 40 Therapeutic Level Cholesterol Diet  Flowsheet Row Cardiac Rehab from 09/26/2022 in [facility] Cardiac [facility] Pulmonary Re[facility]  Picture Your Plate Total Score on Admission 60      Picture Your Plate Scores: <24 Unhealthy dietary pattern with much room for improvement. 41-50 Dietary pattern unlikely to meet recommendations for good health and room for improvement. 51-60 More healthful dietary pattern, with some room for improvement.  >60 Healthy dietary pattern, although there may be some specific behaviors that could be improved.    Nutrition Goals Re-Evaluation:  Nutrition Goals Re-Evaluation     Row Name 10/19/22 1614 11/14/22 1603           Goals   Nutrition Goal Eat 3-5 small meal/snacks per day --      Comment Viveka voices  concerns with poor appetite. She cannot think of any foods that she enjoys at this time. She is working to try to incr snacks/meals to 3-5 /day but she is going to begin to drink protein shakes especially when she has little appetite to ensure she is getting more calories and protein needed. She does not drink much water, but does drink tea with artificial sweetner. We discussed replacing at least one tea drink with a glass of water. Movita states she has been eating well and has no questions about her diet. She has however been drinking protien shakes before bed to help with her weight and her blood sugars. She has been able to stablize her blood sugars since adding her protien drink.      Expected Outcome Short:  Try drinking protein shake when appetite is poor, replace tea with one glass of water/day  Long:  Maintain healthy diet/lifestyle Short: Continue to drink protien shakes. Long: maintain diet independently.        Personal Goal #2 Re-Evaluation   Personal Goal #2 Measure carbs per meal, dose insulin and pair carbs with protein or healthy fats --  Personal Goal #3 Re-Evaluation   Personal Goal #3 Try greek yogurt or protein shake --               Nutrition Goals Discharge (Final Nutrition Goals Re-Evaluation):  Nutrition Goals Re-Evaluation - 11/14/22 1603       Goals   Comment Iyanuoluwa states she has been eating well and has no questions about her diet. She has however been drinking protien shakes before bed to help with her weight and her blood sugars. She has been able to stablize her blood sugars since adding her protien drink.    Expected Outcome Short: Continue to drink protien shakes. Long: maintain diet independently.             Psychosocial: Target Goals: Acknowledge presence or absence of significant depression and/or stress, maximize coping skills, provide positive support system. Participant is able to verbalize types and ability to use techniques and skills  needed for reducing stress and depression.   Education: Stress, Anxiety, and Depression - Group verbal and visual presentation to define topics covered.  Reviews how body is impacted by stress, anxiety, and depression.  Also discusses healthy ways to reduce stress and to treat/manage anxiety and depression.  Written material given at graduation. Flowsheet Row Cardiac Rehab from 11/16/2022 in [facility] Cardiac and Pulmonary Rehab  Date 10/12/22  Educator SB  Instruction Review Code 1- [employer] Understanding       Education: Sleep Hygiene -Provides group verbal and written instruction about how sleep can affect your health.  Define sleep hygiene, discuss sleep cycles and impact of sleep habits. Review good sleep hygiene tips.    Initial Review & Psychosocial Screening:  Initial Psych Review & Screening - 09/15/22 1024       Initial Review   Current issues with None Identified      Family Dynamics   Good Support System? Yes    Comments She can look to her husband for support. Devana is feeling better now that she is getting further away from surgery. She is ready to start rehab and get more strength.      Barriers   Psychosocial barriers to participate in program The patient should benefit from training in stress management and relaxation.;There are no identifiable barriers or psychosocial needs.      Screening Interventions   Interventions Encouraged to exercise;To provide support and resources with identified psychosocial needs;Provide feedback about the scores to participant    Expected Outcomes Short Term goal: Utilizing psychosocial counselor, staff and physician to assist with identification of specific Stressors or current issues interfering with healing process. Setting desired goal for each stressor or current issue identified.;Long Term Goal: Stressors or current issues are controlled or eliminated.;Short Term goal: Identification and review with participant of any Quality of Life  or Depression concerns found by scoring the questionnaire.;Long Term goal: The participant improves quality of Life and PHQ9 Scores as seen by post scores and/or verbalization of changes             Quality of Life Scores:   Quality of Life - 09/26/22 1548       Quality of Life   Select Quality of Life      Quality of Life Scores   Health/Function Pre 19.6 %    Socioeconomic Pre 22.13 %    Psych/Spiritual Pre 29.14 %    Family Pre 23.4 %    GLOBAL Pre 22.63 %            Scores  of 19 and below usually indicate a poorer quality of life in these areas.  A difference of  2-3 points is a clinically meaningful difference.  A difference of 2-3 points in the total score of the Quality of Life Index has been associated with significant improvement in overall quality of life, self-image, physical symptoms, and general health in studies assessing change in quality of life.  PHQ-9: Review Flowsheet       10/31/2022 09/26/2022  Depression screen PHQ 2/9  Decreased Interest 0 1  Down, Depressed, Hopeless 0 1  PHQ - 2 Score 0 2  Altered sleeping 1 1  Tired, decreased energy 1 2  Change in appetite 0 1  Feeling bad or failure about yourself  0 0  Trouble concentrating 0 0  Moving slowly or fidgety/restless 0 0  Suicidal thoughts 0 0  PHQ-9 Score 2 6  Difficult doing work/chores Not difficult at all Somewhat difficult    Details           Interpretation of Total Score  Total Score Depression Severity:  1-4 = Minimal depression, 5-9 = Mild depression, 10-14 = Moderate depression, 15-19 = Moderately severe depression, 20-27 = Severe depression   Psychosocial Evaluation and Intervention:  Psychosocial Evaluation - 09/15/22 1025       Psychosocial Evaluation & Interventions   Interventions Encouraged to exercise with the program and follow exercise prescription;Stress management education;Relaxation education    Comments She can look to her husband for support. Larin is  feeling better now that she is getting further away from surgery. She is ready to start rehab and get more strength.    Expected Outcomes Short: Start [facility] to help with mood. Long: Maintain a healthy mental state    Continue Psychosocial Services  Follow up required by staff             Psychosocial Re-Evaluation:  Psychosocial Re-Evaluation     Row Name 10/19/22 1610 10/31/22 1552 11/14/22 1559         Psychosocial Re-Evaluation   Current issues with Current Stress Concerns None Identified None Identified     Comments Pocahontas "worries about so many things" especially things she hears on tv/news. She is working on decreasing outside stressors by not watching the news and cutting out other things that she knows increase her stress. She will continue to exercise that will also help improve overall health including psychosocial health. Reviewed patient health questionnaire (PHQ-9) with patient for follow up. Previously, patients score indicated signs/symptoms of depression.  Reviewed to see if patient is improving symptom wise while in program.  Score improved and patient states that it is because she has been able to get out and garden more. Patient reports no issues with their current mental states, sleep, stress, depression or anxiety. Will follow up with patient in a few weeks for any changes.     Expected Outcomes Short:  Continue to cut out stressors at home, continue exercise  Long:  Maintain healthy mental state Short: Continue to attend [facility] regularly for regular exercise and social engagement. Long: Continue to improve symptoms and manage a positive mental state. Short: Continue to exercise regularly to support mental health and notify staff of any changes. Long: maintain mental health and well being through teaching of rehab or prescribed medications independently.     Interventions Encouraged to attend Pulmonary Rehabilitation for the exercise Encouraged to attend Cardiac  Rehabilitation for the exercise Encouraged to attend Cardiac Rehabilitation for the exercise  Continue Psychosocial Services  Follow up required by staff Follow up required by staff No Follow up required              Psychosocial Discharge (Final Psychosocial Re-Evaluation):  Psychosocial Re-Evaluation - 11/14/22 1559       Psychosocial Re-Evaluation   Current issues with None Identified    Comments Patient reports no issues with their current mental states, sleep, stress, depression or anxiety. Will follow up with patient in a few weeks for any changes.    Expected Outcomes Short: Continue to exercise regularly to support mental health and notify staff of any changes. Long: maintain mental health and well being through teaching of rehab or prescribed medications independently.    Interventions Encouraged to attend Cardiac Rehabilitation for the exercise    Continue Psychosocial Services  No Follow up required             Vocational Rehabilitation: Provide vocational rehab assistance to qualifying candidates.   Vocational Rehab Evaluation & Intervention:   Education: Education Goals: Education classes will be provided on a variety of topics geared toward better understanding of heart health and risk factor modification. Participant will state understanding/return demonstration of topics presented as noted by education test scores.  Learning Barriers/Preferences:  Learning Barriers/Preferences - 09/15/22 1021       Learning Barriers/Preferences   Learning Barriers None    Learning Preferences None             General Cardiac Education Topics:  AED/CPR: - Group verbal and written instruction with the use of models to demonstrate the basic use of the AED with the basic ABC's of resuscitation.   Anatomy and Cardiac Procedures: - Group verbal and visual presentation and models provide information about basic cardiac anatomy and function. Reviews the testing  methods done to diagnose heart disease and the outcomes of the test results. Describes the treatment choices: Medical Management, Angioplasty, or Coronary Bypass Surgery for treating various heart conditions including Myocardial Infarction, Angina, Valve Disease, and Cardiac Arrhythmias.  Written material given at graduation.   Medication Safety: - Group verbal and visual instruction to review commonly prescribed medications for heart and lung disease. Reviews the medication, class of the drug, and side effects. Includes the steps to properly store meds and maintain the prescription regimen.  Written material given at graduation.   Intimacy: - Group verbal instruction through game format to discuss how heart and lung disease can affect sexual intimacy. Written material given at graduation..   Know Your Numbers and Heart Failure: - Group verbal and visual instruction to discuss disease risk factors for cardiac and pulmonary disease and treatment options.  Reviews associated critical values for Overweight/Obesity, Hypertension, Cholesterol, and Diabetes.  Discusses basics of heart failure: signs/symptoms and treatments.  Introduces Heart Failure Zone chart for action plan for heart failure.  Written material given at graduation. Flowsheet Row Cardiac Rehab from 11/16/2022 in [facility] Cardiac and Pulmonary Rehab  Date 10/05/22  Educator SB  Instruction Review Code 1- Verbalizes Understanding       Infection Prevention: - Provides verbal and written material to individual with discussion of infection control including proper hand washing and proper equipment cleaning during exercise session. Flowsheet Row Cardiac Rehab from 11/16/2022 in [facility] Cardiac and Pulmonary Rehab  Date 09/26/22  Educator [facility]  Instruction Review Code 1- Verbalizes Understanding       Falls Prevention: - Provides verbal and written material to individual with discussion of falls prevention and safety. Flowsheet Row  Cardiac Rehab from 11/16/2022 in [facility] Cardiac and Pulmonary Rehab  Date 09/26/22  Educator [facility]  Instruction Review Code 1- Verbalizes Understanding       Other: -Provides group and verbal instruction on various topics (see comments)   Knowledge Questionnaire Score:  Knowledge Questionnaire Score - 09/26/22 1549       Knowledge Questionnaire Score   Pre Score 25/26             Core Components/Risk Factors/Patient Goals at Admission:  Personal Goals and Risk Factors at Admission - 09/26/22 1549       Core Components/Risk Factors/Patient Goals on Admission    Weight Management Weight Maintenance    Intervention Weight Management: Develop a combined nutrition and exercise program designed to reach desired caloric intake, while maintaining appropriate intake of nutrient and fiber, sodium and fats, and appropriate energy expenditure required for the weight goal.;Weight Management: Provide education and appropriate resources to help participant work on and attain dietary goals.;Weight Management/Obesity: Establish reasonable short term and long term weight goals.    Expected Outcomes Short Term: Continue to assess and modify interventions until short term weight is achieved;Long Term: Adherence to nutrition and physical activity/exercise program aimed toward attainment of established weight goal;Understanding recommendations for meals to include 15-35% energy as protein, 25-35% energy from fat, 35-60% energy from carbohydrates, less than 200mg  of dietary cholesterol, 20-35 gm of total fiber daily;Understanding of distribution of calorie intake throughout the day with the consumption of 4-5 meals/snacks;Weight Maintenance: Understanding of the daily nutrition guidelines, which includes 25-35% calories from fat, 7% or less cal from saturated fats, less than 200mg  cholesterol, less than 1.5gm of sodium, & 5 or more servings of fruits and vegetables daily    Diabetes Yes    Intervention Provide  education about signs/symptoms and action to take for hypo/hyperglycemia.;Provide education about proper nutrition, including hydration, and aerobic/resistive exercise prescription along with prescribed medications to achieve blood glucose in normal ranges: Fasting glucose 65-99 mg/dL    Expected Outcomes Short Term: Participant verbalizes understanding of the signs/symptoms and immediate care of hyper/hypoglycemia, proper foot care and importance of medication, aerobic/resistive exercise and nutrition plan for blood glucose control.;Long Term: Attainment of HbA1C < 7%.    Hypertension Yes    Intervention Provide education on lifestyle modifcations including regular physical activity/exercise, weight management, moderate sodium restriction and increased consumption of fresh fruit, vegetables, and low fat dairy, alcohol moderation, and smoking cessation.;Monitor prescription use compliance.    Expected Outcomes Short Term: Continued assessment and intervention until BP is < 140/15mm HG in hypertensive participants. < 130/79mm HG in hypertensive participants with diabetes, heart failure or chronic kidney disease.;Long Term: Maintenance of blood pressure at goal levels.    Lipids Yes    Intervention Provide education and support for participant on nutrition & aerobic/resistive exercise along with prescribed medications to achieve LDL 70mg , HDL >40mg .    Expected Outcomes Short Term: Participant states understanding of desired cholesterol values and is compliant with medications prescribed. Participant is following exercise prescription and nutrition guidelines.;Long Term: Cholesterol controlled with medications as prescribed, with individualized exercise RX and with personalized nutrition plan. Value goals: LDL < 70mg , HDL > 40 mg.             Education:Diabetes - Individual verbal and written instruction to review signs/symptoms of diabetes, desired ranges of glucose level fasting, after meals and  with exercise. Acknowledge that pre and post exercise glucose checks will be done for 3 sessions at entry of program.  Flowsheet Row Cardiac Rehab from 11/16/2022 in [facility] Cardiac and Pulmonary Rehab  Date 09/26/22  Educator [facility]  Instruction Review Code 1- Verbalizes Understanding       Core Components/Risk Factors/Patient Goals Review:   Goals and Risk Factor Review     Row Name 10/19/22 1600 11/14/22 1600           Core Components/Risk Factors/Patient Goals Review   Personal Goals Review Diabetes Diabetes      Review Nour continues to do well in rehab. She has been sleeping late and struggles with low energy. Her blood glucose is managed by her insulin pump and states her readings have varied more than normal recently. She reports poor appetite despite trying to implement recommendations to incr snacks/meals to 3-5 per day. She voices understanding of signs and symptoms of hypoglycemia and her husband also helps her manage. Her monitor will turn off insulin when readings become too low. Discussed plan to increase protein shake intake when she has poor appetite to help manage glucose as well. Discussed trying to walk some after she wakes up in the mornings and to increase water intake that may help with her energy levels. Continue rehab to help improve energy as well. Danyele states she has been doing well with her diabetes. She checks her blood sugars at home regularly. She drinks a protien drink at night before bed to help stablize her blood sugar. She stats since she has added the protien shake her sugars have been better.      Expected Outcomes Short: Continue rehab and walking some at home to help improve energy; implementing nutrition goals to aid in better glucose management  Long:  Independantly manage risk factors Short: continue to monitor sugar. Long: maintain blood sugar reading independently               Core Components/Risk Factors/Patient Goals at Discharge (Final Review):    Goals and Risk Factor Review - 11/14/22 1600       Core Components/Risk Factors/Patient Goals Review   Personal Goals Review Diabetes    Review Adylene states she has been doing well with her diabetes. She checks her blood sugars at home regularly. She drinks a protien drink at night before bed to help stablize her blood sugar. She stats since she has added the protien shake her sugars have been better.    Expected Outcomes Short: continue to monitor sugar. Long: maintain blood sugar reading independently             ITP Comments:  ITP Comments     Row Name 09/15/22 1019 09/26/22 1527 09/29/22 1704 10/05/22 1216 11/02/22 1308   ITP Comments Virtual Visit completed. Patient informed on EP and RD appointment and 6 Minute walk test. Patient also informed of patient health questionnaires on My Chart. Patient Verbalizes understanding. Visit diagnosis can be found in [facility] 08/23/2022. Completed and gym orientation. Initial ITP created and sent for review to Dr. Bethann Punches, Medical Director. First full day of exercise!  Patient was oriented to gym and equipment including functions, settings, policies, and procedures.  Patient's individual exercise prescription and treatment plan were reviewed.  All starting workloads were established based on the results of the 6 minute walk test done at initial orientation visit.  The plan for exercise progression was also introduced and progression will be customized based on patient's performance and goals. 30 Day review completed. Medical Director ITP review done, changes made as directed, and signed approval by Medical Director.  new to program 30 Day review completed. Medical Director ITP review done, changes made as directed, and signed approval by Medical Director.    Row Name 11/23/22 1155           ITP Comments 30 Day review completed. Medical Director ITP review done, changes made as directed, and signed approval by Medical Director.                 Comments:

## 2022-11-23 NOTE — Progress Notes (Signed)
Daily Session Note  Patient Details  Name: Cassandra Joyce MRN: [id number] Date of Birth: 08/21/1945 Referring Provider:   Flowsheet Row Cardiac Rehab from 09/26/2022 in [facility] Cardiac and Pulmonary Rehab  Referring Provider Dr. Dorothyann Peng       Encounter Date: 11/23/2022  Check In:  Session Check In - 11/23/22 1531       Check-In   Supervising physician immediately available to respond to emergencies See telemetry face sheet for immediately available ER MD    Location [facility]-Cardiac & Pulmonary Rehab    Staff Present Susann Givens, RN BSN;Joseph La Ward, RCP,RRT,BSRT;Kelly Algoma, BS, ACSM CEP, Exercise Physiologist;Maxon Conetta BS, , Exercise Physiologist    Virtual Visit No    Medication changes reported     No    Fall or balance concerns reported    No    Warm-up and Cool-down Performed on first and last piece of equipment    Resistance Training Performed Yes    VAD Patient? No    PAD/SET Patient? No      Pain Assessment   Currently in Pain? No/denies                Social History   Tobacco Use  Smoking Status Never  Smokeless Tobacco Never    Goals Met:  Independence with exercise equipment Exercise tolerated well No report of concerns or symptoms today Strength training completed today  Goals Unmet:  Not Applicable  Comments: Pt able to follow exercise prescription today without complaint.  Will continue to monitor for progression.     Reviewed home exercise with pt today.  Pt plans to walk, use hand weights, and stretch for exercise.  Reviewed THR, pulse, RPE, sign and symptoms, pulse oximetery and when to call 911 or MD.  Also discussed weather considerations and indoor options.  Pt voiced understanding.     Dr. Bethann Punches is Medical Director for [facility] Cardiac Rehabilitation.  Dr. Vida Rigger is Medical Director for [facility] Pulmonary Rehabilitation.

## 2022-11-25 ENCOUNTER — Other Ambulatory Visit
Admission: RE | Admit: 2022-11-25 | Discharge: 2022-11-25 | Disposition: A | Discharge status: Home / Self Care | Payer: Medicare Other | Attending: Infectious Diseases | Admitting: Infectious Diseases

## 2022-11-25 DIAGNOSIS — E785 Hyperlipidemia, unspecified: Secondary | ICD-10-CM | POA: Insufficient documentation

## 2022-11-25 DIAGNOSIS — Z951 Presence of aortocoronary bypass graft: Secondary | ICD-10-CM | POA: Insufficient documentation

## 2022-11-25 DIAGNOSIS — I152 Hypertension secondary to endocrine disorders: Secondary | ICD-10-CM | POA: Diagnosis present

## 2022-11-25 DIAGNOSIS — I25119 Atherosclerotic heart disease of native coronary artery with unspecified angina pectoris: Secondary | ICD-10-CM | POA: Diagnosis not present

## 2022-11-25 DIAGNOSIS — E1059 Type 1 diabetes mellitus with other circulatory complications: Secondary | ICD-10-CM | POA: Insufficient documentation

## 2022-11-25 DIAGNOSIS — E10649 Type 1 diabetes mellitus with hypoglycemia without coma: Secondary | ICD-10-CM | POA: Insufficient documentation

## 2022-11-25 DIAGNOSIS — E1069 Type 1 diabetes mellitus with other specified complication: Secondary | ICD-10-CM | POA: Diagnosis not present

## 2022-11-25 DIAGNOSIS — Z9641 Presence of insulin pump (external) (internal): Secondary | ICD-10-CM | POA: Insufficient documentation

## 2022-11-25 LAB — BRAIN NATRIURETIC PEPTIDE: B Natriuretic Peptide: 196.7 pg/mL — ABNORMAL HIGH (ref 0.0–100.0)

## 2022-11-28 ENCOUNTER — Encounter: Payer: Medicare Other | Admitting: *Deleted

## 2022-11-28 DIAGNOSIS — Z951 Presence of aortocoronary bypass graft: Secondary | ICD-10-CM

## 2022-11-28 NOTE — Progress Notes (Signed)
Daily Session Note  Patient Details  Name: Cassandra Joyce MRN: [id number] Date of Birth: 1945-08-18 Referring Provider:   Flowsheet Row Cardiac Rehab from 09/26/2022 in [facility] Cardiac and Pulmonary Rehab  Referring Provider Dr. Dorothyann Peng       Encounter Date: 11/28/2022  Check In:  Session Check In - 11/28/22 1536       Check-In   Supervising physician immediately available to respond to emergencies See telemetry face sheet for immediately available ER MD    Location [facility]-Cardiac & Pulmonary Rehab    Staff Present Susann Givens, RN BSN;Joseph Hood, RCP,RRT,BSRT;Maxon Floris BS, , Exercise Physiologist;Megan Katrinka Blazing, RN, [place]    Virtual Visit No    Medication changes reported     No    Fall or balance concerns reported    No    Warm-up and Cool-down Performed on first and last piece of equipment    Resistance Training Performed Yes    VAD Patient? No    PAD/SET Patient? No      Pain Assessment   Currently in Pain? No/denies                Social History   Tobacco Use  Smoking Status Never  Smokeless Tobacco Never    Goals Met:  Independence with exercise equipment Exercise tolerated well No report of concerns or symptoms today Strength training completed today  Goals Unmet:  Not Applicable  Comments: Pt able to follow exercise prescription today without complaint.  Will continue to monitor for progression.    Dr. Bethann Punches is Medical Director for [facility] Cardiac Rehabilitation.  Dr. Vida Rigger is Medical Director for [facility] Pulmonary Rehabilitation.

## 2022-11-30 ENCOUNTER — Encounter: Payer: Medicare Other | Admitting: *Deleted

## 2022-11-30 DIAGNOSIS — Z951 Presence of aortocoronary bypass graft: Secondary | ICD-10-CM | POA: Diagnosis not present

## 2022-11-30 NOTE — Progress Notes (Signed)
Daily Session Note  Patient Details  Name: Cassandra Joyce MRN: [id number] Date of Birth: Jun 11, 1945 Referring Provider:   Flowsheet Row Cardiac Rehab from 09/26/2022 in [facility] Cardiac and Pulmonary Rehab  Referring Provider Dr. Dorothyann Peng       Encounter Date: 11/30/2022  Check In:  Session Check In - 11/30/22 1524       Check-In   Supervising physician immediately available to respond to emergencies See telemetry face sheet for immediately available ER MD    Location [facility]-Cardiac & Pulmonary Rehab    Staff Present Susann Givens, RN BSN;Kristen Coble, RN,BC,MSN;Kelly Madilyn Fireman, BS, ACSM CEP, Exercise Physiologist;Maxon Conetta BS, , Exercise Physiologist    Virtual Visit No    Medication changes reported     No    Fall or balance concerns reported    No    Warm-up and Cool-down Performed on first and last piece of equipment    Resistance Training Performed Yes    VAD Patient? No    PAD/SET Patient? No      Pain Assessment   Currently in Pain? No/denies                Social History   Tobacco Use  Smoking Status Never  Smokeless Tobacco Never    Goals Met:  Independence with exercise equipment Exercise tolerated well No report of concerns or symptoms today Strength training completed today  Goals Unmet:  Not Applicable  Comments: Pt able to follow exercise prescription today without complaint.  Will continue to monitor for progression.    Dr. Bethann Punches is Medical Director for [facility] Cardiac Rehabilitation.  Dr. Vida Rigger is Medical Director for [facility] Pulmonary Rehabilitation.

## 2022-12-05 ENCOUNTER — Encounter: Payer: Medicare Other | Attending: Internal Medicine | Admitting: *Deleted

## 2022-12-05 DIAGNOSIS — Z951 Presence of aortocoronary bypass graft: Secondary | ICD-10-CM | POA: Diagnosis present

## 2022-12-05 NOTE — Progress Notes (Signed)
Daily Session Note  Patient Details  Name: Cassandra Joyce MRN: [id number] Date of Birth: 21-Dec-1945 Referring Provider:   Flowsheet Row Cardiac Rehab from 09/26/2022 in [facility] Cardiac and Pulmonary Rehab  Referring Provider Dr. Dorothyann Peng       Encounter Date: 12/05/2022  Check In:  Session Check In - 12/05/22 1545       Check-In   Supervising physician immediately available to respond to emergencies See telemetry face sheet for immediately available ER MD    Location [facility]-Cardiac & Pulmonary Rehab    Staff Present Maxon Suzzette Righter, , Exercise Physiologist;Noela Brothers Jewel Baize, RN Atilano Median, RN, Eben Burow RN, BSN    Virtual Visit No    Medication changes reported     No    Fall or balance concerns reported    No    Warm-up and Cool-down Performed on first and last piece of equipment    Resistance Training Performed Yes    VAD Patient? No    PAD/SET Patient? No      Pain Assessment   Currently in Pain? No/denies                Social History   Tobacco Use  Smoking Status Never  Smokeless Tobacco Never    Goals Met:  Independence with exercise equipment Exercise tolerated well No report of concerns or symptoms today Strength training completed today  Goals Unmet:  Not Applicable  Comments:  Cassandra Joyce graduated today from  rehab with 36 sessions completed.  Details of the patient's exercise prescription and what She needs to do in order to continue the prescription and progress were discussed with patient.  Patient was given a copy of prescription and goals.  Patient verbalized understanding. Cassandra Joyce plans to continue to exercise by attending local gym.    Dr. Bethann Punches is Medical Director for [facility] Cardiac Rehabilitation.  Dr. Vida Rigger is Medical Director for [facility] Pulmonary Rehabilitation.

## 2022-12-05 NOTE — Progress Notes (Signed)
Discharge Summary   Cassandra Joyce DOB: 2045-07-26  [name] graduated today from  rehab with 36 sessions completed.  Details of the patient's exercise prescription and what She needs to do in order to continue the prescription and progress were discussed with patient.  Patient was given a copy of prescription and goals.  Patient verbalized understanding. [name] plans to continue to exercise by attending local gym.   6 Minute Walk     Row Name 09/26/22 1528 11/21/22 1552       6 Minute Walk   Phase Initial Discharge    Distance 1300 feet 1365 feet    Distance % Change -- 5 %    Distance Feet Change -- 65 ft    Walk Time 6 minutes 6 minutes    # of Rest Breaks 0 0    MPH 2.46 2.59    METS 2.81 2.48    RPE 12 13    Perceived Dyspnea  1 0    VO2 Peak 9.85 8.68    Symptoms No Yes (comment)    Comments -- back pain 8/10    Resting HR 72 bpm 62 bpm    Resting BP 110/62 100/58    Resting Oxygen Saturation  99 % 98 %    Exercise Oxygen Saturation  during 6 min walk 99 % 99 %    Max Ex. HR 97 bpm 73 bpm    Max Ex. BP 138/70 130/60    2 Minute Post BP 118/70 124/60

## 2022-12-05 NOTE — Progress Notes (Signed)
Cardiac Individual Treatment Plan  Patient Details  Name: Rehana W [place]az[place] MRN: [id number] Date of Birth: 02-09-1945 Referring Provider:   Flowsheet Row Cardiac Rehab from 09/26/2022 in [facility] Cardiac and Pulmonary Rehab  Referring Provider Dr. Dorothyann Peng       Initial Encounter Date:  Flowsheet Row Cardiac Rehab from 09/26/2022 in [facility] Cardiac and Pulmonary Rehab  Date 09/26/22       Visit Diagnosis: S/P CABG x 3  Patient's Home Medications on Admission:  Current Outpatient Medications:    acetaminophen (TYLENOL) 500 MG tablet, Take 2 tablets (1,000 mg total) by mouth every 8 (eight) hours. (Patient not taking: Reported on 08/12/2022), Disp: 30 tablet, Rfl: 0   Alpha-Lipoic Acid 200 MG TABS, Take by mouth daily. (Patient not taking: Reported on 09/15/2022), Disp: , Rfl:    aspirin (ASPIRIN 81) 81 MG chewable tablet, , Disp: , Rfl:    ASPIRIN 81 PO, Take by mouth daily. (Patient not taking: Reported on 09/15/2022), Disp: , Rfl:    Azelastine HCl 137 MCG/SPRAY SOLN, SMARTSIG:1-2 Spray(s) Both Nares Every 12 Hours PRN (Patient not taking: Reported on 09/15/2022), Disp: , Rfl:    B Complex Vitamins (VITAMIN B COMPLEX) TABS, Take 1 tablet by mouth daily. (Patient not taking: Reported on 09/20/2022), Disp: , Rfl:    Calcium Carb-Cholecalciferol (CALCIUM HIGH POTENCY/VITAMIN D) 600-5 MG-MCG TABS, Take by mouth., Disp: , Rfl:    calcium carbonate (OSCAL) 1500 (600 Ca) MG TABS tablet, Take by mouth 2 (two) times daily with a meal. (Patient not taking: Reported on 09/15/2022), Disp: , Rfl:    Cholecalciferol 1.25 MG (50000 UT) TABS, , Disp: , Rfl:    clindamycin (CLEOCIN) 150 MG capsule, TAKE 4 CAPS 1HR PRIOR TO DENTAL APPT WITH FOOD (Patient not taking: Reported on 09/15/2022), Disp: , Rfl:    Continuous Glucose Sensor (DEXCOM G6 SENSOR) MISC, Use 1 each every 10 (ten) days (Patient not taking: Reported on 09/15/2022), Disp: , Rfl:    Continuous Glucose Sensor (DEXCOM G7 SENSOR) MISC, Use 1 Device  every 10 (ten) days (Patient not taking: Reported on 09/15/2022), Disp: , Rfl:    Continuous Glucose Transmitter (DEXCOM G6 TRANSMITTER) MISC, See admin instructions., Disp: , Rfl:    cyanocobalamin 1000 MCG tablet, Take 1,000 mcg by mouth daily. (Patient not taking: Reported on 09/15/2022), Disp: , Rfl:    enalapril (VASOTEC) 2.5 MG tablet, Take 2.5 mg by mouth. (Patient not taking: Reported on 09/15/2022), Disp: , Rfl:    Enoxaparin Sodium (LOVENOX IJ), Lovenox (Patient not taking: Reported on 09/15/2022), Disp: , Rfl:    Enoxaparin Sodium (LOVENOX IJ), , Disp: , Rfl:    fluocinonide (LIDEX) 0.05 % external solution, Apply to aa of scalp qd/bid prn for itch bumps (Patient not taking: Reported on 09/15/2022), Disp: 60 mL, Rfl: 3   fluticasone (FLONASE) 50 MCG/ACT nasal spray, Place 2 sprays into both nostrils daily. (Patient not taking: Reported on 09/15/2022), Disp: , Rfl:    furosemide (LASIX) 20 MG tablet, PLEASE SEE ATTACHED FOR DETAILED DIRECTIONS (Patient not taking: Reported on 09/15/2022), Disp: , Rfl:    hydrochlorothiazide (HYDRODIURIL) 12.5 MG tablet, Take 12.5 mg by mouth daily. (Patient not taking: Reported on 09/15/2022), Disp: , Rfl:    HYDROcodone-acetaminophen (NORCO/VICODIN) 5-325 MG tablet, 1 PO Q 6hrs prn pain (Patient not taking: Reported on 09/15/2022), Disp: , Rfl:    influenza vaccine adjuvanted (FLUAD) 0.5 ML injection, Fluad 2016-17 5yr up(PF)45 mcg(15 mcgx3)/0.5 mL intramuscular syringe  ADM 0.5ML IM UTD (Patient not  taking: Reported on 09/15/2022), Disp: , Rfl:    influenza vaccine adjuvanted (FLUAD) 0.5 ML injection, ADM 0.5ML IM UTD (Patient not taking: Reported on 09/15/2022), Disp: , Rfl:    insulin lispro (HUMALOG) 100 UNIT/ML injection, Inject 30 Units into the skin once. Pt uses insulin pump-uses 100 units every 3 days, Disp: , Rfl:    isosorbide mononitrate (IMDUR) 30 MG 24 hr tablet, Take 30 mg by mouth daily., Disp: , Rfl:    KLOR-CON M10 10 MEQ tablet, PLEASE SEE ATTACHED  FOR DETAILED DIRECTIONS (Patient not taking: Reported on 09/15/2022), Disp: , Rfl:    KLOR-CON M20 20 MEQ tablet, Take by mouth. (Patient not taking: Reported on 09/15/2022), Disp: , Rfl:    metoCLOPramide (REGLAN) 10 MG tablet, Take 10 mg by mouth 3 (three) times daily., Disp: , Rfl:    metolazone (ZAROXOLYN) 2.5 MG tablet, Take by mouth. (Patient not taking: Reported on 09/15/2022), Disp: , Rfl:    metoprolol succinate (TOPROL-XL) 25 MG 24 hr tablet, Take 12.5 mg by mouth at bedtime., Disp: , Rfl:    metoprolol tartrate (LOPRESSOR) 25 MG tablet, Take 25 mg by mouth 2 (two) times daily., Disp: , Rfl:    mometasone (NASONEX) 50 MCG/ACT nasal spray, Place 2 sprays into the nose daily. (Patient not taking: Reported on 09/15/2022), Disp: , Rfl:    nitroGLYCERIN (NITROSTAT) 0.4 MG SL tablet, PLEASE SEE ATTACHED FOR DETAILED DIRECTIONS, Disp: , Rfl:    omeprazole (PRILOSEC) 40 MG capsule, Take 40 mg by mouth daily., Disp: , Rfl:    ondansetron (ZOFRAN) 4 MG tablet, Take 2 tablets twice a day by oral route. (Patient not taking: Reported on 09/15/2022), Disp: , Rfl:    ondansetron (ZOFRAN-ODT) 4 MG disintegrating tablet, Take 4 mg by mouth every 8 (eight) hours as needed. (Patient not taking: Reported on 09/15/2022), Disp: , Rfl:    oxyCODONE (OXY IR/ROXICODONE) 5 MG immediate release tablet, Take 5 mg by mouth every 6 (six) hours as needed. (Patient not taking: Reported on 09/15/2022), Disp: , Rfl:    polyethylene glycol (MIRALAX / GLYCOLAX) 17 g packet, Take by mouth., Disp: , Rfl:    Potassium Chloride ER 20 MEQ TBCR, Take by mouth., Disp: , Rfl:    rosuvastatin (CRESTOR) 40 MG tablet, Take by mouth., Disp: , Rfl:    senna-docusate (SENOKOT-S) 8.6-50 MG tablet, Take by mouth., Disp: , Rfl:    Sennosides (SENNA) 8.6 MG CAPS, senna (Patient not taking: Reported on 09/15/2022), Disp: , Rfl:    Sennosides (SENNA-TIME PO), senna (Patient not taking: Reported on 09/15/2022), Disp: , Rfl:    Sennosides (SENNA-TIME  PO), senna (Patient not taking: Reported on 09/15/2022), Disp: , Rfl:    Sennosides (SENNA-TIME PO), , Disp: , Rfl:    simvastatin (ZOCOR) 40 MG tablet, Take 40 mg by mouth daily. (Patient not taking: Reported on 09/15/2022), Disp: , Rfl:    triamcinolone ointment (KENALOG) 0.1 %, Apply topically 2 (two) times daily. (Patient not taking: Reported on 09/15/2022), Disp: , Rfl:   Past Medical History: Past Medical History:  Diagnosis Date   Actinic keratosis 09/28/2020   upper sternum, bx   Barrett's esophagus    Basal cell carcinoma 11/13/2007   Left nasal root. Excised 12/07/2007, margins free.   Cancer (HCC)    skin   Diabetes mellitus type 1 (HCC)    Medtronic Medimed Insulin pump   Dysplastic nevus 04/07/2009   RLQA. Mild to moderate atypia, edges free.   Essential (primary) hypertension  Female dyspareunia    GERD (gastroesophageal reflux disease)    Barret's esphagus   Hematuria    Hyperlipidemia    Hypertension    Impaired hearing    left ear hearing loss, tinnitis   Menopause    Microscopic colitis    Nasal polyps    Osteoarthritis    right hip, back   Seasonal allergies    Sinusitis    Vertigo    none recently   Wears hearing aid in both ears     Tobacco Use: Social History   Tobacco Use  Smoking Status Never  Smokeless Tobacco Never    Labs: Review Flowsheet       Latest Ref Rng & Units 03/26/2015 08/03/2015 08/12/2022  Labs for ITP Cardiac and Pulmonary Rehab  Hemoglobin A1c 4.8 - 5.6 % 6.8  7.2  -  PH, Arterial 7.35 - 7.45 - - 7.392   PCO2 arterial 32 - 48 mmHg - - 38.1   Bicarbonate 20.0 - 28.0 mmol/L - - 23.2  23.5   TCO2 22 - 32 mmol/L - - 24  25   Acid-base deficit 0.0 - 2.0 mmol/L - - 2.0  3.0   O2 Saturation % - - 92  63     Details       Multiple values from one day are sorted in reverse-chronological order          Exercise Target Goals: Exercise Program Goal: Individual exercise prescription set using results from initial 6 min  walk test and THRR while considering  patient's activity barriers and safety.   Exercise Prescription Goal: Initial exercise prescription builds to 30-45 minutes a day of aerobic activity, 2-3 days per week.  Home exercise guidelines will be given to patient during program as part of exercise prescription that the participant will acknowledge.   Education: Aerobic Exercise: - Group verbal and visual presentation on the components of exercise prescription. Introduces F.I.T.T principle from ACSM for exercise prescriptions.  Reviews F.I.T.T. principles of aerobic exercise including progression. Written material given at graduation.   Education: Resistance Exercise: - Group verbal and visual presentation on the components of exercise prescription. Introduces F.I.T.T principle from ACSM for exercise prescriptions  Reviews F.I.T.T. principles of resistance exercise including progression. Written material given at graduation. Flowsheet Row Cardiac Rehab from 11/23/2022 in [facility] Cardiac and Pulmonary Rehab  Date 10/26/22  Educator [facility]  Instruction Review Code 1- [employer] Understanding        Education: Exercise & Equipment Safety: - Individual verbal instruction and demonstration of equipment use and safety with use of the equipment. Flowsheet Row Cardiac Rehab from 11/23/2022 in [facility] Cardiac and Pulmonary Rehab  Date 09/26/22  Educator [facility]  Instruction Review Code 1- Verbalizes Understanding       Education: Exercise Physiology & General Exercise Guidelines: - Group verbal and written instruction with models to review the exercise physiology of the cardiovascular system and associated critical values. Provides general exercise guidelines with specific guidelines to those with heart or lung disease.  Flowsheet Row Cardiac Rehab from 11/23/2022 in [facility] Cardiac and Pulmonary Rehab  Date 10/19/22  Educator [facility]  Instruction Review Code 1- [employer] Understanding       Education:  Flexibility, Balance, Mind/Body Relaxation: - Group verbal and visual presentation with interactive activity on the components of exercise prescription. Introduces F.I.T.T principle from ACSM for exercise prescriptions. Reviews F.I.T.T. principles of flexibility and balance exercise training including progression. Also discusses the mind body connection.  Reviews various relaxation  techniques to help reduce and manage stress (i.e. Deep breathing, progressive muscle relaxation, and visualization). Balance handout provided to take home. Written material given at graduation. Flowsheet Row Cardiac Rehab from 11/23/2022 in [facility] Cardiac and Pulmonary Rehab  Date 10/26/22  Educator [facility]  Instruction Review Code 1- Verbalizes Understanding       Activity Barriers & Risk Stratification:  Activity Barriers & Cardiac Risk Stratification - 09/26/22 1529       Activity Barriers & Cardiac Risk Stratification   Activity Barriers Other (comment)    Comments still under lifting restrictions post op and history of right hip fracture and repair (currently no pain just limited ROM)    Cardiac Risk Stratification High             6 Minute Walk:  6 Minute Walk     Row Name 09/26/22 1528 11/21/22 1552       6 Minute Walk   Phase Initial Discharge    Distance 1300 feet 1365 feet    Distance % Change -- 5 %    Distance Feet Change -- 65 ft    Walk Time 6 minutes 6 minutes    # of Rest Breaks 0 0    MPH 2.46 2.59    METS 2.81 2.48    RPE 12 13    Perceived Dyspnea  1 0    VO2 Peak 9.85 8.68    Symptoms No Yes (comment)    Comments -- back pain 8/10    Resting HR 72 bpm 62 bpm    Resting BP 110/62 100/58    Resting Oxygen Saturation  99 % 98 %    Exercise Oxygen Saturation  during 6 min walk 99 % 99 %    Max Ex. HR 97 bpm 73 bpm    Max Ex. BP 138/70 130/60    2 Minute Post BP 118/70 124/60             Oxygen Initial Assessment:   Oxygen Re-Evaluation:   Oxygen Discharge (Final  Oxygen Re-Evaluation):   Initial Exercise Prescription:  Initial Exercise Prescription - 09/26/22 1500       Date of Initial Exercise RX and Referring Provider   Date 09/26/22    Referring Provider Dr. Dorothyann Peng      Oxygen   Maintain Oxygen Saturation 88% or higher      Treadmill   MPH 2    Grade 1    Minutes 15    METs 2.86      NuStep   Level 2   T6   SPM 80    Minutes 15    METs 2.81      REL-XR   Level 2    Speed 50    Minutes 15    METs 2.81      Biostep-RELP   Level 2    SPM 50    Minutes 15    METs 2.81      Track   Laps 30    Minutes 15    METs 2.63      Prescription Details   Frequency (times per week) 3    Duration Progress to 30 minutes of continuous aerobic without signs/symptoms of physical distress      Intensity   THRR 40-80% of Max Heartrate 99-127    Ratings of Perceived Exertion 11-13    Perceived Dyspnea 0-4      Progression   Progression Continue to progress workloads to maintain intensity  without signs/symptoms of physical distress.      Resistance Training   Training Prescription Yes    Weight 1    Reps 10-15             Perform Capillary Blood Glucose checks as needed.  Exercise Prescription Changes:   Exercise Prescription Changes     Row Name 09/26/22 1500 10/12/22 1000 10/27/22 0700 11/09/22 1500 11/23/22 1500     Response to Exercise   Blood Pressure (Admit) 110/62 130/60 122/60 100/60 --   Blood Pressure (Exercise) 138/70 134/60 128/54 -- --   Blood Pressure (Exit) 118/70 122/60 98/58 102/58 --   Heart Rate (Admit) 72 bpm 74 bpm 67 bpm 66 bpm --   Heart Rate (Exercise) 97 bpm 96 bpm 94 bpm 87 bpm --   Heart Rate (Exit) 77 bpm 80 bpm 72 bpm 73 bpm --   Oxygen Saturation (Admit) 99 % -- -- -- --   Oxygen Saturation (Exercise) 99 % -- -- -- --   Oxygen Saturation (Exit) 98 % -- -- -- --   Rating of Perceived Exertion (Exercise) 12 15 13 13  --   Perceived Dyspnea (Exercise) 1 0 -- 0 --   Symptoms none  none none none --   Comments 6 MWT results First 2 weeks of exercise -- -- --   Duration -- Progress to 30 minutes of  aerobic without signs/symptoms of physical distress Progress to 30 minutes of  aerobic without signs/symptoms of physical distress Progress to 30 minutes of  aerobic without signs/symptoms of physical distress Progress to 30 minutes of  aerobic without signs/symptoms of physical distress   Intensity -- THRR unchanged THRR unchanged THRR unchanged THRR unchanged     Progression   Progression -- Continue to progress workloads to maintain intensity without signs/symptoms of physical distress. Continue to progress workloads to maintain intensity without signs/symptoms of physical distress. Continue to progress workloads to maintain intensity without signs/symptoms of physical distress. Continue to progress workloads to maintain intensity without signs/symptoms of physical distress.   Average METs -- 2.07 2.4 2.3 2.3     Resistance Training   Training Prescription -- Yes Yes Yes Yes   Weight -- 1 1 lb 4 lb 4 lb   Reps -- 10-15 10-15 10-15 10-15     Interval Training   Interval Training -- No No No No     Treadmill   MPH -- -- 1.9 2.4 2.4   Grade -- -- 0 0 0   Minutes -- -- 15 15 15    METs -- -- 2.45 2.84 2.84     Recumbant Bike   Level -- 1 -- -- --   Watts -- 25 -- -- --   Minutes -- 15 -- -- --   METs -- 3.34 -- -- --     NuStep   Level -- 2 4  T6 nustep -- --   Minutes -- 15 15 -- --   METs -- 1.5 2 -- --     REL-XR   Level -- -- 2 2 2    Minutes -- -- 15 15 15    METs -- -- 5.4 3.1 3.1     T5 Nustep   Level -- 1  T6 -- 2  T6 2  T6   Minutes -- 15 -- 15 15   METs -- 1.7 -- 1.8 1.8     Biostep-RELP   Level -- 2 2 2 2    Minutes -- 15 15 15  15  METs -- 2 3 2 2      Track   Laps -- 22 -- -- --   Minutes -- 15 -- -- --   METs -- 2.2 -- -- --     Home Exercise Plan   Plans to continue exercise at -- -- -- -- Home (comment)  walk, weights, and stretching    Frequency -- -- -- -- Add 2 additional days to program exercise sessions.   Initial Home Exercises Provided -- -- -- -- 11/23/22     Oxygen   Maintain Oxygen Saturation -- 88% or higher 88% or higher 88% or higher 88% or higher    Row Name 11/24/22 1600 12/05/22 1100           Response to Exercise   Blood Pressure (Admit) 108/52 130/84      Blood Pressure (Exit) 100/60 122/62      Heart Rate (Admit) 74 bpm 73 bpm      Heart Rate (Exercise) 92 bpm 76 bpm      Heart Rate (Exit) 74 bpm 70 bpm      Oxygen Saturation (Admit) -- 98 %      Oxygen Saturation (Exercise) -- 92 %      Oxygen Saturation (Exit) -- 98 %      Rating of Perceived Exertion (Exercise) 15 13      Perceived Dyspnea (Exercise) 0 0      Symptoms none none      Duration Progress to 30 minutes of  aerobic without signs/symptoms of physical distress Progress to 30 minutes of  aerobic without signs/symptoms of physical distress      Intensity THRR unchanged THRR unchanged        Progression   Progression Continue to progress workloads to maintain intensity without signs/symptoms of physical distress. Continue to progress workloads to maintain intensity without signs/symptoms of physical distress.      Average METs 2.54 2.6        Resistance Training   Training Prescription Yes Yes      Weight 4 lb 4 lb      Reps 10-15 10-15        Interval Training   Interval Training No No        Treadmill   MPH 3 3      Grade 0 0      Minutes 15 15      METs 3.3 3.3        REL-XR   Level -- 2      Minutes -- 15        T5 Nustep   Level 3  T6 3  T6      Minutes 15 15      METs 2.1 2        Biostep-RELP   Level 3 2      Minutes 15 15      METs 2 2        Home Exercise Plan   Plans to continue exercise at Home (comment)  walk, weights, and stretching Home (comment)  walk, weights, and stretching      Frequency Add 2 additional days to program exercise sessions. Add 2 additional days to program exercise sessions.       Initial Home Exercises Provided 11/23/22 11/23/22        Oxygen   Maintain Oxygen Saturation 88% or higher 88% or higher               Exercise  Comments:   Exercise Comments     Row Name 09/29/22 1704           Exercise Comments First full day of exercise!  Patient was oriented to gym and equipment including functions, settings, policies, and procedures.  Patient's individual exercise prescription and treatment plan were reviewed.  All starting workloads were established based on the results of the 6 minute walk test done at initial orientation visit.  The plan for exercise progression was also introduced and progression will be customized based on patient's performance and goals.                Exercise Goals and Review:   Exercise Goals     Row Name 09/26/22 1546             Exercise Goals   Increase Physical Activity Yes       Intervention Provide advice, education, support and counseling about physical activity/exercise needs.;Develop an individualized exercise prescription for aerobic and resistive training based on initial evaluation findings, risk stratification, comorbidities and participant's personal goals.       Expected Outcomes Short Term: Attend rehab on a regular basis to increase amount of physical activity.;Long Term: Add in home exercise to make exercise part of routine and to increase amount of physical activity.;Long Term: Exercising regularly at least 3-5 days a week.       Increase Strength and Stamina Yes       Intervention Provide advice, education, support and counseling about physical activity/exercise needs.;Develop an individualized exercise prescription for aerobic and resistive training based on initial evaluation findings, risk stratification, comorbidities and participant's personal goals.       Expected Outcomes Short Term: Increase workloads from initial exercise prescription for resistance, speed, and METs.;Short Term: Perform  resistance training exercises routinely during rehab and add in resistance training at home;Long Term: Improve cardiorespiratory fitness, muscular endurance and strength as measured by increased METs and functional capacity ( )       Able to understand and use rate of perceived exertion (RPE) scale Yes       Intervention Provide education and explanation on how to use RPE scale       Expected Outcomes Short Term: Able to use RPE daily in rehab to express subjective intensity level;Long Term:  Able to use RPE to guide intensity level when exercising independently       Able to understand and use Dyspnea scale Yes       Intervention Provide education and explanation on how to use Dyspnea scale       Expected Outcomes Short Term: Able to use Dyspnea scale daily in rehab to express subjective sense of shortness of breath during exertion;Long Term: Able to use Dyspnea scale to guide intensity level when exercising independently       Knowledge and understanding of Target Heart Rate Range (THRR) Yes       Intervention Provide education and explanation of THRR including how the numbers were predicted and where they are located for reference       Expected Outcomes Short Term: Able to state/look up THRR;Long Term: Able to use THRR to govern intensity when exercising independently;Short Term: Able to use daily as guideline for intensity in rehab       Able to check pulse independently Yes       Intervention Provide education and demonstration on how to check pulse in carotid and radial arteries.;Review the importance of being able to check your own pulse  for safety during independent exercise       Expected Outcomes Short Term: Able to explain why pulse checking is important during independent exercise;Long Term: Able to check pulse independently and accurately       Understanding of Exercise Prescription Yes       Intervention Provide education, explanation, and written materials on patient's individual  exercise prescription       Expected Outcomes Short Term: Able to explain program exercise prescription;Long Term: Able to explain home exercise prescription to exercise independently                Exercise Goals Re-Evaluation :  Exercise Goals Re-Evaluation     Row Name 09/29/22 1704 10/12/22 1056 10/27/22 0804 11/09/22 1537 11/14/22 1553     Exercise Goal Re-Evaluation   Exercise Goals Review Increase Physical Activity;Able to understand and use rate of perceived exertion (RPE) scale;Knowledge and understanding of Target Heart Rate Range (THRR);Understanding of Exercise Prescription;Increase Strength and Stamina;Able to check pulse independently Increase Physical Activity;Increase Strength and Stamina;Understanding of Exercise Prescription Increase Physical Activity;Increase Strength and Stamina;Understanding of Exercise Prescription Increase Physical Activity;Increase Strength and Stamina;Understanding of Exercise Prescription Increase Strength and Stamina;Understanding of Exercise Prescription;Increase Physical Activity   Comments Reviewed RPE and dyspnea scale, THR and program prescription with pt today.  Pt voiced understanding and was given a copy of goals to take home. Francoise is off to a good start in the program. She has already increased her level on the T4 nustep, and Biostep from level 1 to 2. We will continue to monitor her progress in the program. Javona is doing well in rehab. She has maintained her workloads on most machines although she did improve from level 2 to level 4 on the T6 nustep. She also did increase her speed on the treadmill from 1.8 mph to 1.9 mph with no incline. We will continue to monitor her progress in the program. Jameerah is doing well in rehab. She has been able to increase her workload on the treadmill from 1.9 mph to 2.4 mph. She was also able to maintain her intensity on the T6 nustep and biostep. We will continue to monitor her progress in the prorgram. Demetri is  improving her levels on all her stations in rehab. She states she is going to walk at home and at the mall for exercise. She has a few weeks left until she is done with the program.   Expected Outcomes Short: Use RPE daily to regulate intensity.  Long: Follow program prescription in THR. Short: Continue to follow current exercise prescription. Long: Continue exercise to improve strength and stamina. Short: Continue to progressively increase treadmill workload. Long: Continue exercise to improve strength and stamina. Short: Continue to progressively increase treadmill workload. Long: Continue exercise to improve strength and stamina. Short: graduate [facility]. Long: Exercise on a routine basis independently    Row Name 11/23/22 1554 11/24/22 1615 12/05/22 1105         Exercise Goal Re-Evaluation   Exercise Goals Review Increase Physical Activity;Increase Strength and Stamina;Able to understand and use rate of perceived exertion (RPE) scale;Able to understand and use Dyspnea scale;Knowledge and understanding of Target Heart Rate Range (THRR);Able to check pulse independently;Understanding of Exercise Prescription Increase Physical Activity;Increase Strength and Stamina;Understanding of Exercise Prescription Increase Physical Activity;Increase Strength and Stamina;Understanding of Exercise Prescription     Comments Reviewed home exercise with pt today.  Pt plans to walk, use hand weights, and stretch for exercise.  Reviewed THR, pulse, RPE,  sign and symptoms, pulse oximetery and when to call 911 or MD.  Also discussed weather considerations and indoor options.  Pt voiced understanding. Kristyn continues to do well in rehab. She has been able to increase her speed on the treadmill from 2.4 mph to 3 mph. She has also increased her level on the T6 nustep and biostep to level 3. We will continue to monitor her progress in the program. Darcia continues to do well in the program. She was not able to make any  improvements during this review, but she was able to maintain her workloads. We will continue to monitor, and encourage her progression in the program.     Expected Outcomes Short: add 1-2 days a week of exercise at home on off days of rehab. Long: Continue with independent exercsie program upon graduation from rehab. Short: Continue to follow current exercise prescription. Long: Continue exercise to improve strength and stamina. Short: Continue to progressively increase treadmill workload. Long: Continue exercise to improve strength and stamina.              Discharge Exercise Prescription (Final Exercise Prescription Changes):  Exercise Prescription Changes - 12/05/22 1100       Response to Exercise   Blood Pressure (Admit) 130/84    Blood Pressure (Exit) 122/62    Heart Rate (Admit) 73 bpm    Heart Rate (Exercise) 76 bpm    Heart Rate (Exit) 70 bpm    Oxygen Saturation (Admit) 98 %    Oxygen Saturation (Exercise) 92 %    Oxygen Saturation (Exit) 98 %    Rating of Perceived Exertion (Exercise) 13    Perceived Dyspnea (Exercise) 0    Symptoms none    Duration Progress to 30 minutes of  aerobic without signs/symptoms of physical distress    Intensity THRR unchanged      Progression   Progression Continue to progress workloads to maintain intensity without signs/symptoms of physical distress.    Average METs 2.6      Resistance Training   Training Prescription Yes    Weight 4 lb    Reps 10-15      Interval Training   Interval Training No      Treadmill   MPH 3    Grade 0    Minutes 15    METs 3.3      REL-XR   Level 2    Minutes 15      T5 Nustep   Level 3   T6   Minutes 15    METs 2      Biostep-RELP   Level 2    Minutes 15    METs 2      Home Exercise Plan   Plans to continue exercise at Home (comment)   walk, weights, and stretching   Frequency Add 2 additional days to program exercise sessions.    Initial Home Exercises Provided 11/23/22      Oxygen    Maintain Oxygen Saturation 88% or higher             Nutrition:  Target Goals: Understanding of nutrition guidelines, daily intake of sodium 1500mg , cholesterol 200mg , calories 30% from fat and 7% or less from saturated fats, daily to have 5 or more servings of fruits and vegetables.  Education: All About Nutrition: -Group instruction provided by verbal, written material, interactive activities, discussions, models, and posters to present general guidelines for heart healthy nutrition including fat, fiber, MyPlate, the role of sodium in  heart healthy nutrition, utilization of the nutrition label, and utilization of this knowledge for meal planning. Follow up email sent as well. Written material given at graduation. Flowsheet Row Cardiac Rehab from 11/23/2022 in [facility] Cardiac and Pulmonary Rehab  Education need identified 09/26/22  Date 11/09/22  [part 2 11/16/22]  Educator JG  Instruction Review Code 1- Verbalizes Understanding       Biometrics:  Pre Biometrics - 09/26/22 1547       Pre Biometrics   Height 5\' 6"  (1.676 m)    Weight 125 lb 14.4 oz (57.1 kg)    Waist Circumference 31 inches    Hip Circumference 39.5 inches    Waist to Hip Ratio 0.78 %    BMI (Calculated) 20.33    Single Leg Stand 5.62 seconds              Nutrition Therapy Plan and Nutrition Goals:  Nutrition Therapy & Goals - 09/26/22 1640       Nutrition Therapy   Diet Carb controlled, cardiac    Protein (specify units) 75    Fiber 25 grams    Whole Grain Foods 3 servings    Saturated Fats 15 max. grams    Fruits and Vegetables 5 servings/day    Sodium 2 grams      Personal Nutrition Goals   Nutrition Goal Eat 3-5 small meal/snacks per day    Personal Goal #2 Measure carbs per meal, dose insulin and pair carbs with protein or healthy fats    Personal Goal #3 Try greek yogurt or protein shake    Comments Patient reports her appetite has gone down a lot over the past ~3-59months. She says  that she often only eats half her plate, says she doses her insulin pump before eating. Recently she has dosed her insulin for the entire meal then only been able to eat half. Spoke to her about set out portions she can eat, even if small. Educated on the smaller portions of nutrient dense foods and how she could meet goals even with her lesser appetite. Tuna, cheese, peanut butter, beans, lentils, protein shake, chicken, avocado, ect as some examples of foods with lots of nutrition in smaller quantities. Reviewed heart healthy guidelines, but main goals are on feeding her body with her lesser appetite, choosing calorie dense foods, aiming for smaller more frequent meals and making sure blood sugars stay stable, not dropping due to too much insulin and not enough carbs. Educated on simple sugars and complex carbs, encouraged pairing carbs with protein or healthy fat. Built out several meals and snacks with foods she likes and will eat, focusing small portions, calorie dense foods, adequate protein and blood sugar control.      Intervention Plan   Intervention Prescribe, educate and counsel regarding individualized specific dietary modifications aiming towards targeted core components such as weight, hypertension, lipid management, diabetes, heart failure and other comorbidities.;Nutrition handout(s) given to patient.    Expected Outcomes Short Term Goal: Understand basic principles of dietary content, such as calories, fat, sodium, cholesterol and nutrients.;Short Term Goal: A plan has been developed with personal nutrition goals set during dietitian appointment.;Long Term Goal: Adherence to prescribed nutrition plan.             Nutrition Assessments:  MEDIFICTS Score Key: >=70 Need to make dietary changes  40-70 Heart Healthy Diet <= 40 Therapeutic Level Cholesterol Diet  Flowsheet Row Cardiac Rehab from 11/23/2022 in [facility] Cardiac and Pulmonary Rehab  Picture Your Plate Total  Score on Discharge  68      Picture Your Plate Scores: <57 Unhealthy dietary pattern with much room for improvement. 41-50 Dietary pattern unlikely to meet recommendations for good health and room for improvement. 51-60 More healthful dietary pattern, with some room for improvement.  >60 Healthy dietary pattern, although there may be some specific behaviors that could be improved.    Nutrition Goals Re-Evaluation:  Nutrition Goals Re-Evaluation     Row Name 10/19/22 1614 11/14/22 1603           Goals   Nutrition Goal Eat 3-5 small meal/snacks per day --      Comment Vergie voices concerns with poor appetite. She cannot think of any foods that she enjoys at this time. She is working to try to incr snacks/meals to 3-5 /day but she is going to begin to drink protein shakes especially when she has little appetite to ensure she is getting more calories and protein needed. She does not drink much water, but does drink tea with artificial sweetner. We discussed replacing at least one tea drink with a glass of water. Truby states she has been eating well and has no questions about her diet. She has however been drinking protien shakes before bed to help with her weight and her blood sugars. She has been able to stablize her blood sugars since adding her protien drink.      Expected Outcome Short:  Try drinking protein shake when appetite is poor, replace tea with one glass of water/day  Long:  Maintain healthy diet/lifestyle Short: Continue to drink protien shakes. Long: maintain diet independently.        Personal Goal #2 Re-Evaluation   Personal Goal #2 Measure carbs per meal, dose insulin and pair carbs with protein or healthy fats --        Personal Goal #3 Re-Evaluation   Personal Goal #3 Try greek yogurt or protein shake --               Nutrition Goals Discharge (Final Nutrition Goals Re-Evaluation):  Nutrition Goals Re-Evaluation - 11/14/22 1603       Goals   Comment Maxcine states she has been  eating well and has no questions about her diet. She has however been drinking protien shakes before bed to help with her weight and her blood sugars. She has been able to stablize her blood sugars since adding her protien drink.    Expected Outcome Short: Continue to drink protien shakes. Long: maintain diet independently.             Psychosocial: Target Goals: Acknowledge presence or absence of significant depression and/or stress, maximize coping skills, provide positive support system. Participant is able to verbalize types and ability to use techniques and skills needed for reducing stress and depression.   Education: Stress, Anxiety, and Depression - Group verbal and visual presentation to define topics covered.  Reviews how body is impacted by stress, anxiety, and depression.  Also discusses healthy ways to reduce stress and to treat/manage anxiety and depression.  Written material given at graduation. Flowsheet Row Cardiac Rehab from 11/23/2022 in [facility] Cardiac and Pulmonary Rehab  Date 10/12/22  Educator SB  Instruction Review Code 1- [employer] Understanding       Education: Sleep Hygiene -Provides group verbal and written instruction about how sleep can affect your health.  Define sleep hygiene, discuss sleep cycles and impact of sleep habits. Review good sleep hygiene tips.    Initial Review &  Psychosocial Screening:  Initial Psych Review & Screening - 09/15/22 1024       Initial Review   Current issues with None Identified      Family Dynamics   Good Support System? Yes    Comments She can look to her husband for support. Brier is feeling better now that she is getting further away from surgery. She is ready to start rehab and get more strength.      Barriers   Psychosocial barriers to participate in program The patient should benefit from training in stress management and relaxation.;There are no identifiable barriers or psychosocial needs.      Screening  Interventions   Interventions Encouraged to exercise;To provide support and resources with identified psychosocial needs;Provide feedback about the scores to participant    Expected Outcomes Short Term goal: Utilizing psychosocial counselor, staff and physician to assist with identification of specific Stressors or current issues interfering with healing process. Setting desired goal for each stressor or current issue identified.;Long Term Goal: Stressors or current issues are controlled or eliminated.;Short Term goal: Identification and review with participant of any Quality of Life or Depression concerns found by scoring the questionnaire.;Long Term goal: The participant improves quality of Life and PHQ9 Scores as seen by post scores and/or verbalization of changes             Quality of Life Scores:   Quality of Life - 11/23/22 1541       Quality of Life   Select Quality of Life      Quality of Life Scores   Health/Function Pre 19.6 %    Health/Function Post 27.43 %    Health/Function % Change 39.95 %    Socioeconomic Pre 22.13 %    Socioeconomic Post 27.6 %    Socioeconomic % Change  24.72 %    Psych/Spiritual Pre 29.14 %    Psych/Spiritual Post 30 %    Psych/Spiritual % Change 2.95 %    Family Pre 23.4 %    Family Post 30 %    Family % Change 28.21 %    GLOBAL Pre 22.63 %    GLOBAL Post 28.4 %    GLOBAL % Change 25.5 %            Scores of 19 and below usually indicate a poorer quality of life in these areas.  A difference of  2-3 points is a clinically meaningful difference.  A difference of 2-3 points in the total score of the Quality of Life Index has been associated with significant improvement in overall quality of life, self-image, physical symptoms, and general health in studies assessing change in quality of life.  PHQ-9: Review Flowsheet       11/23/2022 10/31/2022 09/26/2022  Depression screen PHQ 2/9  Decreased Interest 0 0 1  Down, Depressed, Hopeless 0  0 1  PHQ - 2 Score 0 0 2  Altered sleeping 0 1 1  Tired, decreased energy 0 1 2  Change in appetite 0 0 1  Feeling bad or failure about yourself  0 0 0  Trouble concentrating 0 0 0  Moving slowly or fidgety/restless 0 0 0  Suicidal thoughts 0 0 0  PHQ-9 Score 0 2 6  Difficult doing work/chores - Not difficult at all Somewhat difficult    Details           Interpretation of Total Score  Total Score Depression Severity:  1-4 = Minimal depression, 5-9 = Mild depression, 10-14 =  Moderate depression, 15-19 = Moderately severe depression, 20-27 = Severe depression   Psychosocial Evaluation and Intervention:  Psychosocial Evaluation - 09/15/22 1025       Psychosocial Evaluation & Interventions   Interventions Encouraged to exercise with the program and follow exercise prescription;Stress management education;Relaxation education    Comments She can look to her husband for support. Caitlynn is feeling better now that she is getting further away from surgery. She is ready to start rehab and get more strength.    Expected Outcomes Short: Start [facility] to help with mood. Long: Maintain a healthy mental state    Continue Psychosocial Services  Follow up required by staff             Psychosocial Re-Evaluation:  Psychosocial Re-Evaluation     Row Name 10/19/22 1610 10/31/22 1552 11/14/22 1559         Psychosocial Re-Evaluation   Current issues with Current Stress Concerns None Identified None Identified     Comments Calleigh "worries about so many things" especially things she hears on tv/news. She is working on decreasing outside stressors by not watching the news and cutting out other things that she knows increase her stress. She will continue to exercise that will also help improve overall health including psychosocial health. Reviewed patient health questionnaire (PHQ-9) with patient for follow up. Previously, patients score indicated signs/symptoms of depression.  Reviewed to see  if patient is improving symptom wise while in program.  Score improved and patient states that it is because she has been able to get out and garden more. Patient reports no issues with their current mental states, sleep, stress, depression or anxiety. Will follow up with patient in a few weeks for any changes.     Expected Outcomes Short:  Continue to cut out stressors at home, continue exercise  Long:  Maintain healthy mental state Short: Continue to attend [facility] regularly for regular exercise and social engagement. Long: Continue to improve symptoms and manage a positive mental state. Short: Continue to exercise regularly to support mental health and notify staff of any changes. Long: maintain mental health and well being through teaching of rehab or prescribed medications independently.     Interventions Encouraged to attend Pulmonary Rehabilitation for the exercise Encouraged to attend Cardiac Rehabilitation for the exercise Encouraged to attend Cardiac Rehabilitation for the exercise     Continue Psychosocial Services  Follow up required by staff Follow up required by staff No Follow up required              Psychosocial Discharge (Final Psychosocial Re-Evaluation):  Psychosocial Re-Evaluation - 11/14/22 1559       Psychosocial Re-Evaluation   Current issues with None Identified    Comments Patient reports no issues with their current mental states, sleep, stress, depression or anxiety. Will follow up with patient in a few weeks for any changes.    Expected Outcomes Short: Continue to exercise regularly to support mental health and notify staff of any changes. Long: maintain mental health and well being through teaching of rehab or prescribed medications independently.    Interventions Encouraged to attend Cardiac Rehabilitation for the exercise    Continue Psychosocial Services  No Follow up required             Vocational Rehabilitation: Provide vocational rehab assistance  to qualifying candidates.   Vocational Rehab Evaluation & Intervention:   Education: Education Goals: Education classes will be provided on a variety of topics geared toward better understanding  of heart health and risk factor modification. Participant will state understanding/return demonstration of topics presented as noted by education test scores.  Learning Barriers/Preferences:  Learning Barriers/Preferences - 09/15/22 1021       Learning Barriers/Preferences   Learning Barriers None    Learning Preferences None             General Cardiac Education Topics:  AED/CPR: - Group verbal and written instruction with the use of models to demonstrate the basic use of the AED with the basic ABC's of resuscitation.   Anatomy and Cardiac Procedures: - Group verbal and visual presentation and models provide information about basic cardiac anatomy and function. Reviews the testing methods done to diagnose heart disease and the outcomes of the test results. Describes the treatment choices: Medical Management, Angioplasty, or Coronary Bypass Surgery for treating various heart conditions including Myocardial Infarction, Angina, Valve Disease, and Cardiac Arrhythmias.  Written material given at graduation. Flowsheet Row Cardiac Rehab from 11/23/2022 in [facility] Cardiac and Pulmonary Rehab  Date 11/23/22  Educator SB  Instruction Review Code 1- Verbalizes Understanding       Medication Safety: - Group verbal and visual instruction to review commonly prescribed medications for heart and lung disease. Reviews the medication, class of the drug, and side effects. Includes the steps to properly store meds and maintain the prescription regimen.  Written material given at graduation.   Intimacy: - Group verbal instruction through game format to discuss how heart and lung disease can affect sexual intimacy. Written material given at graduation..   Know Your Numbers and Heart Failure: - Group  verbal and visual instruction to discuss disease risk factors for cardiac and pulmonary disease and treatment options.  Reviews associated critical values for Overweight/Obesity, Hypertension, Cholesterol, and Diabetes.  Discusses basics of heart failure: signs/symptoms and treatments.  Introduces Heart Failure Zone chart for action plan for heart failure.  Written material given at graduation. Flowsheet Row Cardiac Rehab from 11/23/2022 in [facility] Cardiac and Pulmonary Rehab  Date 10/05/22  Educator SB  Instruction Review Code 1- Verbalizes Understanding       Infection Prevention: - Provides verbal and written material to individual with discussion of infection control including proper hand washing and proper equipment cleaning during exercise session. Flowsheet Row Cardiac Rehab from 11/23/2022 in [facility] Cardiac and Pulmonary Rehab  Date 09/26/22  Educator [facility]  Instruction Review Code 1- Verbalizes Understanding       Falls Prevention: - Provides verbal and written material to individual with discussion of falls prevention and safety. Flowsheet Row Cardiac Rehab from 11/23/2022 in [facility] Cardiac and Pulmonary Rehab  Date 09/26/22  Educator [facility]  Instruction Review Code 1- Verbalizes Understanding       Other: -Provides group and verbal instruction on various topics (see comments)   Knowledge Questionnaire Score:  Knowledge Questionnaire Score - 11/23/22 1541       Knowledge Questionnaire Score   Post Score 24/26             Core Components/Risk Factors/Patient Goals at Admission:  Personal Goals and Risk Factors at Admission - 09/26/22 1549       Core Components/Risk Factors/Patient Goals on Admission    Weight Management Weight Maintenance    Intervention Weight Management: Develop a combined nutrition and exercise program designed to reach desired caloric intake, while maintaining appropriate intake of nutrient and fiber, sodium and fats, and appropriate energy  expenditure required for the weight goal.;Weight Management: Provide education and appropriate resources to help participant  work on and attain dietary goals.;Weight Management/Obesity: Establish reasonable short term and long term weight goals.    Expected Outcomes Short Term: Continue to assess and modify interventions until short term weight is achieved;Long Term: Adherence to nutrition and physical activity/exercise program aimed toward attainment of established weight goal;Understanding recommendations for meals to include 15-35% energy as protein, 25-35% energy from fat, 35-60% energy from carbohydrates, less than 200mg  of dietary cholesterol, 20-35 gm of total fiber daily;Understanding of distribution of calorie intake throughout the day with the consumption of 4-5 meals/snacks;Weight Maintenance: Understanding of the daily nutrition guidelines, which includes 25-35% calories from fat, 7% or less cal from saturated fats, less than 200mg  cholesterol, less than 1.5gm of sodium, & 5 or more servings of fruits and vegetables daily    Diabetes Yes    Intervention Provide education about signs/symptoms and action to take for hypo/hyperglycemia.;Provide education about proper nutrition, including hydration, and aerobic/resistive exercise prescription along with prescribed medications to achieve blood glucose in normal ranges: Fasting glucose 65-99 mg/dL    Expected Outcomes Short Term: Participant verbalizes understanding of the signs/symptoms and immediate care of hyper/hypoglycemia, proper foot care and importance of medication, aerobic/resistive exercise and nutrition plan for blood glucose control.;Long Term: Attainment of HbA1C < 7%.    Hypertension Yes    Intervention Provide education on lifestyle modifcations including regular physical activity/exercise, weight management, moderate sodium restriction and increased consumption of fresh fruit, vegetables, and low fat dairy, alcohol moderation, and  smoking cessation.;Monitor prescription use compliance.    Expected Outcomes Short Term: Continued assessment and intervention until BP is < 140/65mm HG in hypertensive participants. < 130/47mm HG in hypertensive participants with diabetes, heart failure or chronic kidney disease.;Long Term: Maintenance of blood pressure at goal levels.    Lipids Yes    Intervention Provide education and support for participant on nutrition & aerobic/resistive exercise along with prescribed medications to achieve LDL 70mg , HDL >40mg .    Expected Outcomes Short Term: Participant states understanding of desired cholesterol values and is compliant with medications prescribed. Participant is following exercise prescription and nutrition guidelines.;Long Term: Cholesterol controlled with medications as prescribed, with individualized exercise RX and with personalized nutrition plan. Value goals: LDL < 70mg , HDL > 40 mg.             Education:Diabetes - Individual verbal and written instruction to review signs/symptoms of diabetes, desired ranges of glucose level fasting, after meals and with exercise. Acknowledge that pre and post exercise glucose checks will be done for 3 sessions at entry of program. Flowsheet Row Cardiac Rehab from 11/23/2022 in [facility] Cardiac and Pulmonary Rehab  Date 09/26/22  Educator [facility]  Instruction Review Code 1- Verbalizes Understanding       Core Components/Risk Factors/Patient Goals Review:   Goals and Risk Factor Review     Row Name 10/19/22 1600 11/14/22 1600           Core Components/Risk Factors/Patient Goals Review   Personal Goals Review Diabetes Diabetes      Review Yukta continues to do well in rehab. She has been sleeping late and struggles with low energy. Her blood glucose is managed by her insulin pump and states her readings have varied more than normal recently. She reports poor appetite despite trying to implement recommendations to incr snacks/meals to 3-5 per  day. She voices understanding of signs and symptoms of hypoglycemia and her husband also helps her manage. Her monitor will turn off insulin when readings become too low. Discussed plan  to increase protein shake intake when she has poor appetite to help manage glucose as well. Discussed trying to walk some after she wakes up in the mornings and to increase water intake that may help with her energy levels. Continue rehab to help improve energy as well. Giannamarie states she has been doing well with her diabetes. She checks her blood sugars at home regularly. She drinks a protien drink at night before bed to help stablize her blood sugar. She stats since she has added the protien shake her sugars have been better.      Expected Outcomes Short: Continue rehab and walking some at home to help improve energy; implementing nutrition goals to aid in better glucose management  Long:  Independantly manage risk factors Short: continue to monitor sugar. Long: maintain blood sugar reading independently               Core Components/Risk Factors/Patient Goals at Discharge (Final Review):   Goals and Risk Factor Review - 11/14/22 1600       Core Components/Risk Factors/Patient Goals Review   Personal Goals Review Diabetes    Review Zalena states she has been doing well with her diabetes. She checks her blood sugars at home regularly. She drinks a protien drink at night before bed to help stablize her blood sugar. She stats since she has added the protien shake her sugars have been better.    Expected Outcomes Short: continue to monitor sugar. Long: maintain blood sugar reading independently             ITP Comments:  ITP Comments     Row Name 09/15/22 1019 09/26/22 1527 09/29/22 1704 10/05/22 1216 11/02/22 1308   ITP Comments Virtual Visit completed. Patient informed on EP and RD appointment and 6 Minute walk test. Patient also informed of patient health questionnaires on My Chart. Patient Verbalizes  understanding. Visit diagnosis can be found in [facility] 08/23/2022. Completed and gym orientation. Initial ITP created and sent for review to Dr. Bethann Punches, Medical Director. First full day of exercise!  Patient was oriented to gym and equipment including functions, settings, policies, and procedures.  Patient's individual exercise prescription and treatment plan were reviewed.  All starting workloads were established based on the results of the 6 minute walk test done at initial orientation visit.  The plan for exercise progression was also introduced and progression will be customized based on patient's performance and goals. 30 Day review completed. Medical Director ITP review done, changes made as directed, and signed approval by Medical Director.    new to program 30 Day review completed. Medical Director ITP review done, changes made as directed, and signed approval by Medical Director.    Row Name 11/23/22 1155 12/05/22 1546         ITP Comments 30 Day review completed. Medical Director ITP review done, changes made as directed, and signed approval by Medical Director. Kassadee graduated today from  rehab with 36 sessions completed.  Details of the patient's exercise prescription and what She needs to do in order to continue the prescription and progress were discussed with patient.  Patient was given a copy of prescription and goals.  Patient verbalized understanding. Jenniferanne plans to continue to exercise by attending local gym.               Comments: Discharge ITP

## 2023-04-03 ENCOUNTER — Ambulatory Visit (INDEPENDENT_AMBULATORY_CARE_PROVIDER_SITE_OTHER): Payer: Medicare Other | Admitting: Dermatology

## 2023-04-03 DIAGNOSIS — Z85828 Personal history of other malignant neoplasm of skin: Secondary | ICD-10-CM

## 2023-04-03 DIAGNOSIS — L720 Epidermal cyst: Secondary | ICD-10-CM

## 2023-04-03 DIAGNOSIS — H61001 Unspecified perichondritis of right external ear: Secondary | ICD-10-CM

## 2023-04-03 DIAGNOSIS — L578 Other skin changes due to chronic exposure to nonionizing radiation: Secondary | ICD-10-CM | POA: Diagnosis not present

## 2023-04-03 DIAGNOSIS — L570 Actinic keratosis: Secondary | ICD-10-CM | POA: Diagnosis not present

## 2023-04-03 DIAGNOSIS — Z1283 Encounter for screening for malignant neoplasm of skin: Secondary | ICD-10-CM

## 2023-04-03 DIAGNOSIS — R238 Other skin changes: Secondary | ICD-10-CM

## 2023-04-03 DIAGNOSIS — D1801 Hemangioma of skin and subcutaneous tissue: Secondary | ICD-10-CM

## 2023-04-03 DIAGNOSIS — D2372 Other benign neoplasm of skin of left lower limb, including hip: Secondary | ICD-10-CM

## 2023-04-03 DIAGNOSIS — L821 Other seborrheic keratosis: Secondary | ICD-10-CM

## 2023-04-03 DIAGNOSIS — I8393 Asymptomatic varicose veins of bilateral lower extremities: Secondary | ICD-10-CM

## 2023-04-03 DIAGNOSIS — Z86018 Personal history of other benign neoplasm: Secondary | ICD-10-CM

## 2023-04-03 DIAGNOSIS — L905 Scar conditions and fibrosis of skin: Secondary | ICD-10-CM

## 2023-04-03 DIAGNOSIS — D2272 Melanocytic nevi of left lower limb, including hip: Secondary | ICD-10-CM

## 2023-04-03 DIAGNOSIS — L814 Other melanin hyperpigmentation: Secondary | ICD-10-CM

## 2023-04-03 DIAGNOSIS — D229 Melanocytic nevi, unspecified: Secondary | ICD-10-CM

## 2023-04-03 DIAGNOSIS — W908XXA Exposure to other nonionizing radiation, initial encounter: Secondary | ICD-10-CM

## 2023-04-03 DIAGNOSIS — L820 Inflamed seborrheic keratosis: Secondary | ICD-10-CM

## 2023-04-03 DIAGNOSIS — D239 Other benign neoplasm of skin, unspecified: Secondary | ICD-10-CM

## 2023-04-03 DIAGNOSIS — S80862A Insect bite (nonvenomous), left lower leg, initial encounter: Secondary | ICD-10-CM

## 2023-04-03 NOTE — Patient Instructions (Addendum)
 Recommend Serica moisturizing scar formula cream every night or [facility] brand or Mederma silicone scar sheet every night for the first year after a scar appears to help with scar remodeling if desired. Scars remodel on their own for a full year and will gradually improve in appearance over time.  Actinic keratoses are precancerous spots that appear secondary to cumulative UV radiation exposure/sun exposure over time. They are chronic with expected duration over 1 year. A portion of actinic keratoses will progress to squamous cell carcinoma of the skin. It is not possible to reliably predict which spots will progress to skin cancer and so treatment is recommended to prevent development of skin cancer.  Recommend daily broad spectrum sunscreen SPF 30+ to sun-exposed areas, reapply every 2 hours as needed.  Recommend staying in the shade or wearing long sleeves, sun glasses (UVA+UVB protection) and wide brim hats (4-inch brim around the entire circumference of the hat). Call for new or changing lesions.   Cryotherapy Aftercare  Wash gently with soap and water everyday.   Apply Vaseline and Band-Aid daily until healed.   Seborrheic Keratosis  What causes seborrheic keratoses? Seborrheic keratoses are harmless, common skin growths that first appear during adult life.  As time goes by, more growths appear.  Some people may develop a large number of them.  Seborrheic keratoses appear on both covered and uncovered body parts.  They are not caused by sunlight.  The tendency to develop seborrheic keratoses can be inherited.  They vary in color from skin-colored to gray, brown, or even black.  They can be either smooth or have a rough, warty surface.   Seborrheic keratoses are superficial and look as if they were stuck on the skin.  Under the microscope this type of keratosis looks like layers upon layers of skin.  That is why at times the top layer may seem to fall off, but the rest of the growth remains  and re-grows.    Treatment Seborrheic keratoses do not need to be treated, but can easily be removed in the office.  Seborrheic keratoses often cause symptoms when they rub on clothing or jewelry.  Lesions can be in the way of shaving.  If they become inflamed, they can cause itching, soreness, or burning.  Removal of a seborrheic keratosis can be accomplished by freezing, burning, or surgery. If any spot bleeds, scabs, or grows rapidly, please return to have it checked, as these can be an indication of a skin cancer.       Melanoma ABCDEs  Melanoma is the most dangerous type of skin cancer, and is the leading cause of death from skin disease.  You are more likely to develop melanoma if you: Have light-colored skin, light-colored eyes, or red or blond hair Spend a lot of time in the sun Tan regularly, either outdoors or in a tanning bed Have had blistering sunburns, especially during childhood Have a close family member who has had a melanoma Have atypical moles or large birthmarks  Early detection of melanoma is key since treatment is typically straightforward and cure rates are extremely high if we catch it early.   The first sign of melanoma is often a change in a mole or a new dark spot.  The ABCDE system is a way of remembering the signs of melanoma.  A for asymmetry:  The two halves do not match. B for border:  The edges of the growth are irregular. C for color:  A mixture of colors are  present instead of an even brown color. D for diameter:  Melanomas are usually (but not always) greater than 6mm - the size of a pencil eraser. E for evolution:  The spot keeps changing in size, shape, and color.  Please check your skin once per month between visits. You can use a small mirror in front and a large mirror behind you to keep an eye on the back side or your body.   If you see any new or changing lesions before your next follow-up, please call to schedule a visit.  Please continue  daily skin protection including broad spectrum sunscreen SPF 30+ to sun-exposed areas, reapplying every 2 hours as needed when you're outdoors.   Staying in the shade or wearing long sleeves, sun glasses (UVA+UVB protection) and wide brim hats (4-inch brim around the entire circumference of the hat) are also recommended for sun protection.     Due to recent changes in healthcare laws, you may see results of your pathology and/or laboratory studies on MyChart before the doctors have had a chance to review them. We understand that in some cases there may be results that are confusing or concerning to you. Please understand that not all results are received at the same time and often the doctors may need to interpret multiple results in order to provide you with the best plan of care or course of treatment. Therefore, we ask that you please give [place] 2 business days to thoroughly review all your results before contacting the office for clarification. Should we see a critical lab result, you will be contacted sooner.   If You Need Anything After Your Visit  If you have any questions or concerns for your doctor, please call our main line at [phone] and press option 4 to reach your doctor's medical assistant. If no one answers, please leave a voicemail as directed and we will return your call as soon as possible. Messages left after 4 pm will be answered the following business day.   You may also send [place] a message via MyChart. We typically respond to MyChart messages within 1-2 business days.  For prescription refills, please ask your pharmacy to contact our office. Our fax number is [phone].  If you have an urgent issue when the clinic is closed that cannot wait until the next business day, you can page your doctor at the number below.    Please note that while we do our best to be available for urgent issues outside of office hours, we are not available 24/7.   If you have an urgent issue and  are unable to reach [place], you may choose to seek medical care at your doctor's office, retail clinic, urgent care center, or emergency room.  If you have a medical emergency, please immediately call 911 or go to the emergency department.  Pager Numbers  - Dr. Gwen Pounds: [phone]  - Dr. Roseanne Reno: [phone]  - Dr. Katrinka Blazing: [phone]   In the event of inclement weather, please call our main line at [phone] for an update on the status of any delays or closures.  Dermatology Medication Tips: Please keep the boxes that topical medications come in in order to help keep track of the instructions about where and how to use these. Pharmacies typically print the medication instructions only on the boxes and not directly on the medication tubes.   If your medication is too expensive, please contact our office at [phone] option 4 or send [place] a message through  MyChart.   We are unable to tell what your co-pay for medications will be in advance as this is different depending on your insurance coverage. However, we may be able to find a substitute medication at lower cost or fill out paperwork to get insurance to cover a needed medication.   If a prior authorization is required to get your medication covered by your insurance company, please allow [place] 1-2 business days to complete this process.  Drug prices often vary depending on where the prescription is filled and some pharmacies may offer cheaper prices.  The website www.goodrx.com contains coupons for medications through different pharmacies. The prices here do not account for what the cost may be with help from insurance (it may be cheaper with your insurance), but the website can give you the price if you did not use any insurance.  - You can print the associated coupon and take it with your prescription to the pharmacy.  - You may also stop by our office during regular business hours and pick up a GoodRx coupon card.  - If you need  your prescription sent electronically to a different pharmacy, notify our office through [facility] or by phone at [phone] option 4.     Si Usted Necesita Algo Despus de Su Visita  Tambin puede enviarnos un mensaje a travs de Clinical cytogeneticist. Por lo general respondemos a los mensajes de MyChart en el transcurso de 1 a 2 das hbiles.  Para renovar recetas, por favor pida a su farmacia que se ponga en contacto con nuestra oficina. Annie Sable de fax es [place] [phone].  Si tiene un asunto urgente cuando la clnica est cerrada y que no puede esperar hasta el siguiente da hbil, puede llamar/localizar a su doctor(a) al nmero que aparece a continuacin.   Por favor, tenga en cuenta que aunque hacemos todo lo posible para estar disponibles para asuntos urgentes fuera del horario de [place], no estamos disponibles las 24 horas del da, los 7 [address]  [address] de la [place].   Si tiene un problema urgente y no puede comunicarse con nosotros, puede optar por buscar atencin mdica  en el consultorio de su doctor(a), en una clnica privada, en un centro de atencin urgente o en una sala de emergencias.  Si tiene Engineer, drilling, por favor llame inmediatamente al 911 o vaya a la sala de emergencias.  Nmeros de bper  - Dr. Gwen Pounds: [phone]  - Dra. Roseanne Reno: [phone]  - Dr. Katrinka Blazing: [phone]   En caso de inclemencias del tiempo, por favor llame a Lacy Duverney principal al [phone] para una actualizacin sobre el [place] de cualquier retraso o cierre.  Consejos para la medicacin en dermatologa: Por favor, guarde las cajas en las que vienen los medicamentos de uso tpico para ayudarle a seguir las instrucciones sobre dnde y cmo usarlos. Las farmacias generalmente imprimen las instrucciones del medicamento slo en las cajas y no directamente en los tubos del [place].   Si su medicamento es muy caro, por favor, pngase en contacto con Rolm Gala llamando al  [phone] y presione la opcin 4 o envenos un mensaje a travs de Clinical cytogeneticist.   No podemos decirle cul ser su copago por los medicamentos por adelantado ya que esto es diferente dependiendo de la cobertura de su seguro. Sin embargo, es posible que podamos encontrar un medicamento sustituto a Audiological scientist un formulario para que el seguro cubra el medicamento que se considera necesario.   Si se requiere Futures trader  previa para que su compaa de seguros [place] su medicamento, por favor permtanos de 1 a 2 das hbiles para completar [address]  Los precios de los medicamentos varan con frecuencia dependiendo del Environmental consultant de dnde se surte la receta y alguna farmacias pueden ofrecer precios ms baratos.  El sitio web www.goodrx.com tiene cupones para medicamentos de Health and safety inspector. Los precios aqu no tienen en cuenta lo que podra costar con la ayuda del seguro (puede ser ms barato con su seguro), pero el sitio web puede darle el precio si no utiliz Tourist information centre manager.  - Puede imprimir el cupn correspondiente y llevarlo con su receta a la farmacia.  - Tambin puede pasar por nuestra oficina durante el horario de atencin regular y Education officer, museum una tarjeta de cupones de GoodRx.  - Si necesita que su receta se enve electrnicamente a una farmacia diferente, informe a nuestra oficina a travs de MyChart de [place] o por telfono llamando al [phone] y presione la opcin 4.

## 2023-04-03 NOTE — Progress Notes (Signed)
 Follow-Up Visit   Subjective  Cassandra Joyce is a 78 y.o. female who presents for the following: Skin Cancer Screening and Full Body Skin Exam Hx of bcc, Hx of dysplastic nevi  The patient presents for Total-Body Skin Exam (TBSE) for skin cancer screening and mole check. The patient has spots, moles and lesions to be evaluated, some may be new or changing and the patient may have concern these could be cancer.  She has itchy spots at back at braline.   The following portions of the chart were reviewed this encounter and updated as appropriate: medications, allergies, medical history  Review of Systems:  No other skin or systemic complaints except as noted in HPI or Assessment and Plan.  Objective  Well appearing patient in no apparent distress; mood and affect are within normal limits.  A full examination was performed including scalp, head, eyes, ears, nose, lips, neck, chest, axillae, abdomen, back, buttocks, bilateral upper extremities, bilateral lower extremities, hands, feet, fingers, toes, fingernails, and toenails. All findings within normal limits unless otherwise noted below.   Relevant physical exam findings are noted in the Assessment and Plan.      right upper eyebrow x 1, spinal upper back x 1 Erythematous thin papules/macules with gritty scale.  left mid back at braline x 2 (2) Erythematous stuck-on, waxy papule or plaque  Assessment & Plan   SKIN CANCER SCREENING PERFORMED TODAY.  ACTINIC DAMAGE - Chronic condition, secondary to cumulative UV/sun exposure - diffuse scaly erythematous macules with underlying dyspigmentation - Recommend daily broad spectrum sunscreen SPF 30+ to sun-exposed areas, reapply every 2 hours as needed.  - Staying in the shade or wearing long sleeves, sun glasses (UVA+UVB protection) and wide brim hats (4-inch brim around the entire circumference of the hat) are also recommended for sun protection.  - Call for new or changing  lesions.  MILIA Exam: tiny erythematous firm white papules at face  Treatment Plan: Discussed this is a type of cyst. Benign-appearing. Observe.   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  Benign-appearing.  Observation.  Call clinic for new or changing lesions.  Recommend daily use of broad spectrum spf 30+ sunscreen to sun-exposed areas.   Will recheck on f/up. If grows or changes, patient advised to let us know   LENTIGINES, SEBORRHEIC KERATOSES, HEMANGIOMAS - Benign normal skin lesions - Benign-appearing - Call for any changes  MELANOCYTIC NEVI - 4 mm flesh papule at left plantar foot at ball  - Tan-brown and/or pink-flesh-colored symmetric macules and papules - Benign appearing on exam today - Observation - Call clinic for new or changing moles - Recommend daily use of broad spectrum spf 30+ sunscreen to sun-exposed areas.    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  Observation.  Call clinic for new or changing lesions. Recommend daily broad spectrum sunscreen SPF 30+, reapply every 2 hours as needed.  Treatment:  Recommend Serica moisturizing scar formula cream every night or [facility] brand or Mederma silicone scar sheet every night for the first year after a scar appears to help with scar remodeling if desired. Scars remodel on their own for a full year and will gradually improve in appearance over time.  Also discussed ilk injection to thickened portion  Insect Bite Reaction at left popliteal  Exam: pink edematous papule with central crust, pt states itches   Treatment Plan: Benign. Observe    DERMATOFIBROMA Exam: Firm pink/brown papulenodule with dimple sign. At left anterior ankle   Treatment Plan: A dermatofibroma is a  benign growth possibly related to trauma, such as an insect bite, cut from shaving, or inflamed acne-type bump.  Treatment options to remove include shave or excision with resulting scar and risk of recurrence.  Since benign-appearing and not bothersome, will observe for now.   Varicose Veins/Spider Veins - Dilated blue, purple or red veins at the lower extremities - Reassured - Smaller vessels can be treated by sclerotherapy (a procedure to inject a medicine into the veins to make them disappear) if desired, but the treatment is not covered by insurance. Larger vessels may be covered if symptomatic and we would refer to vascular surgeon if treatment desired.  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  - Call if any new or changing lesions are noted between office visits  ACTINIC KERATOSIS right upper eyebrow x 1, spinal upper back x 1 Vs ISK at right upper eyebrow and spinal upper back  Actinic keratoses are precancerous spots that appear secondary to cumulative UV radiation exposure/sun exposure over time. They are chronic with expected duration over 1 year. A portion of actinic keratoses will progress to squamous cell carcinoma of the skin. It is not possible to reliably predict which spots will progress to skin cancer and so treatment is recommended to prevent development of skin cancer.  Recommend daily broad spectrum sunscreen SPF  30+ to sun-exposed areas, reapply every 2 hours as needed.  Recommend staying in the shade or wearing long sleeves, sun glasses (UVA+UVB protection) and wide brim hats (4-inch brim around the entire circumference of the hat). Call for new or changing lesions. Destruction of lesion - right upper eyebrow x 1, spinal upper back x 1  Destruction method: cryotherapy   Informed consent: discussed and consent obtained   Lesion destroyed using liquid nitrogen: Yes   Region frozen until ice ball extended beyond lesion: Yes   Outcome: patient tolerated procedure well with no complications   Post-procedure details: wound care instructions given   Additional details:  Prior to procedure, discussed risks of blister formation, small wound, skin dyspigmentation, or rare scar following cryotherapy. Recommend Vaseline ointment to treated areas while healing.  INFLAMED SEBORRHEIC KERATOSIS (2) left mid back at braline x 2 (2) Symptomatic, irritating, patient would like treated. Destruction of lesion - left mid back at braline x 2 (2)  Destruction method: cryotherapy   Informed consent: discussed and consent obtained   Lesion destroyed using liquid nitrogen: Yes   Region frozen until ice ball extended beyond lesion: Yes   Outcome: patient tolerated procedure well with no complications   Post-procedure details: wound care instructions given   Additional details:  Prior to procedure, discussed risks of blister formation, small wound, skin dyspigmentation, or rare scar following  cryotherapy. Recommend Vaseline ointment to treated areas while healing.  Return in about 6 months (around 10/03/2023) for ak follow up and recheck right ear , 1 year tbse .  I, Asher Muir, CMA, am acting as scribe for Willeen Niece, MD.   Documentation: I have reviewed the above documentation for accuracy and completeness, and I agree with the above.  Willeen Niece, MD

## 2023-06-26 ENCOUNTER — Other Ambulatory Visit: Payer: Self-pay

## 2023-06-26 ENCOUNTER — Emergency Department
Admission: EM | Admit: 2023-06-26 | Discharge: 2023-06-26 | Disposition: A | Discharge status: Home / Self Care | Attending: Emergency Medicine | Admitting: Emergency Medicine

## 2023-06-26 ENCOUNTER — Emergency Department

## 2023-06-26 DIAGNOSIS — E876 Hypokalemia: Secondary | ICD-10-CM | POA: Diagnosis not present

## 2023-06-26 DIAGNOSIS — H6691 Otitis media, unspecified, right ear: Secondary | ICD-10-CM | POA: Diagnosis not present

## 2023-06-26 DIAGNOSIS — K529 Noninfective gastroenteritis and colitis, unspecified: Secondary | ICD-10-CM | POA: Insufficient documentation

## 2023-06-26 DIAGNOSIS — R1110 Vomiting, unspecified: Secondary | ICD-10-CM | POA: Diagnosis present

## 2023-06-26 DIAGNOSIS — E119 Type 2 diabetes mellitus without complications: Secondary | ICD-10-CM | POA: Insufficient documentation

## 2023-06-26 DIAGNOSIS — H6690 Otitis media, unspecified, unspecified ear: Secondary | ICD-10-CM

## 2023-06-26 DIAGNOSIS — R519 Headache, unspecified: Secondary | ICD-10-CM | POA: Diagnosis present

## 2023-06-26 DIAGNOSIS — I10 Essential (primary) hypertension: Secondary | ICD-10-CM | POA: Insufficient documentation

## 2023-06-26 LAB — COMPREHENSIVE METABOLIC PANEL WITH GFR
ALT: 37 U/L (ref 0–44)
AST: 45 U/L — ABNORMAL HIGH (ref 15–41)
Albumin: 3.5 g/dL (ref 3.5–5.0)
Alkaline Phosphatase: 54 U/L (ref 38–126)
Anion gap: 8 (ref 5–15)
BUN: 35 mg/dL — ABNORMAL HIGH (ref 8–23)
CO2: 20 mmol/L — ABNORMAL LOW (ref 22–32)
Calcium: 9.3 mg/dL (ref 8.9–10.3)
Chloride: 108 mmol/L (ref 98–111)
Creatinine, Ser: 1.09 mg/dL — ABNORMAL HIGH (ref 0.44–1.00)
GFR, Estimated: 51 mL/min — ABNORMAL LOW (ref >60)
Glucose, Bld: 311 mg/dL — ABNORMAL HIGH (ref 70–99)
Potassium: 3.4 mmol/L — ABNORMAL LOW (ref 3.5–5.1)
Sodium: 136 mmol/L (ref 135–145)
Total Bilirubin: 0.9 mg/dL (ref 0.0–1.2)
Total Protein: 5.8 g/dL — ABNORMAL LOW (ref 6.5–8.1)

## 2023-06-26 LAB — CBC WITH DIFFERENTIAL/PLATELET
Abs Immature Granulocytes: 0.02 10*3/uL (ref 0.00–0.07)
Basophils Absolute: 0 10*3/uL (ref 0.0–0.1)
Basophils Relative: 0 %
Eosinophils Absolute: 0 10*3/uL (ref 0.0–0.5)
Eosinophils Relative: 0 %
HCT: 35.3 % — ABNORMAL LOW (ref 36.0–46.0)
Hemoglobin: 11.4 g/dL — ABNORMAL LOW (ref 12.0–15.0)
Immature Granulocytes: 0 %
Lymphocytes Relative: 2 %
Lymphs Abs: 0.2 10*3/uL — ABNORMAL LOW (ref 0.7–4.0)
MCH: 28.9 pg (ref 26.0–34.0)
MCHC: 32.3 g/dL (ref 30.0–36.0)
MCV: 89.6 fL (ref 80.0–100.0)
Monocytes Absolute: 0.4 10*3/uL (ref 0.1–1.0)
Monocytes Relative: 5 %
Neutro Abs: 7.2 10*3/uL (ref 1.7–7.7)
Neutrophils Relative %: 93 %
Platelets: 122 10*3/uL — ABNORMAL LOW (ref 150–400)
RBC: 3.94 MIL/uL (ref 3.87–5.11)
RDW: 14.6 % (ref 11.5–15.5)
WBC: 7.8 10*3/uL (ref 4.0–10.5)
nRBC: 0 % (ref 0.0–0.2)

## 2023-06-26 LAB — GASTROINTESTINAL PANEL BY PCR, STOOL (REPLACES STOOL CULTURE)

## 2023-06-26 LAB — MAGNESIUM: Magnesium: 1.8 mg/dL (ref 1.7–2.4)

## 2023-06-26 LAB — LIPASE, BLOOD: Lipase: 36 U/L (ref 11–51)

## 2023-06-26 MED ORDER — CEFDINIR 300 MG PO CAPS: 300.0000 mg | ORAL_CAPSULE | Freq: Two times a day (BID) | ORAL | 0 refills | Status: AC

## 2023-06-26 MED ORDER — PROCHLORPERAZINE EDISYLATE 10 MG/2ML IJ SOLN
5.0000 mg | Freq: Once | INTRAMUSCULAR | Status: AC
  Administered 2023-06-26: 5 mg via INTRAVENOUS
  Filled 2023-06-26: qty 2

## 2023-06-26 MED ORDER — ONDANSETRON HCL 4 MG/2ML IJ SOLN
4.0000 mg | Freq: Once | INTRAMUSCULAR | Status: AC
  Administered 2023-06-26: 4 mg via INTRAVENOUS
  Filled 2023-06-26: qty 2

## 2023-06-26 MED ORDER — LACTATED RINGERS IV BOLUS
1000.0000 mL | Freq: Once | INTRAVENOUS | Status: AC
  Administered 2023-06-26: 1000 mL via INTRAVENOUS

## 2023-06-26 MED ORDER — IOHEXOL 300 MG/ML  SOLN
100.0000 mL | Freq: Once | INTRAMUSCULAR | Status: AC | PRN
Start: 2023-06-26 — End: 2023-06-26
  Administered 2023-06-26: 100 mL via INTRAVENOUS

## 2023-06-26 NOTE — ED Notes (Signed)
 Pt given [place] ice and crackers. Pt tolerated well. Pt still having continuous diarrhea, sample sent off

## 2023-06-26 NOTE — ED Triage Notes (Signed)
 BIBEMS, coming from home. Since April intermittent R ear pain that causes HA's. N/V/D that started this AM, with HA. Ambulatory with triage. VSS. BGL: 273. GCS 15. 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

## 2023-06-26 NOTE — Inpatient Diabetes Management (Signed)
 Inpatient Diabetes Program Recommendations  AACE/ADA: New Consensus Statement on Inpatient Glycemic Control (2015)  Target Ranges:  Prepandial:   less than 140 mg/dL      Peak postprandial:   less than 180 mg/dL (1-2 hours)      Critically ill patients:  140 - 180 mg/dL    Latest Reference Range & Units 06/26/23 08:38  Sodium 135 - 145 mmol/L 136  Potassium 3.5 - 5.1 mmol/L 3.4 (L)  Chloride 98 - 111 mmol/L 108  CO2 22 - 32 mmol/L 20 (L)  Glucose 70 - 99 mg/dL 688 (H)  BUN 8 - 23 mg/dL 35 (H)  Creatinine 9.55 - 1.00 mg/dL 8.90 (H)  Calcium  8.9 - 10.3 mg/dL 9.3  Anion gap 5 - 15  8       To ED with R posterior HA with N/V/D since waking up this morning.  History: Type 1 diabetes  Home DM Meds: Insulin  Pump (Tslim)     6/23- Lab Glu 311 (AG 8/ CO2 20) Given 1L LR IVF bolus in the ED  Secure Chat sent to RN caring for pt in the ED--Per RN, pt is wearing her insulin  pump.  I asked RN to please obtain current CBG as of 2:15pm.    --If pt admitted and pt is A&O and able to independently manage her insulin  pump, please place insulin  pump orders  --If pt admitted and pt is unable to independently manage her insulin  pump (or if pump is accidentally removed), pt will need orders for both Semglee and Novolog  insulins: Semglee 8 units daily + Novolog  0-6 units Q4 hours     ENDO: Dr. Cherilyn Last Seen 06/08/2023 Total Basal: 8.388 units/ I:C 15/ CF 70/ Target 120  A1c at last ENDO visit was 7.5%      --Will follow patient during hospitalization--  Adina Rudolpho Arrow RN, MSN, CDCES Diabetes Coordinator Inpatient Glycemic Control Team Team Pager: [phone] (8a-5p)

## 2023-06-26 NOTE — ED Provider Notes (Signed)
 [facility] Provider Note    Event Date/Time   First MD Initiated Contact with Patient 06/26/23 0830     (approximate)   History   Chief Complaint Headache and Emesis   HPI  Cassandra Joyce is a 78 y.o. female with past medical history of hypertension, hyperlipidemia, diabetes, and GERD who presents to the ED complaining of headache and vomiting.  Patient reports that she has been dealing with intermittent headaches behind her right ear for the past couple of months, but headache seemed acutely worse when she woke up this morning.  She has not noticed any pain or swelling to this area, denies any fevers or pain inside the ear itself.  She has been having nausea and dry heaves since waking up this morning associated with diarrhea.  She denies any abdominal pain, dysuria, or flank pain.  She denies any cough, chest pain, or shortness of breath.     Physical Exam   Triage Vital Signs: ED Triage Vitals  Encounter Vitals Group     BP      Girls Systolic BP Percentile      Girls Diastolic BP Percentile      Boys Systolic BP Percentile      Boys Diastolic BP Percentile      Pulse      Resp      Temp      Temp src      SpO2      Weight      Height      Head Circumference      Peak Flow      Pain Score      Pain Loc      Pain Education      Exclude from Growth Chart     Most recent vital signs: Vitals:   06/26/23 1000 06/26/23 1015  BP: (!) 130/51   Pulse: 87   Resp: (!) 30 (!) 26  Temp:    SpO2: 97%     Constitutional: Alert and oriented. Eyes: Conjunctivae are normal. Head: Atraumatic. Ears: Right TM with some bulging and erythema, no tenderness of the exterior ear.  No mastoid tenderness, erythema, or edema. Nose: No congestion/rhinnorhea. Mouth/Throat: Mucous membranes are moist.  Cardiovascular: Normal rate, regular rhythm. Grossly normal heart sounds.  2+ radial pulses bilaterally. Respiratory: Normal respiratory effort.  No  retractions. Lungs CTAB. Gastrointestinal: Soft and nontender. No distention. Musculoskeletal: No lower extremity tenderness nor edema.  Neurologic:  Normal speech and language. No gross focal neurologic deficits are appreciated.    ED Results / Procedures / Treatments   Labs (all labs ordered are listed, but only abnormal results are displayed) Labs Reviewed  COMPREHENSIVE METABOLIC PANEL WITH GFR - Abnormal; Notable for the following components:      Result Value   Potassium 3.4 (*)    CO2 20 (*)    Glucose, Bld 311 (*)    BUN 35 (*)    Creatinine, Ser 1.09 (*)    Total Protein 5.8 (*)    AST 45 (*)    GFR, Estimated 51 (*)    All other components within normal limits  CBC WITH DIFFERENTIAL/PLATELET - Abnormal; Notable for the following components:   Hemoglobin 11.4 (*)    HCT 35.3 (*)    Platelets 122 (*)    Lymphs Abs 0.2 (*)    All other components within normal limits  GASTROINTESTINAL PANEL BY PCR, STOOL (REPLACES STOOL CULTURE)  LIPASE, BLOOD  MAGNESIUM   CBC WITH DIFFERENTIAL/PLATELET  URINALYSIS, ROUTINE W REFLEX MICROSCOPIC    ED ECG REPORT I, Carlin Palin, the attending physician, personally viewed and interpreted this ECG.   Date: 06/26/2023  EKG Time: 8:46  Rate: 88  Rhythm: normal sinus rhythm  Axis: Normal  Intervals:Prolonged QT  ST&T Change: None   RADIOLOGY CT head reviewed and interpreted by me with no hemorrhage or midline shift.  PROCEDURES:  Critical Care performed: No  Procedures   MEDICATIONS ORDERED IN ED: Medications  lactated ringers  bolus 1,000 mL (0 mLs Intravenous Stopped 06/26/23 1233)  ondansetron  (ZOFRAN ) injection 4 mg (4 mg Intravenous Given 06/26/23 0849)  lactated ringers  bolus 1,000 mL (0 mLs Intravenous Stopped 06/26/23 1346)  prochlorperazine (COMPAZINE) injection 5 mg (5 mg Intravenous Given 06/26/23 1107)  iohexol  (OMNIPAQUE ) 300 MG/ML solution 100 mL (100 mLs Intravenous Contrast Given 06/26/23 1132)      IMPRESSION / MDM / ASSESSMENT AND PLAN / ED COURSE  I reviewed the triage vital signs and the nursing notes.                              78 y.o. female with past medical history of hypertension, hyperlipidemia, diabetes, and GERD who presents to the ED complaining of right posterior headache with nausea, vomiting, and diarrhea since waking up this morning.  Patient's presentation is most consistent with acute presentation with potential threat to life or bodily function.  Differential diagnosis includes, but is not limited to, SAH, meningitis, mastoiditis, tension headache, migraine headache, gastroenteritis, dehydration, electrolyte abnormality, AKI, UTI.  Patient nontoxic-appearing and in no acute distress, vital signs are unremarkable.  She denies abdominal pain and has a benign abdominal exam, GI symptoms sound most consistent with a gastroenteritis.  We will treat with IV Zofran  and hydrate with IV fluids, lab results are pending at this time.  She also reports acute on chronic pain behind her right ear, where exam is unremarkable.  We will check CT head but no focal neurologic deficits noted on exam.  CT head is negative for acute intracranial process, does show some fluid in the area of the mastoid process.  Patient's husband is now at bedside, states she has been complaining of mild pain around her right mastoid process for the past week, got acutely worse overnight.  She also continues to feel nauseous despite IV Zofran , EKG concerning for prolonged QT and we will treat with IV Compazine.  Labs with mild AKI and hyperglycemia but no evidence of DKA, LFTs are unremarkable.  No significant anemia or leukocytosis noted.  CT abdomen/pelvis is negative for acute process, CT of temporal bones shows moderate fluid in the right mastoid but no other findings concerning for mastoiditis.  CT imaging reviewed by Dr. Rumalda of ENT, who agrees that mastoiditis is very unlikely, recommends treatment  for ear infection and outpatient follow-up.  She is mildly tachypneic but denies any difficulty breathing, chest x-ray shows air in her stomach and esophagus consistent with recent vomiting.  No cardiopulmonary abnormality noted.  On reassessment, patient reports feeling much better and is tolerating oral intake without difficulty.  She is appropriate for discharge home with outpatient follow-up, was counseled to return to the ED for new or worsening symptoms.  Patient and spouse agree with plan.      FINAL CLINICAL IMPRESSION(S) / ED DIAGNOSES   Final diagnoses:  Gastroenteritis  Acute otitis media, unspecified otitis media type  Rx / DC Orders   ED Discharge Orders          Ordered    cefdinir (OMNICEF) 300 MG capsule  2 times daily        06/26/23 1443             Note:  This document was prepared using Dragon voice recognition software and may include unintentional dictation errors.   Willo Dunnings, MD 06/26/23 1452

## 2023-06-28 ENCOUNTER — Other Ambulatory Visit: Payer: Self-pay | Admitting: Infectious Diseases

## 2023-06-28 DIAGNOSIS — Z1231 Encounter for screening mammogram for malignant neoplasm of breast: Secondary | ICD-10-CM

## 2023-08-14 ENCOUNTER — Ambulatory Visit
Admission: RE | Admit: 2023-08-14 | Discharge: 2023-08-14 | Disposition: A | Discharge status: Home / Self Care | Source: Ambulatory Visit | Attending: Infectious Diseases | Admitting: Infectious Diseases

## 2023-08-14 DIAGNOSIS — Z1231 Encounter for screening mammogram for malignant neoplasm of breast: Secondary | ICD-10-CM | POA: Insufficient documentation

## 2023-10-09 ENCOUNTER — Ambulatory Visit: Admitting: Dermatology

## 2023-10-09 ENCOUNTER — Encounter: Payer: Self-pay | Admitting: Dermatology

## 2023-10-09 DIAGNOSIS — D492 Neoplasm of unspecified behavior of bone, soft tissue, and skin: Secondary | ICD-10-CM

## 2023-10-09 DIAGNOSIS — L820 Inflamed seborrheic keratosis: Secondary | ICD-10-CM | POA: Diagnosis not present

## 2023-10-09 DIAGNOSIS — L814 Other melanin hyperpigmentation: Secondary | ICD-10-CM | POA: Diagnosis not present

## 2023-10-09 DIAGNOSIS — D2361 Other benign neoplasm of skin of right upper limb, including shoulder: Secondary | ICD-10-CM | POA: Diagnosis not present

## 2023-10-09 DIAGNOSIS — H61001 Unspecified perichondritis of right external ear: Secondary | ICD-10-CM

## 2023-10-09 DIAGNOSIS — R238 Other skin changes: Secondary | ICD-10-CM | POA: Diagnosis not present

## 2023-10-09 DIAGNOSIS — Z85828 Personal history of other malignant neoplasm of skin: Secondary | ICD-10-CM | POA: Diagnosis not present

## 2023-10-09 DIAGNOSIS — Z872 Personal history of diseases of the skin and subcutaneous tissue: Secondary | ICD-10-CM

## 2023-10-09 NOTE — Patient Instructions (Addendum)

## 2023-10-09 NOTE — Progress Notes (Signed)
 Follow-Up Visit   Subjective  Cassandra Joyce is a 78 y.o. female who presents for the following: AK 31m f/u R upper eyebrow, spinal upper back, CNCH vs Cyst R upper ear helix recheck, check spot L chest, no symptoms, check spot R arm picks at, check growth R ant thigh, keeps scabbing over   The following portions of the chart were reviewed this encounter and updated as appropriate: medications, allergies, medical history  Review of Systems:  No other skin or systemic complaints except as noted in HPI or Assessment and Plan.  Objective  Well appearing patient in no apparent distress; mood and affect are within normal limits.   A focused examination was performed of the following areas: Face, ears, back, arms, right leg  Relevant exam findings are noted in the Assessment and Plan.  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 Stable compared to previous visit. Observation.  Call clinic for new or changing moles.  Recommend daily use of broad spectrum spf 30+ sunscreen to sun-exposed areas.    HISTORY OF BASAL CELL CARCINOMA OF THE SKIN - No evidence of recurrence today- L nasal root - Recommend regular full body skin exams - Recommend daily broad spectrum sunscreen SPF 30+ to sun-exposed areas, reapply every 2 hours as needed.  - Call if any new or changing lesions are noted between office visits    HISTORY OF PRECANCEROUS ACTINIC KERATOSIS - site(s) of PreCancerous Actinic Keratosis clear today. - these may recur and new lesions may form requiring treatment to prevent transformation into skin cancer - observe for new or changing spots and contact [facility] for appointment if occur - photoprotection with sun protective clothing; sunglasses  and broad spectrum sunscreen with SPF of at least 30 + and frequent self skin exams recommended - yearly exams by a dermatologist recommended for persons with history of PreCancerous Actinic Keratoses   LENTIGINES arms Exam: scattered tan macules Due to sun exposure Treatment Plan: Benign-appearing, observe. Recommend daily broad spectrum sunscreen SPF 30+ to sun-exposed areas, reapply every 2 hours as needed.  Call for any changes  INFLAMED SEBORRHEIC KERATOSIS (2) L upper clavicle x 1, R ant thigh x 1 (2) Symptomatic, irritating, patient would like treated. Destruction of lesion - L upper clavicle x 1, R ant thigh x 1 (2)  Destruction method: cryotherapy   Informed consent: discussed and consent obtained   Lesion destroyed using liquid nitrogen: Yes   Region frozen until ice ball extended beyond lesion: Yes   Outcome: patient tolerated procedure well with no complications   Post-procedure details: wound care instructions given   Additional details:  Prior to procedure, discussed risks of blister formation, small wound, skin dyspigmentation, or rare scar following cryotherapy. Recommend Vaseline ointment to treated areas while healing.   NEOPLASM OF SKIN R upper arm Epidermal / dermal shaving  Lesion diameter (cm):  0.6 Informed consent: discussed and consent obtained   Patient was prepped and draped in usual sterile fashion: area prepped with alcohol. Anesthesia: the lesion was anesthetized in a standard fashion   Anesthetic:  1% lidocaine  w/ epinephrine  1-100,000 buffered w/ 8.4% NaHCO3 Instrument used: flexible razor blade   Hemostasis achieved with: pressure, aluminum chloride and electrodesiccation   Outcome:  patient tolerated procedure well   Post-procedure details: wound care instructions given   Post-procedure details comment:  Ointment and small bandage applied  Specimen 1 - Surgical pathology Differential Diagnosis: ISK vs Cyst vs Dermatofibroma  Check Margins:  yes 5.14mm firm excoriated pap HISTORY OF BASAL CELL CARCINOMA (BCC) OF SKIN   HISTORY OF ACTINIC KERATOSIS   LENTIGO    Return in about 6 months (around 04/08/2024) for TBSE, Hx of BCC, Hx of AKs, Hx of Dysplastic nevi.  I, Cassandra Joyce, RMA, am acting as scribe for Cassandra Rattler, MD .   Documentation: I have reviewed the above documentation for accuracy and completeness, and I agree with the above.  Cassandra Rattler, MD

## 2023-10-10 ENCOUNTER — Ambulatory Visit: Payer: Self-pay | Admitting: Dermatology

## 2023-10-10 LAB — SURGICAL PATHOLOGY

## 2023-10-11 NOTE — Telephone Encounter (Signed)
-----   Message from [name] sent at 10/10/2023  8:07 PM EDT ----- 1. Skin, R upper arm :       SURFACE OF A DERMATOFIBROMA   Benign, may recur - please call patient  A dermatofibroma is a benign growth possibly related to trauma, such as an insect bite, cut from shaving, or inflamed acne-type bump.  ----- Message ----- From: Interface, Lab In Three Zero One Sent: 10/10/2023   5:31 PM EDT To: [name], MD

## 2023-10-11 NOTE — Telephone Encounter (Signed)
 Left pt msg to call for bx results/sh

## 2023-10-12 NOTE — Telephone Encounter (Signed)
 Advised pt of bx results/sh ?

## 2023-11-15 ENCOUNTER — Inpatient Hospital Stay

## 2023-11-15 ENCOUNTER — Inpatient Hospital Stay: Attending: Oncology | Admitting: Oncology

## 2023-11-15 ENCOUNTER — Ambulatory Visit: Payer: Self-pay | Admitting: Oncology

## 2023-11-15 ENCOUNTER — Encounter: Payer: Self-pay | Admitting: Oncology

## 2023-11-15 VITALS — BP 109/65 | HR 70 | Temp 96.5°F | Resp 18 | Ht 66.0 in | Wt 126.1 lb

## 2023-11-15 DIAGNOSIS — D649 Anemia, unspecified: Secondary | ICD-10-CM | POA: Diagnosis not present

## 2023-11-15 DIAGNOSIS — I10 Essential (primary) hypertension: Secondary | ICD-10-CM | POA: Diagnosis not present

## 2023-11-15 DIAGNOSIS — Z79899 Other long term (current) drug therapy: Secondary | ICD-10-CM | POA: Diagnosis not present

## 2023-11-15 DIAGNOSIS — E109 Type 1 diabetes mellitus without complications: Secondary | ICD-10-CM | POA: Insufficient documentation

## 2023-11-15 LAB — CBC WITH DIFFERENTIAL/PLATELET
Abs Immature Granulocytes: 0.01 K/uL (ref 0.00–0.07)
Basophils Absolute: 0 K/uL (ref 0.0–0.1)
Basophils Relative: 0 %
Eosinophils Absolute: 0.1 K/uL (ref 0.0–0.5)
Eosinophils Relative: 2 %
HCT: 34.5 % — ABNORMAL LOW (ref 36.0–46.0)
Hemoglobin: 11.1 g/dL — ABNORMAL LOW (ref 12.0–15.0)
Immature Granulocytes: 0 %
Lymphocytes Relative: 20 %
Lymphs Abs: 1.2 K/uL (ref 0.7–4.0)
MCH: 28.5 pg (ref 26.0–34.0)
MCHC: 32.2 g/dL (ref 30.0–36.0)
MCV: 88.7 fL (ref 80.0–100.0)
Monocytes Absolute: 0.4 K/uL (ref 0.1–1.0)
Monocytes Relative: 7 %
Neutro Abs: 4.1 K/uL (ref 1.7–7.7)
Neutrophils Relative %: 71 %
Platelets: 154 K/uL (ref 150–400)
RBC: 3.89 MIL/uL (ref 3.87–5.11)
RDW: 13.9 % (ref 11.5–15.5)
WBC: 5.7 K/uL (ref 4.0–10.5)
nRBC: 0 % (ref 0.0–0.2)

## 2023-11-15 LAB — LACTATE DEHYDROGENASE: LDH: 174 U/L (ref 105–235)

## 2023-11-15 LAB — RETICULOCYTES
Immature Retic Fract: 9.8 % (ref 2.3–15.9)
RBC.: 3.91 MIL/uL (ref 3.87–5.11)
Retic Count, Absolute: 48.9 K/uL (ref 19.0–186.0)
Retic Ct Pct: 1.3 % (ref 0.4–3.1)

## 2023-11-15 LAB — IRON AND TIBC
Iron: 55 ug/dL (ref 28–170)
Saturation Ratios: 19 % (ref 10.4–31.8)
TIBC: 291 ug/dL (ref 250–450)
UIBC: 236 ug/dL

## 2023-11-15 LAB — FERRITIN: Ferritin: 74 ng/mL (ref 11–307)

## 2023-11-15 NOTE — Progress Notes (Signed)
 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 Epifanio.

## 2023-11-15 NOTE — Progress Notes (Signed)
 Hematology/Oncology Consult note [facility] [phone] [phone]  Patient Care Team: Epifanio Alm SQUIBB, MD as PCP - General (Infectious Diseases)   Name of the patient: Cassandra Joyce  [id number]  Dec 19, 1945    Reason for referral-anemia   Referring physician-Dr. Epifanio  Date of visit: 11/15/23   History of presenting illness-patient is a 78 year old female with a past medical history significant for hypertension hyperlipidemia, history of vertigo, osteoarthritis and type 1 diabetes.  She has been referred for anemia.CBC from 11/08/2023 showed white count of 5.8, H&H of 11.7/35.9 with an MCV of 88.9 and a platelet count of 156.  Ferritin levels were low normal at 47.  B12 levels greater than 1500.  Creatinine mildly elevated at 1.3.  Back at her prior CBCs her hemoglobin has been around 11.5 for the last 1 year and prior to that mostly fluctuating around 11.5-12.5 at least dating back to 2015.  She experiences fatigue and decreased energy levels, which have persisted for over a year. She enjoys gardening but finds herself getting tired easily, even when performing activities she used to manage without difficulty. Her energy levels have not returned to baseline following her cardiac surgery last year.  She underwent a triple bypass surgery in August of the previous year after experiencing fatigue and undergoing a stress test and catheterization. Post-surgery, she has been on Lasix. Her creatinine level is slightly elevated at 1.3 mg/dL.  She reports a decreased appetite and altered taste, stating that 'nothing tastes good anymore.' She feels full quickly and has experienced a weight loss from her usual 133 pounds to around 123 pounds, which has remained stable over the past year.     ECOG PS- 1  Pain scale- 0   Review of systems- Review of Systems  Constitutional:  Positive for malaise/fatigue.    Allergies  Allergen Reactions    Augmentin [Amoxicillin-Pot Clavulanate]     Need to clarify reaction   Mercury Swelling   Penicillin G Nausea Only    Need to clarify reaction Has patient had a PCN reaction causing immediate rash, facial/tongue/throat swelling, SOB or lightheadedness with hypotension: No Has patient had a PCN reaction causing severe rash involving mucus membranes or skin necrosis: No Has patient had a PCN reaction that required hospitalization No Has patient had a PCN reaction occurring within the last 10 years: No If all of the above answers are NO, then may proceed with Cephalosporin use.    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 Excised 12/07/2007, margins free.   Cancer [facility])    skin   Diabetes mellitus type 1 (HCC)    Medtronic Medimed Insulin  pump   Dysplastic nevus 04/07/2009   RLQA. Mild to moderate atypia, edges free.   Essential (primary) hypertension    Female dyspareunia    GERD (gastroesophageal reflux disease)    Barret's esphagus   Hematuria    Hyperlipidemia    Hypertension    Impaired hearing    left ear hearing loss, tinnitis   Menopause    Microscopic colitis  Nasal polyps    Osteoarthritis    right hip, back   Seasonal allergies    Sinusitis    Vertigo    none recently   Wears hearing aid in both ears      Past Surgical History:  Procedure Laterality Date   CATARACT EXTRACTION     1 eye, other eye not done yet   CATARACT EXTRACTION W/PHACO Right 12/02/2020   Procedure: CATARACT EXTRACTION PHACO AND INTRAOCULAR LENS PLACEMENT (IOC) RIGHT DIABETIC 14.85 01:41.9;  Surgeon: Mittie Gaskin, MD;  Location: [facility];  Service: Ophthalmology;  Laterality: Right;  Diabetic   COLONOSCOPY     CONVERSION TO TOTAL HIP Right 08/10/2015   Procedure: CONVERSION RIGHT HIP HEMI TO TOTAL HIP ARTHROPLASTY;  Surgeon: Donnice Car, MD;  Location: [facility]S;  Service: Orthopedics;  Laterality: Right;   ESOPHAGOGASTRODUODENOSCOPY N/A 07/11/2014   Procedure: ESOPHAGOGASTRODUODENOSCOPY (EGD);  Surgeon: Lamar ONEIDA Holmes, MD;  Location: [facility] ENDOSCOPY;  Service: Endoscopy;  Laterality: N/A;   ESOPHAGOGASTRODUODENOSCOPY N/A 10/08/2020   Procedure: ESOPHAGOGASTRODUODENOSCOPY (EGD);  Surgeon: Onita Elspeth Sharper, DO;  Location: [facility] ENDOSCOPY;  Service: Gastroenterology;  Laterality: N/A;   ESOPHAGOGASTRODUODENOSCOPY (EGD) WITH PROPOFOL  N/A 08/28/2017   Procedure: ESOPHAGOGASTRODUODENOSCOPY (EGD) WITH PROPOFOL ;  Surgeon: Holmes Lamar ONEIDA, MD;  Location: [facility] ENDOSCOPY;  Service: Endoscopy;  Laterality: N/A;   EYE SURGERY     HERNIA REPAIR Left    inguinal '73   HIP ARTHROPLASTY Right 03/26/2015   Procedure: ARTHROPLASTY BIPOLAR HIP (HEMIARTHROPLASTY);  Surgeon: Kayla Pinal, MD;  Location: [facility] ORS;  Service: Orthopedics;  Laterality: Right;   JOINT REPLACEMENT     RIGHT/LEFT HEART CATH AND CORONARY ANGIOGRAPHY Bilateral 08/12/2022   Procedure: RIGHT/LEFT HEART CATH AND CORONARY ANGIOGRAPHY;  Surgeon: Florencio Cara BIRCH, MD;  Location: [facility] INVASIVE CV LAB;  Service: Cardiovascular;  Laterality: Bilateral;   TONSILLECTOMY     TUBAL LIGATION      Social History   Socioeconomic History   Marital status: Married    Spouse name: Tom Trompeter   Number of children: Not on file   Years of education: Not on file   Highest education level: Not on file  Occupational History   Not on file  Tobacco Use   Smoking status: Never   Smokeless tobacco: Never  Vaping Use   Vaping status: Never Used  Substance and Sexual Activity   Alcohol use: Not Currently    Comment: rare   Drug use: No   Sexual activity: Not  Currently  Other Topics Concern   Not on file  Social History Narrative   Not on file   Social Drivers of Health   Financial Resource Strain: Low Risk  (11/08/2023)   Received from [facility]   Overall Financial Resource Strain (CARDIA)    Difficulty of Paying Living Expenses: Not hard at all  Food Insecurity: No Food Insecurity (11/15/2023)   Hunger Vital Sign    Worried About Running Out of Food in the Last Year: Never true    Ran Out of Food in the Last Year: Never true  Transportation Needs: No Transportation Needs (11/15/2023)   PRAPARE - Administrator, Civil Service (Medical): No    Lack of Transportation (Non-Medical): No  Physical Activity: Not on file  Stress: Not on file  Social Connections: Not on file  Intimate Partner Violence: Not At Risk (11/15/2023)   Humiliation, Afraid, Rape, and Kick questionnaire    Fear of Current or Ex-Partner: No  Emotionally Abused: No    Physically Abused: No    Sexually Abused: No     Family History  Problem Relation Age of Onset   Diabetes Mellitus II Mother    Cancer Father    Diabetes Mellitus II Sister    Alzheimer's disease Sister    Breast cancer Cousin      Current Outpatient Medications:    acetaminophen  (TYLENOL ) 500 MG tablet, Take 2 tablets (1,000 mg total) by mouth every 8 (eight) hours., Disp: 30 tablet, Rfl: 0   aspirin  (ASPIRIN  81) 81 MG chewable tablet, , Disp: , Rfl:    Azelastine HCl 137 MCG/SPRAY SOLN, SMARTSIG:1-2 Spray(s) Both Nares Every 12 Hours PRN, Disp: , Rfl:    B Complex Vitamins (VITAMIN B COMPLEX) TABS, Take 1 tablet by mouth daily., Disp: , Rfl:    Calcium  Carb-Cholecalciferol  (CALCIUM  HIGH POTENCY/VITAMIN D ) 600-5 MG-MCG TABS, Take by mouth., Disp: , Rfl:    Continuous Glucose Sensor (DEXCOM G6 SENSOR) MISC, Use 1 each every 10 (ten) days, Disp: , Rfl:    Continuous Glucose Transmitter (DEXCOM G6 TRANSMITTER) MISC, See admin instructions., Disp: , Rfl:     enalapril  (VASOTEC ) 2.5 MG tablet, Take 2.5 mg by mouth., Disp: , Rfl:    Enoxaparin  Sodium (LOVENOX  IJ), Lovenox , Disp: , Rfl:    fluticasone  (FLONASE ) 50 MCG/ACT nasal spray, Place 2 sprays into both nostrils daily., Disp: , Rfl:    furosemide (LASIX) 20 MG tablet, PLEASE SEE ATTACHED FOR DETAILED DIRECTIONS, Disp: , Rfl:    hydrochlorothiazide (HYDRODIURIL) 12.5 MG tablet, Take 12.5 mg by mouth daily., Disp: , Rfl:    insulin  lispro (HUMALOG) 100 UNIT/ML injection, Inject 30 Units into the skin once. Pt uses insulin  pump-uses 100 units every 3 days, Disp: , Rfl:    KLOR-CON M10 10 MEQ tablet, PLEASE SEE ATTACHED FOR DETAILED DIRECTIONS, Disp: , Rfl:    metoprolol succinate (TOPROL-XL) 25 MG 24 hr tablet, Take 12.5 mg by mouth at bedtime., Disp: , Rfl:    omeprazole  (PRILOSEC) 40 MG capsule, Take 40 mg by mouth daily., Disp: , Rfl:    ondansetron  (ZOFRAN -ODT) 4 MG disintegrating tablet, Take 4 mg by mouth every 8 (eight) hours as needed., Disp: , Rfl:    oxyCODONE (OXY IR/ROXICODONE) 5 MG immediate release tablet, Take 5 mg by mouth every 6 (six) hours as needed., Disp: , Rfl:    polyethylene glycol (MIRALAX  / GLYCOLAX ) 17 g packet, Take by mouth., Disp: , Rfl:    Potassium Chloride ER 20 MEQ TBCR, Take by mouth., Disp: , Rfl:    senna-docusate (SENOKOT-S) 8.6-50 MG tablet, Take by mouth., Disp: , Rfl:    triamcinolone ointment (KENALOG) 0.1 %, Apply topically 2 (two) times daily., Disp: , Rfl:    Alpha-Lipoic Acid 200 MG TABS, Take by mouth daily. (Patient not taking: Reported on 11/15/2023), Disp: , Rfl:    ASPIRIN  81 PO, Take by mouth daily. (Patient not taking: Reported on 04/03/2023), Disp: , Rfl:    calcium  carbonate (OSCAL) 1500 (600 Ca) MG TABS tablet, Take by mouth 2 (two) times daily with a meal. (Patient not taking: Reported on 04/03/2023), Disp: , Rfl:    Cholecalciferol  1.25 MG (50000 UT) TABS, , Disp: , Rfl:    clindamycin  (CLEOCIN ) 150 MG capsule, TAKE 4 CAPS 1HR PRIOR TO DENTAL  APPT WITH FOOD (Patient not taking: Reported on 04/03/2023), Disp: , Rfl:    Continuous Glucose Sensor (DEXCOM G7 SENSOR) MISC, Use 1 Device every 10 (ten) days (Patient  not taking: Reported on 04/03/2023), Disp: , Rfl:    cyanocobalamin 1000 MCG tablet, Take 1,000 mcg by mouth daily. (Patient not taking: Reported on 04/03/2023), Disp: , Rfl:    Enoxaparin  Sodium (LOVENOX  IJ), , Disp: , Rfl:    fluocinonide  (LIDEX ) 0.05 % external solution, Apply to aa of scalp qd/bid prn for itch bumps (Patient not taking: Reported on 11/15/2023), Disp: 60 mL, Rfl: 3   HYDROcodone -acetaminophen  (NORCO/VICODIN) 5-325 MG tablet, 1 PO Q 6hrs prn pain (Patient not taking: Reported on 11/15/2023), Disp: , Rfl:    influenza vaccine adjuvanted (FLUAD) 0.5 ML injection, Fluad 2016-17 41yr up(PF)45 mcg(15 mcgx3)/0.5 mL intramuscular syringe  ADM 0.5ML IM UTD (Patient not taking: Reported on 04/03/2023), Disp: , Rfl:    influenza vaccine adjuvanted (FLUAD) 0.5 ML injection, ADM 0.5ML IM UTD (Patient not taking: Reported on 04/03/2023), Disp: , Rfl:    Insulin  Glargine (BASAGLAR KWIKPEN) 100 UNIT/ML, 10 units daily when off pump (Patient not taking: Reported on 11/15/2023), Disp: , Rfl:    isosorbide  mononitrate (IMDUR ) 30 MG 24 hr tablet, Take 30 mg by mouth daily. (Patient not taking: Reported on 11/15/2023), Disp: , Rfl:    KLOR-CON M20 20 MEQ tablet, Take by mouth. (Patient not taking: Reported on 11/15/2023), Disp: , Rfl:    metoCLOPramide  (REGLAN ) 10 MG tablet, Take 10 mg by mouth 3 (three) times daily. (Patient not taking: Reported on 11/15/2023), Disp: , Rfl:    metolazone (ZAROXOLYN) 2.5 MG tablet, Take by mouth. (Patient not taking: Reported on 11/15/2023), Disp: , Rfl:    metoprolol tartrate (LOPRESSOR) 25 MG tablet, Take 25 mg by mouth 2 (two) times daily. (Patient not taking: Reported on 11/15/2023), Disp: , Rfl:    mometasone (NASONEX) 50 MCG/ACT nasal spray, Place 2 sprays into the nose daily. (Patient not taking:  Reported on 11/15/2023), Disp: , Rfl:    nitroGLYCERIN (NITROSTAT) 0.4 MG SL tablet, PLEASE SEE ATTACHED FOR DETAILED DIRECTIONS (Patient not taking: Reported on 11/15/2023), Disp: , Rfl:    ondansetron  (ZOFRAN ) 4 MG tablet, Take 2 tablets twice a day by oral route. (Patient not taking: Reported on 04/03/2023), Disp: , Rfl:    rosuvastatin  (CRESTOR ) 40 MG tablet, Take 40 mg by mouth daily. (Patient not taking: Reported on 11/15/2023), Disp: , Rfl:    Sennosides (SENNA) 8.6 MG CAPS, senna (Patient not taking: Reported on 04/03/2023), Disp: , Rfl:    Sennosides (SENNA-TIME PO), senna (Patient not taking: Reported on 04/03/2023), Disp: , Rfl:    Sennosides (SENNA-TIME PO), senna (Patient not taking: Reported on 04/03/2023), Disp: , Rfl:    Sennosides (SENNA-TIME PO), , Disp: , Rfl:    simvastatin  (ZOCOR ) 40 MG tablet, Take 40 mg by mouth daily. (Patient not taking: Reported on 11/15/2023), Disp: , Rfl:    Physical exam: There were no vitals filed for this visit. Physical Exam Cardiovascular:     Rate and Rhythm: Normal rate and regular rhythm.     Heart sounds: Normal heart sounds.  Pulmonary:     Effort: Pulmonary effort is normal.     Breath sounds: Normal breath sounds.  Abdominal:     General: Bowel sounds are normal.     Palpations: Abdomen is soft.  Skin:    General: Skin is warm and dry.  Neurological:     Mental Status: She is alert and oriented to person, place, and time.           Latest Ref Rng & Units 06/26/2023    8:38 AM  CMP  Glucose 70 - 99 mg/dL 688   BUN 8 - 23 mg/dL 35   Creatinine 9.55 - 1.00 mg/dL 8.90   Sodium 864 - 854 mmol/L 136   Potassium 3.5 - 5.1 mmol/L 3.4   Chloride 98 - 111 mmol/L 108   CO2 22 - 32 mmol/L 20   Calcium  8.9 - 10.3 mg/dL 9.3   Total Protein 6.5 - 8.1 g/dL 5.8   Total Bilirubin 0.0 - 1.2 mg/dL 0.9   Alkaline Phos 38 - 126 U/L 54   AST 15 - 41 U/L 45   ALT 0 - 44 U/L 37       Latest Ref Rng & Units 06/26/2023    9:14 AM  CBC  WBC  4.0 - 10.5 K/uL 7.8   Hemoglobin 12.0 - 15.0 g/dL 88.5   Hematocrit 63.9 - 46.0 % 35.3   Platelets 150 - 400 K/uL 122      Assessment and plan- Patient is a 77 y.o. female referred for normocytic anemia     Mild anemia with associated fatigue and decreased appetite Mild anemia with hemoglobin at 11.7. Normal white blood cell count and platelets. Borderline low ferritin at 47 suggests possible iron deficiency. Fatigue and decreased appetite may relate to anemia, but improvement with iron infusion is uncertain. Explained IV iron is generally well-tolerated, with rare allergic or anaphylactic reactions. - I will check CBC ferritin and iron studies reticulocyte count LDH and myeloma panel today.- If iron levels are convincingly low, will consider IV iron infusion. - If blood tests are normal, will monitor hemoglobin levels in four months. - Will call with results of blood tests.      Thank you for this kind referral and the opportunity to participate in the care of this patient   Visit Diagnosis 1. Normocytic anemia     Dr. Annah Skene, MD, MPH [facility] [phone] 11/15/2023

## 2023-11-16 LAB — KAPPA/LAMBDA LIGHT CHAINS
Kappa free light chain: 27 mg/L — ABNORMAL HIGH (ref 3.3–19.4)
Kappa, lambda light chain ratio: 1.19 (ref 0.26–1.65)
Lambda free light chains: 22.6 mg/L (ref 5.7–26.3)

## 2023-11-20 LAB — MULTIPLE MYELOMA PANEL, SERUM
Albumin SerPl Elph-Mcnc: 3.3 g/dL (ref 2.9–4.4)
Albumin/Glob SerPl: 1.5 (ref 0.7–1.7)
Alpha 1: 0.2 g/dL (ref 0.0–0.4)
Alpha2 Glob SerPl Elph-Mcnc: 0.7 g/dL (ref 0.4–1.0)
B-Globulin SerPl Elph-Mcnc: 0.8 g/dL (ref 0.7–1.3)
Gamma Glob SerPl Elph-Mcnc: 0.6 g/dL (ref 0.4–1.8)
Globulin, Total: 2.3 g/dL (ref 2.2–3.9)
IgA: 138 mg/dL (ref 64–422)
IgG (Immunoglobin G), Serum: 666 mg/dL (ref 586–1602)
IgM (Immunoglobulin M), Srm: 81 mg/dL (ref 26–217)
Total Protein ELP: 5.6 g/dL — ABNORMAL LOW (ref 6.0–8.5)

## 2024-03-18 ENCOUNTER — Inpatient Hospital Stay

## 2024-03-18 ENCOUNTER — Inpatient Hospital Stay: Admitting: Oncology

## 2024-04-08 ENCOUNTER — Encounter: Admitting: Dermatology
